# Patient Record
Sex: Female | Born: 1960 | Race: Black or African American | Hispanic: No | Marital: Married | Smoking: Never smoker
Health system: Southern US, Community
[De-identification: ages and names within clinical notes are randomized; demographics above are authoritative.]

## PROBLEM LIST (undated history)

## (undated) DIAGNOSIS — I10 Essential (primary) hypertension: Secondary | ICD-10-CM

## (undated) DIAGNOSIS — G43909 Migraine, unspecified, not intractable, without status migrainosus: Secondary | ICD-10-CM

## (undated) DIAGNOSIS — R7303 Prediabetes: Secondary | ICD-10-CM

## (undated) DIAGNOSIS — E559 Vitamin D deficiency, unspecified: Secondary | ICD-10-CM

## (undated) DIAGNOSIS — E785 Hyperlipidemia, unspecified: Secondary | ICD-10-CM

## (undated) DIAGNOSIS — E669 Obesity, unspecified: Secondary | ICD-10-CM

## (undated) DIAGNOSIS — F32A Depression, unspecified: Secondary | ICD-10-CM

## (undated) DIAGNOSIS — F329 Major depressive disorder, single episode, unspecified: Secondary | ICD-10-CM

## (undated) DIAGNOSIS — T7840XA Allergy, unspecified, initial encounter: Secondary | ICD-10-CM

## (undated) HISTORY — DX: Allergy, unspecified, initial encounter: T78.40XA

## (undated) HISTORY — DX: Prediabetes: R73.03

## (undated) HISTORY — DX: Major depressive disorder, single episode, unspecified: F32.9

## (undated) HISTORY — DX: Vitamin D deficiency, unspecified: E55.9

## (undated) HISTORY — DX: Depression, unspecified: F32.A

## (undated) HISTORY — DX: Migraine, unspecified, not intractable, without status migrainosus: G43.909

## (undated) HISTORY — DX: Obesity, unspecified: E66.9

## (undated) HISTORY — DX: Hyperlipidemia, unspecified: E78.5

## (undated) HISTORY — PX: ABDOMINAL HYSTERECTOMY: SHX81

## (undated) HISTORY — DX: Essential (primary) hypertension: I10

---

## 1998-03-19 ENCOUNTER — Other Ambulatory Visit: Admission: RE | Admit: 1998-03-19 | Discharge: 1998-03-19 | Payer: Self-pay | Admitting: Gynecology

## 1998-05-03 HISTORY — PX: STAPEDECTOMY: SHX2435

## 1998-10-27 ENCOUNTER — Other Ambulatory Visit: Admission: RE | Admit: 1998-10-27 | Discharge: 1998-10-27 | Payer: Self-pay | Admitting: Otolaryngology

## 1998-10-27 ENCOUNTER — Encounter (INDEPENDENT_AMBULATORY_CARE_PROVIDER_SITE_OTHER): Payer: Self-pay | Admitting: Specialist

## 1999-03-24 ENCOUNTER — Other Ambulatory Visit: Admission: RE | Admit: 1999-03-24 | Discharge: 1999-03-24 | Payer: Self-pay | Admitting: Gynecology

## 2000-01-18 ENCOUNTER — Other Ambulatory Visit: Admission: RE | Admit: 2000-01-18 | Discharge: 2000-01-18 | Payer: Self-pay | Admitting: *Deleted

## 2000-03-15 ENCOUNTER — Inpatient Hospital Stay (HOSPITAL_COMMUNITY): Admission: RE | Admit: 2000-03-15 | Discharge: 2000-04-19 | Payer: Self-pay | Admitting: Obstetrics & Gynecology

## 2000-03-15 ENCOUNTER — Encounter: Payer: Self-pay | Admitting: Obstetrics & Gynecology

## 2000-03-18 ENCOUNTER — Encounter: Payer: Self-pay | Admitting: Obstetrics and Gynecology

## 2000-03-21 ENCOUNTER — Encounter: Payer: Self-pay | Admitting: Obstetrics & Gynecology

## 2000-03-28 ENCOUNTER — Encounter: Payer: Self-pay | Admitting: Obstetrics & Gynecology

## 2000-04-12 ENCOUNTER — Encounter: Payer: Self-pay | Admitting: Obstetrics and Gynecology

## 2000-04-15 ENCOUNTER — Encounter: Payer: Self-pay | Admitting: Obstetrics and Gynecology

## 2000-04-20 ENCOUNTER — Encounter: Admission: RE | Admit: 2000-04-20 | Discharge: 2000-05-10 | Payer: Self-pay | Admitting: Obstetrics and Gynecology

## 2000-04-20 ENCOUNTER — Encounter: Admission: RE | Admit: 2000-04-20 | Discharge: 2000-07-19 | Payer: Self-pay | Admitting: Obstetrics and Gynecology

## 2001-04-10 ENCOUNTER — Other Ambulatory Visit: Admission: RE | Admit: 2001-04-10 | Discharge: 2001-04-10 | Payer: Self-pay | Admitting: Gynecology

## 2002-04-11 ENCOUNTER — Other Ambulatory Visit: Admission: RE | Admit: 2002-04-11 | Discharge: 2002-04-11 | Payer: Self-pay | Admitting: Gynecology

## 2003-05-20 ENCOUNTER — Other Ambulatory Visit: Admission: RE | Admit: 2003-05-20 | Discharge: 2003-05-20 | Payer: Self-pay | Admitting: Gynecology

## 2004-06-04 ENCOUNTER — Other Ambulatory Visit: Admission: RE | Admit: 2004-06-04 | Discharge: 2004-06-04 | Payer: Self-pay | Admitting: Gynecology

## 2005-07-20 ENCOUNTER — Other Ambulatory Visit: Admission: RE | Admit: 2005-07-20 | Discharge: 2005-07-20 | Payer: Self-pay | Admitting: Gynecology

## 2005-08-26 ENCOUNTER — Encounter (INDEPENDENT_AMBULATORY_CARE_PROVIDER_SITE_OTHER): Payer: Self-pay | Admitting: *Deleted

## 2005-08-26 ENCOUNTER — Inpatient Hospital Stay (HOSPITAL_COMMUNITY): Admission: RE | Admit: 2005-08-26 | Discharge: 2005-08-28 | Payer: Self-pay | Admitting: *Deleted

## 2007-02-02 ENCOUNTER — Other Ambulatory Visit: Admission: RE | Admit: 2007-02-02 | Discharge: 2007-02-02 | Payer: Self-pay | Admitting: Gynecology

## 2008-05-06 LAB — HM COLONOSCOPY

## 2009-05-06 LAB — HM DEXA SCAN: HM DEXA SCAN: NORMAL

## 2010-01-19 ENCOUNTER — Ambulatory Visit: Payer: Self-pay | Admitting: Internal Medicine

## 2010-01-19 ENCOUNTER — Ambulatory Visit: Payer: Self-pay

## 2010-01-19 ENCOUNTER — Ambulatory Visit (HOSPITAL_COMMUNITY): Admission: RE | Admit: 2010-01-19 | Discharge: 2010-01-19 | Payer: Self-pay | Admitting: Internal Medicine

## 2010-01-20 ENCOUNTER — Encounter: Payer: Self-pay | Admitting: Internal Medicine

## 2010-09-18 NOTE — Discharge Summary (Signed)
[facility]  Patient:    Laura Branch, Laura Branch                      MRN: [id number] Adm. Date:  03/15/00 Disc. Date: 04/19/00 Attending:  Janine Limbo, M.D. Dictator:   Nigel Bridgeman, C.N.M.                           Discharge Summary  ADMITTING DIAGNOSES:          1. Intrauterine pregnancy at 20 weeks.                               2. Premature labor with cervical dilatation.  DISCHARGE DIAGNOSES:          1. At 24 3/7 weeks.                               2. Preterm labor.                               3. Oligohydramnios.                               4. Breech presentation.                               5. Placental abruption.                               6. Fetal distress.                               7. Intrauterine growth retardation.                               8. Chorioamnionitis.                               9. Prolonged hospitalization.  PROCEDURE:                    1. Primary low transverse cesarean section.                               2. General anesthesia.                               3. Magnesium sulfate therapy.                               4. PICC line placement.                               5. Betamethasone therapy.  HOSPITAL COURSE:              Ms. Cancel was a 50 year old gravida 4, para  0-0-3-0 who was admitted on March 15, 2000 at 20 3/7 weeks with dilatation of the cervix to approximately 1.5 cm, 100%, bulging bag of water.  Pregnancy had been remarkable for advanced maternal age, increased risk of Trisomy 18 on AFP (declined amniocentesis), abnormal Pap with a history of cryo, greater than two TABs.  Patient was placed on complete bed rest.  On the day of admission Unasyn antibiotic was begun.  She was treated for bacterial vaginosis in the early days of her treatment.  A PICC line was placed due to anticipated long-term IV therapy needs.  Magnesium sulfate was continued. Cervix was examined digitally on  November 18 and was found to be 3 cm, 90% with bulging membranes.  Cerclage was then declined as an option.  Patient had a negative urine C&S on November 24.  Group B strep culture on November 13 was negative.  She began on December 2 to have some slight amount of bleeding. She began to have sporadic episodes of increased contractions.  Clotting studies were evaluated on December 3 with slight elevation of the fibrin split products.  Reevaluation was held on the sixth with fibrin split products present at 1.46.  Fibrinogen was within normal limits.  Several discussions were held with patient throughout her hospital stay regarding her desire for management plan.  She had elected a cesarean section should fetal distress ensue.  She did have some issues of constipation.  December 9 clotting studies were reevaluated.  There was some elevation of her PT and D-dimer levels. Diagnosis was made of a probable partial abruption.  Ultrasound was performed which showed no measurable cervix, estimated fetal weight less than the 10th percentile, oligohydramnios.  Patient was continued on magnesium sulfate.  On April 15, 2000 she began to have some changes in fetal heart rate.  She began to have heavier bleeding and the onset of abdominal pain.  The decision was made to proceed with cesarean section.  This was performed by Dr. Marline Backbone on April 15, 2000 under general anesthesia secondary to fetal distress.  A complete abruption was documented.  There was a viable female. Weight 1 pound 6 ounces.  Apgars were 1, 5, and 6.  Estimated blood loss was 700 cc.  Infant was intubated and taken to the NICU in critical condition. Patient was taken to the recovery room in good condition.  On postoperative day #1 her clotting studies were within normal limits except for the D-dimer which was 8.32.  Her physical examination was within normal limits.  She had had approximately a month and a half of bed  rest, therefore her mobility was significantly compromised.  Her hemoglobin was 8.8, white blood cell count 14.8.  Patient declined transfusion.  PT consult was held.  Patient did have a temperature to 101.4 on December 15 late in the evening and Cefotan was begun for antibiotic coverage.  By postoperative day #3 patient had been afebrile after a temperature max of 101.5.  Her incision was clean, dry, and intact. Physical therapy was consulted.  Patient was beginning to walk with wheelchair support or walker support.  She was able to bear weight and was able to get herself back and forth to the bathroom.  Dr. Stefano Gaul came in to see the patient on April 19, 2000.  Physical examination was within normal limits. Incision was clean, dry, and intact.  Patient was pumping for breast milk. She had elected Ortho-Tri-Cyclen for contraception which she will start after she stops pumping.  Decision was made that the patient had received the full benefit of her hospital stay and was discharged home.  DISCHARGE INSTRUCTIONS:       Per [facility] handout.  DISCHARGE MEDICATIONS:        1. Motrin 600 mg p.o. q.6h. p.r.n. pain.                               2. Tylox one to two p.o. q.3-4h. p.r.n. pain.                               3. Hemocyte one p.o. b.i.d.                               4. Augmentin 500 mg one p.o. t.i.d. x 8 days.  DISCHARGE FOLLOW-UP:          With physical therapy on April 20, 2000 per order of Dr. Stefano Gaul.  Discharge follow-up will occur at [facility] in six weeks.DD:  04/19/00 TD:  04/19/00 Job: [id number]

## 2010-09-18 NOTE — H&P (Signed)
[facility]  Patient:    Laura Branch, Laura Branch                    MRN: [id number] Adm. Date:  [id number] Attending:  Cleatrice Burke Dictator:   Vance Gather Duplantis, C.N.M.                         History and Physical  HISTORY OF PRESENT ILLNESS:   Laura Branch is a 50 year old married black female, gravida 4, para 0-0-3-0 at 20 weeks by LMP and 20-3/7 weeks by todays ultrasound who presents for:  1. Evaluation from ultrasound department secondary to being found to be anywhere from 1 to 3 cm dilated by ultrasound and 100% effaced.  The patient denies any uterine cramping, pressure, leaking, or bleeding.  She denies any nausea, vomiting, headache, or visual disturbances.  Her pregnancy has been followed at [facility] to date by the CNM service and has been at risk for advanced maternal age with no amniocentesis.  2. History of increased trisomy 18 risk on alpha-fetoprotein, declining amniocentesis.  3. Abnormal pap with a history of cryosurgery in 1991.  4. Greater than two abortions.  OBSTETRIC-GYNECOLOGIC HISTORY:                      She is a gravida 4, para 0-0-3-0 who had a spontaneous AB in 1995 at [redacted] weeks gestation, an elective AB in 1997 at 6 weeks, and a second elective AB in 1999 at 6 weeks.  For gynecologic history, her menarche was at age 75, her last menstrual period was October 27, 1999, giving her an [facility] of August 02, 2000.  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  GENERAL MEDICAL HISTORY:      She has no known drug allergies.  She reports having had the usual childhood diseases.  She has no other medical problems. Her only surgeries include right oophorectomy, exploratory laparoscopy, stapedectomy on her right ear.  FAMILY HISTORY:               Noncontributory though she has a father  with diabetes on oral medication and multiple relatives with different kinds of cancer, colon cancer, lung cancer, and pancreatic cancer.  GENETIC HISTORY:              Negative with the exception that she is over age 83 and declines an amniocentesis.  PRENATAL LABORATORIES:        Not currently available.  SOCIAL HISTORY:               She is married to Laura Branch who is involved and supportive.  They are of the [facility] faith.  They deny any illicit drug use, alcohol, or smoking with this pregnancy.  They are both employed full-time.  PHYSICAL EXAMINATION:  VITAL SIGNS:                  Stable.  She is afebrile.  HEENT:                        Grossly within normal limits.  HEART:                        Regular rhythm and rate.  CHEST:  Clear.  BREASTS:                      Soft and nontender.  ABDOMEN:                      Gravid with the fundal height at her umbilicus. Fetal heart rate is in the 150s.  Uterine contractions are noted approximately every 10 to 12 minutes.  PELVIC:                       Her sterile speculum exam was significant for white, mucusy discharge in the vault that is nitrazine negative but fern positive.  Cervix was about 1.5 cm dilated, 100% effaced, with bulging bag of forewaters noted but not extruding through the cervix.  EXTREMITIES:                  Within normal limits.  LABORATORY DATA:              GC, Chlamydia, group B strep, and wet prep are pending.  CBC ______ differential, urinalysis, and culture are also pending.                                Her ultrasound reveals a single intrauterine pregnancy in breech presentation at 20 weeks and three days with, again, no cervical length noted and dilated anywhere from 0.9 cm to 3 cm with fibroids also noted, 3 x 3 cm for the first one and 3 x 3 also for the second one.  Her amniotic fluid pocket is 4.3 cm.  ASSESSMENT:                   1. Intrauterine pregnancy at  approximately [redacted]                                  weeks gestation by last menstrual period and                                  confirmed by ultrasound today.                               2. Preterm labor versus incompetent cervix and                                  spontaneous rupture of membranes,                                  questionably high leak.  PLAN:                         Admit to labor and delivery, to place her in deep Trendelenburg, to give her Motrin for tocolysis, Unasyn for prophylaxis against Guillain-Barre syndrome, and other orders are on the order sheet per Dr. Elliot Gault. DD:  03/15/00 TD:  03/15/00 Job: [id number]

## 2010-09-18 NOTE — H&P (Signed)
NAME:  Laura Branch, Laura Branch NO.:  [id number]   MEDICAL RECORD NO.:  [id number]          PATIENT TYPE:  AMB   LOCATION:  SDC                           FACILITY:  [facility]   PHYSICIAN:  Almedia Balls. Fore, M.D.   DATE OF BIRTH:  10-22-1960   DATE OF ADMISSION:  08/26/2005  DATE OF DISCHARGE:                                HISTORY & PHYSICAL   CHIEF COMPLAINT:  Abnormal bleeding, pelvic pain, uterine enlargement.   HISTORY:  Patient is a 50 year old gravida 5, para 1 with one premature  birth, two miscarriages, and two spontaneous abortions, who was admitted for  hysterectomy and right salpingo-oophorectomy.  She has had progressively  severe menses and progressively increasing size of her uterus over the past  several years and has been followed by Dr. Chevis Pretty, who referred her to [place] for  definitive surgery.  He has performed Pap smears and endometrial biopsies,  which were normal, recently.  Hemoglobin in March was 12.7.  She has been  counseled as to the nature of the hysterectomy procedure and the risks  involved, including the risk of anesthesia, injury to bowel, bladder, blood  vessels, ureters, postoperative hemorrhage, infection, recuperation, and  hormone replacement following removal of the right ovary, which she desires.  She fully understands all of these considerations and wishes to proceed on  August 26, 2005.   PAST MEDICAL HISTORY:  1.  Removal of an ovarian cyst in 1987.  2.  Removal of her left ovary and tube in June, 1995.  3.  C-section in December, 2001.  4.  Eye surgery in April, 2006.   She takes hydrochlorothiazide 25 mg a day for some mild hypertension and  Zyrtec, Nasonex, and Patanol for allergies.   She is allergic to no medications.   FAMILY HISTORY:  Father and grandmother with diabetes mellitus.  Both  parents with hypertension.  Grandmother with carcinoma of the pancreas and  grandfather with carcinoma of the colon.   REVIEW OF SYSTEMS:   HEENT:  Wear glasses.  Some decrease in hearing.  CARDIORESPIRATORY:  Hypertension, mild, as noted above.  GASTROINTESTINAL:  Negative.  GENITOURINARY:  As noted above.  NEUROMUSCULAR:  Negative.   PHYSICAL EXAMINATION:  VITAL SIGNS:  Height 5 feet 3 and 3/4 inches.  Weight  219 pounds.  Blood pressure 124/70, pulse 80, respirations 18.  GENERAL:  A well-developed black female in no acute distress.  HEENT:  Within normal limits.  NECK:  Supple without masses, adenopathy, or bruits.  LUNGS:  Clear to P&A.  HEART:  Regular rate and rhythm without murmurs.  BREASTS:  Sitting and lying without mass.  Axilla negative.  ABDOMEN:  Flat and soft with vertical and transverse incisions, which are  well healed.  There is no palpable mass except for some fullness in the  central lower abdomen with some tenderness bilaterally, right greater than  left.  PELVIC:  External genitalia, Bartholin, urethra, and Skene glands within  normal limits.  Cervix is slightly inflamed.  Uterus is partially 12-[redacted]  weeks gestation size.  Irregular and tender on manipulation and palpation.  Adnexal exam is tender bilaterally with no palpable masses.  Rectovaginal  confirms.  EXTREMITIES:  Within normal limits.  CENTRAL NERVOUS SYSTEM:  Grossly intact.  SKIN:  Without suspicious lesions.   IMPRESSION:  Abnormal uterine bleeding, pelvic pain, probable fibroids.   DISPOSITION:  As noted above.           ______________________________  Almedia Balls. Randell Patient, M.D.     SRF/MEDQ  D:  08/12/2005  T:  08/12/2005  Job:  [id number]

## 2010-09-18 NOTE — Discharge Summary (Signed)
NAME:  Laura Branch, Laura Branch             ACCOUNT NO.:  [id number]   MEDICAL RECORD NO.:  [id number]          PATIENT TYPE:  INP   LOCATION:  9317                          FACILITY:  [facility]   PHYSICIAN:  Almedia Balls. Fore, M.D.   DATE OF BIRTH:  [birth year]   DATE OF ADMISSION:  08/26/2005  DATE OF DISCHARGE:  08/28/2005                                 DISCHARGE SUMMARY   HISTORY:  The patient is a 50 year old with abnormal uterine bleeding,  uterine enlargement, pelvic pain, status post left oophorectomy and cesarean  section for hysterectomy, right salpingo-oophorectomy at her request on  August 26, 2005. The remainder of her history and physical are as previously  dictated.   Laboratory data include preoperative hemoglobin of 12.7 with MCV low at  75.2, platelets high at 431,000. Potassium somewhat low at 3.2 mEq/L.  Chloride somewhat low at 95 mEq/L.   HOSPITAL COURSE:  The patient was taken to the operating room on August 26, 2005; at which time, abdominal supracervical hysterectomy, right salpingo-  oophorectomy, left salpingectomy, extensive enterolysis and revision of  keloid scar were done. The patient did well postoperatively. Diet and  ambulation were progressed several days postoperatively. She was maintained  in the hospital on August 27, 2005 because of extreme pain which she felt she  could not handle at home.   On the morning of August 28, 2005, she was improved with her pain, afebrile  and experiencing no other problems and it was felt that she could be  discharged.   FINAL DIAGNOSES:  1.  Abnormal uterine bleeding.  2.  Pelvic pain.  3.  Pelvic adhesions, status post left oophorectomy.  4.  Status post cesarean section.  5.  Keloid of abdomen.   OPERATION:  Abdominal supracervical hysterectomy, right salpingo-  oophorectomy, left salpingectomy, extensive enterolysis, revision of keloid.  Pathology report unavailable at the time of dictation.   DISPOSITION:  Discharge to  home to return to the office in two weeks for  follow-up. She was instructed to gradually progress her activities over  several weeks at home and to limit lifting and driving for two weeks. She  was  fully ambulatory, on a regular diet and in good condition at the time of  discharge. She was given a prescription for Percocet 10/325 mg #30 to be  taken 1 q.4-6h. p.r.n. pain and doxycycline 100 mg #12 to be taken 1 b.i.d.  as well as Vivelle 0.05 patches for two weeks with samples provided to be  changed twice a week.           ______________________________  Almedia Balls. Randell Patient, M.D.     SRF/MEDQ  D:  08/28/2005  T:  08/29/2005  Job:  [id number]   cc:   Leatha Gilding. Mezer, M.D.  Fax: [phone]

## 2010-09-18 NOTE — Op Note (Signed)
[facility]  Patient:    OTELIA, HETTINGER                    MRN: [id number] Proc. Date: 04/15/00 Adm. Date:  [id number] Attending:  Dierdre Forth Pearline                           Operative Report  PREOPERATIVE DIAGNOSIS:       24-1/[redacted] week gestation.  Preterm labor. Oligohydramnios.  Breech presentation.  Placental abruption.  Nonreassuring fetal heart rate tracing. Intrauterine growth retardation.  POSTOPERATIVE DIAGNOSIS:      24-1/[redacted] week gestation.  Preterm labor. Oligohydramnios.  Breech presentation.  Placental abruption.  Nonreassuring fetal heart rate tracing.  Intrauterine growth retardation.  OPERATION:                    Stat primary low transverse cesarean section.  SURGEON:                      Janine Limbo, M.D.  ASSISTANT:                    Miguel Dibble, C.N.M.  ANESTHESIA:                   General anesthesia.  ESTIMATED BLOOD LOSS:  INDICATIONS:                  Ms. Luepke is a 50 year old female, gravida 4, para 0-0-3-0, who was admitted to [facility] [place] on March 15, 2000, with preterm labor.  She has been in Trendelenburg position since that time.  She was given magnesium, ibuprofen, and Terbutaline to arrest her labor.  She has received betamethasone.  The patient has had uterine bleeding and then on this day the patient had much heavier bleeding and the onset of abdominal pain.  An ultrasound has confirmed oligohydramnios as well as intrauterine growth retardation.  We discussed the plan for delivery prior to today and we confirmed the patients wishes when the patient began having heavier bleeding.  She requested that we proceed immediately to cesarean delivery.  FINDINGS:  The weight of the infant is currently not known, although, my estimate for the weight is approximately 1-1/4 pounds.  The apgars of the infant are currently not known.  The infant was intubated immediately and taken to  the neonatal intensive care unit via the isolette.  There was a complete placental abruption present.  DESCRIPTION OF PROCEDURE:     The patient was taken to the operating room where a general anesthesia was given.  The patients abdomen was prepped with multiple layers of Betadine.  A Foley catheter had previously been placed. The patient was sterilely draped.  After the adequate induction of her general anesthesia, a low transverse incision was made in the abdomen and carried sharply through the subcutaneous tissue, the fascia, and the anterior peritoneum.  An incision was made in the lower uterine segment and extended transversely.  A complete placental abruption was encountered.  The infant was delivered without difficulty.  The cord was clamped and cut and the infant was handed to the awaiting pediatric team.  The placenta was removed.  The uterine cavity was cleaned of amniotic fluid and clotted blood.  The uterine incision was closed using a running locking suture of 2-0 Vicryl.  Hemostasis was adequate.  The pericolonic gutters were cleaned of  amniotic fluid and clotted blood.  The pelvis was irrigated.  The anterior peritoneum and the abdominal musculature were reapproximated in the midline.  The fascia was closed using a running suture of 0 Vicryl followed by three interrupted sutures of 0 Vicryl. The skin was reapproximated using skin staples.  Sponge, needle, and instrument counts were correct x 2 occasions.  The estimated blood loss was 700 cc.  The patient tolerated her procedure well.  The patient was awakened from her anesthetic and taken to the recovery room in stable condition.  The infant was taken to the intensive care nursery in guarded condition.  The patient was noted to drain clear, yellow urine at the end of our procedure. DD:  04/15/00 TD:  04/16/00 Job: [id number]

## 2010-09-18 NOTE — Op Note (Signed)
NAME:  Laura Branch, Laura Branch NO.:  [id number]   MEDICAL RECORD NO.:  [id number]          PATIENT TYPE:  INP   LOCATION:  9399                          FACILITY:  [facility]   PHYSICIAN:  Almedia Balls. Fore, M.D.   DATE OF BIRTH:  [birth year]   DATE OF PROCEDURE:  08/26/2005  DATE OF DISCHARGE:                                 OPERATIVE REPORT   PREOPERATIVE DIAGNOSIS:  Abnormal uterine bleeding, pelvic pain, slight  uterine enlargement, status post left oophorectomy and cesarean section.   POSTOPERATIVE DIAGNOSIS:  Abnormal uterine bleeding, pelvic pain, slight  uterine enlargement, status post left oophorectomy and cesarean section  pending pathology.   OPERATION:  Abdominal supracervical hysterectomy, right salpingo-  oophorectomy, left salpingectomy, enterolysis, revision of keloid.   ANESTHESIA:  General orotracheal.   SURGEON:  Almedia Balls. Randell Patient, M.D.   FIRST ASSISTANT:  Gretta Cool, M.D.   INDICATIONS FOR SURGERY:  The patient is a 50 year old with the above noted  problems who has been counseled as to the need for surgery to correct these  problems and the type surgery to be performed.  She fully understands all  these considerations which include the risks of anesthesia, injury to bowel,  bladder blood vessels, ureters, postoperative hemorrhage, infection,  recuperation, use of hormone replacement following removal of her remaining  ovary.  She does understand all these considerations and has signed informed  consent to proceed on August 26, 2005.   OPERATIVE FINDINGS:  On entry into the abdomen, there were noted to be  adhesions involving loops of bowel to the anterior peritoneal surface as  well as omental adhesions.  In the pelvis, the uterus was slightly enlarged.  The left ovary was previously surgically absent.  There were adhesions  involving loops of bowel to the lateral peritoneal surface and to the uterus  itself.  In the anterior portion of the uterus,  the peritoneum was quite  distorted overlying the bladder reflection, probably secondary to previous C-  section.  Exploration of the upper abdomen revealed the lower liver edge,  gallbladder, spleen, kidneys, periaortic areas, and appendix to be normal to  palpation and/or visualization.   PROCEDURE:  With the patient under general anesthesia, prepped and draped in  the usual sterile fashion with the Foley catheter in the bladder, a lower  abdominal vertical incision was made after excision of a previous surgical  keloid.  This incision was carried into the peritoneal cavity where  adhesions were encountered.  These were lysed using Bovie electrocautery  coagulation and sharp dissection.  It was then possible to place a self-  retaining retractor.  Further lysis of adhesions involving loops of bowel  and pelvic viscera was necessary; this was accomplished using Bovie  electrocoagulation and sharp dissection, as well.  These adhesions were  quite extensive on the left side and in the anterior lower uterine segment  areas.  It was then possible to place Kelly clamps across the tubes and  round ligaments bilaterally with a clamp across the utero-ovarian attachment  on the right.  The round ligaments were transected using Bovie  electrocoagulation with entry into the retroperitoneal space and development  of a bladder flap anteriorly and dissection of the adhesions off the  anterior surface of the uterus.  The infundibulopelvic ligament on the right  was then identified and isolated, clamped, cut, and doubly ligated with 0  Vicryl for removal of right tube and ovary, as the patient had requested.  The left tube was quite adherent to the posterolateral peritoneal surface  and the posterior surface of the uterus.  Adhesions in this area were then  lysed and the uterine vessels bilaterally were then skeletonized, clamped,  cut, and suture ligated with 0 Vicryl.  The cardinal ligaments  bilaterally  were then clamped, cut, and suture ligated with 0 Vicryl.  It was then  possible to excise the lower uterine segment and endocervix from the  cervical stump using Bovie electrocoagulation.  The remaining portion of the  endocervix was cauterized extensively with Bovie electrocoagulation.  The  cervical stump was then reapproximated and rendered hemostatic with  interrupted figure-of-eight sutures of 0 Vicryl.  The area was lavaged with  copious amounts of lactated Ringer solution and after noting hemostasis was  maintained, the area was reperitonealized using a continuous suture of 3-0  PDS.  With the correct sponge and instrument count and good hemostasis, the  peritoneum was closed with a continuous suture of 0 Vicryl.  The fascia was  closed with two sutures of 0 PDS which were brought from the upper and lower  aspects of the incision and tied in the midline.  The subcutaneous fat was  reapproximated with interrupted horizontal mattress sutures of 0 Vicryl.  The skin was closed with a subcuticular suture of 3-0 plain catgut and Steri-  Strips.  Estimated blood loss 150 mL.  The patient was taken to recovery  room in good condition.  She will be placed on 23-hour observation following  surgery.           ______________________________  Almedia Balls Randell Patient, M.D.     SRF/MEDQ  D:  08/26/2005  T:  08/26/2005  Job:  [id number]   cc:   Leatha Gilding. Mezer, M.D.  Fax: [phone]   Gretta Cool, M.D.  Fax: [phone]

## 2011-05-07 LAB — HM MAMMOGRAPHY: HM Mammogram: NEGATIVE

## 2011-05-07 LAB — HM PAP SMEAR: HM Pap smear: NEGATIVE

## 2012-09-07 ENCOUNTER — Other Ambulatory Visit: Payer: Self-pay | Admitting: Endodontics

## 2013-05-06 ENCOUNTER — Encounter: Payer: Self-pay | Admitting: Physician Assistant

## 2013-05-06 DIAGNOSIS — F329 Major depressive disorder, single episode, unspecified: Secondary | ICD-10-CM | POA: Insufficient documentation

## 2013-05-06 DIAGNOSIS — E559 Vitamin D deficiency, unspecified: Secondary | ICD-10-CM

## 2013-05-06 DIAGNOSIS — R7303 Prediabetes: Secondary | ICD-10-CM

## 2013-05-06 DIAGNOSIS — E785 Hyperlipidemia, unspecified: Secondary | ICD-10-CM

## 2013-05-06 DIAGNOSIS — F32A Depression, unspecified: Secondary | ICD-10-CM

## 2013-05-06 DIAGNOSIS — R7309 Other abnormal glucose: Secondary | ICD-10-CM | POA: Insufficient documentation

## 2013-05-06 DIAGNOSIS — G43909 Migraine, unspecified, not intractable, without status migrainosus: Secondary | ICD-10-CM

## 2013-05-06 DIAGNOSIS — I10 Essential (primary) hypertension: Secondary | ICD-10-CM

## 2013-05-09 ENCOUNTER — Ambulatory Visit (INDEPENDENT_AMBULATORY_CARE_PROVIDER_SITE_OTHER): Payer: BC Managed Care – PPO | Admitting: Physician Assistant

## 2013-05-09 ENCOUNTER — Encounter: Payer: Self-pay | Admitting: Physician Assistant

## 2013-05-09 VITALS — BP 128/80 | HR 64 | Temp 97.3°F | Resp 16 | Ht 63.0 in | Wt 242.0 lb

## 2013-05-09 DIAGNOSIS — R7303 Prediabetes: Secondary | ICD-10-CM

## 2013-05-09 DIAGNOSIS — Z79899 Other long term (current) drug therapy: Secondary | ICD-10-CM

## 2013-05-09 DIAGNOSIS — I10 Essential (primary) hypertension: Secondary | ICD-10-CM

## 2013-05-09 DIAGNOSIS — E559 Vitamin D deficiency, unspecified: Secondary | ICD-10-CM

## 2013-05-09 DIAGNOSIS — R7309 Other abnormal glucose: Secondary | ICD-10-CM

## 2013-05-09 DIAGNOSIS — E782 Mixed hyperlipidemia: Secondary | ICD-10-CM

## 2013-05-09 DIAGNOSIS — E785 Hyperlipidemia, unspecified: Secondary | ICD-10-CM

## 2013-05-09 MED ORDER — PHENTERMINE HCL 37.5 MG PO TABS: 37.5000 mg | ORAL_TABLET | Freq: Every day | ORAL | Status: DC

## 2013-05-09 MED ORDER — AZITHROMYCIN 250 MG PO TABS: ORAL_TABLET | ORAL | Status: AC

## 2013-05-09 NOTE — Patient Instructions (Addendum)
To taper the topamax please decrease by one pill once a week until you are down to one pill at night, then do it every other night for one week and stop.   The majority of colds are caused by viruses and do not require antibiotics. Please read the rest of this hand out to learn more about the common cold and what you can do to help yourself as well as help prevent the over use of antibiotics.   COMMON COLD SIGNS AND SYMPTOMS - The common cold usually causes nasal congestion, runny nose, and sneezing. A sore throat may be present on the first day but usually resolves quickly. If a cough occurs, it generally develops on about the fourth or fifth day of symptoms, typically when congestion and runny nose are resolving  COMMON COLD COMPLICATIONS - In most cases, colds do not cause serious illness or complications. Most colds last for three to seven days, although many people continue to have symptoms (coughing, sneezing, congestion) for up to two weeks.  One of the more common complications is sinusitis, which is usually caused by viruses and rarely (about 2 percent of the time) by bacteria. Having thick or yellow to green-colored nasal discharge does not mean that bacterial sinusitis has developed; discolored nasal discharge is a normal phase of the common cold.  Lower respiratory infections, such as pneumonia or bronchitis, may develop following a cold.  Infection of the middle ear, or otitis media, can accompany or follow a cold.  COMMON COLD TREATMENT - There is no specific treatment for the viruses that cause the common cold. Most treatments are aimed at relieving some of the symptoms of the cold, but do not shorten or cure the cold. Antibiotics are not useful for treating the common cold; antibiotics are only used to treat illnesses caused by bacteria, not viruses. Unnecessary use of antibiotics for the treatment of the common cold can cause allergic reactions, diarrhea, or other gastrointestinal  symptoms in some patients.  The symptoms of a cold will resolve over time, even without any treatment. People with underlying medical conditions and those who use other over-the-counter or prescription medications should speak with their healthcare provider or pharmacist to ensure that it is safe to use these treatments. The following are treatments that may reduce the symptoms caused by the common cold.  Nasal congestion - Decongestants are good for nasal congestion- if you feel very stuffy but no mucus is coming out, this is the medication that will help you the most.  Pseudoephedrine is a decongestant that can improve nasal congestion. Although a prescription is not required, drugstores in the [place] keep pseudoephedrine behind the counter, so it must be requested from a pharmacist. If you have a heart condition or high blood pressure please use Coricidin BPH instead.   Runny nose - Antihistamines such as diphenhydramine (Benadryl), certazine (Zyrtec) which are best taking at night because they can make you tired OR loratadine (Claritin),  fexafinadine (Allegra) help with a runny nose.   Nasal sprays such an oxymetazoline (Afrin and others) may also give temporary relief of nasal congestion. However, these sprays should never be used for more than two to three days; use for more than three days use can worsen congestion.  Nasocort is now over the counter and can help decrease a runny nose. Please stop the medication if you have blurry vision or nose bleeds.   Sore throat and headache - Sore throat and headache are best treated with a  mild pain reliever such as acetaminophen (Tylenol) or a non-steroidal anti-inflammatory agent such as ibuprofen or naproxen (Motrin or Aleve). These medications should be taken with food to prevent stomach problems. As well as gargling with warm water and salt.   Cough - Common cough medicine ingredients include guaifenesin and dextromethorphan; these are often  combined with other medications in over-the-counter cold formulas. Often a cough is worse at night or first in the morning due to post nasal drip from you nose. You can try to sleep at an angle to decrease a cough.   Alternative treatments - Heated, humidified air can improve symptoms of nasal congestion and runny nose, and causes few to no side effects. A number of alternative products, including vitamin C, doubling up on your vitamin D and herbal products such as echinacea, may help. Certain products, such as nasal gels that contain zinc (eg, Zicam), have been associated with a permanent loss of smell.  Antibiotics - Antibiotics should not be used to treat an uncomplicated common cold. As noted above, colds are caused by viruses. Antibiotics treat bacterial, not viral infections. Some viruses that cause the common cold can also depress the immune system or cause swelling in the lining of the nose or airways; this can, in turn, lead to a bacterial infection. Often you need to give your body 7 days to fight off a common cold while treating the symptoms with the medications listed above. If after 7 days your symptoms are not improving, you are getting worse, you have shortness of breath, chest pain, a fever of over 103 you should seek medical help immediately.   PREVENTION IS THE BEST MEDICINE - Hand washing is an essential and highly effective way to prevent the spread of infection.  Alcohol-based hand rubs are a good alternative for disinfecting hands if a sink is not available.  Hands should be washed before preparing food and eating and after coughing, blowing the nose, or sneezing. While it is not always possible to limit contact with people who may be infected with a cold, touching the eyes, nose, or mouth after direct contact should be avoided when possible. Sneezing/coughing into the sleeve of one's clothing (at the inner elbow) is another means of containing sprays of saliva and secretions and does  not contaminate the hands.    For the phenteramine- start 1/2 pill in the morning you can even go to 1/3 of a pill. If you have fast heart rate or chest pain stop it. It can make you feel anxious as well.

## 2013-05-09 NOTE — Progress Notes (Signed)
HPI Patient presents for 3 month follow up with hypertension, hyperlipidemia, prediabetes and vitamin D. Patient's blood pressure has been controlled at home, today their BP is BP: 128/80 mmHg  Patient denies chest pain, shortness of breath, dizziness.  Patient's cholesterol is diet controlled. In addition they are on zocor and denies myalgias. The cholesterol last visit was LDL 149 (116)  The patient has been working on diet and exercise for prediabetes, and denies changes in vision, polys, and paresthesias. A1C 6.3(5.9) Patient is on Vitamin D supplement.   Feel tomapax is not longer helping and would like to get off of it.  Has had sinus problems for 2 weeks. She has been using nasonex and NSAIDS. She has sinus pressure, headache, green mucus, denies teeth pain, fever, chills.   Current Medications:  Current Outpatient Prescriptions on File Prior to Visit  Medication Sig Dispense Refill  . aspirin 81 MG tablet Take 81 mg by mouth daily.      Marland Kitchen azelastine (ASTELIN) 137 MCG/SPRAY nasal spray Place 2 sprays into both nostrils 2 (two) times daily. Use in each nostril as directed      . cholecalciferol (VITAMIN D) 1000 UNITS tablet Take 5,000 Units by mouth daily.      . Ferrous Sulfate Dried (SLOW IRON PO) Take by mouth.      . hydrochlorothiazide (HYDRODIURIL) 25 MG tablet Take 25 mg by mouth daily.      . Magnesium 250 MG TABS Take by mouth.      . simvastatin (ZOCOR) 40 MG tablet Take 40 mg by mouth daily.      Marland Kitchen topiramate (TOPAMAX) 25 MG tablet Take 25 mg by mouth 2 (two) times daily. 1-3 pills at bedtime       No current facility-administered medications on file prior to visit.   Medical History:  Past Medical History  Diagnosis Date  . Hyperlipidemia   . Allergy   . Depression   . Migraines   . Vitamin D deficiency   . Prediabetes   . Hypertension     Echo 2011 normal EF, trace MR   Allergies:  Allergies  Allergen Reactions  . Singulair [Montelukast Sodium] Swelling  .  Lactose Intolerance (Gi)     ROS Constitutional: Denies fever, chills, headaches, insomnia, fatigue, night sweats Eyes: Denies redness, blurred vision, diplopia, discharge, itchy, watery eyes.  ENT: + congestion, sinus pain  Denies post nasal drip, sore throat, earache, dental pain, Tinnitus, Vertigo,  snoring.  Cardio: Denies chest pain, palpitations, irregular heartbeat, dyspnea, diaphoresis, orthopnea, PND, claudication, edema Respiratory: denies cough, shortness of breath, wheezing.  Gastrointestinal: Denies dysphagia, heartburn, AB pain/ cramps, N/V, diarrhea, constipation, hematemesis, melena, hematochezia,  hemorrhoids Genitourinary: Denies dysuria, frequency, urgency, nocturia, hesitancy, discharge, hematuria, flank pain Musculoskeletal: Denies myalgia, stiffness, pain, swelling and strain/sprain. Skin: Denies pruritis, rash, changing in skin lesion Neuro: Denies Weakness, tremor, incoordination, spasms, pain Psychiatric: Denies confusion, memory loss, sensory loss Endocrine: Denies change in weight, skin, hair change, nocturia Diabetic Polys, Denies visual blurring, hyper /hypo glycemic episodes, and paresthesia, Heme/Lymph: Denies Excessive bleeding, bruising, enlarged lymph nodes  Family history- Review and unchanged Social history- Review and unchanged Physical Exam: Filed Vitals:   05/09/13 1559  BP: 128/80  Pulse: 64  Temp: 97.3 F (36.3 C)  Resp: 16   Filed Weights   05/09/13 1559  Weight: 242 lb (109.77 kg)   General Appearance: Well nourished, in no apparent distress. Eyes: PERRLA, EOMs, conjunctiva no swelling or erythema Sinuses: + Frontal/maxillary tenderness  ENT/Mouth: Ext aud canals clear, TMs without erythema, bulging. No erythema, swelling, or exudate on post pharynx.  Tonsils not swollen or erythematous. Hearing normal.  Neck: Supple, thyroid normal.  Respiratory: Respiratory effort normal, BS equal bilaterally without rales, rhonchi, wheezing or  stridor.  Cardio: RRR with no MRGs. Brisk peripheral pulses without edema.  Abdomen: Soft, + BS.  Non tender, no guarding, rebound, hernias, masses. Lymphatics: Non tender without lymphadenopathy.  Musculoskeletal: Full ROM, 5/5 strength, normal gait.  Skin: Warm, dry without rashes, lesions, ecchymosis.  Neuro: Cranial nerves intact. Normal muscle tone, no cerebellar symptoms. Sensation intact.  Psych: Awake and oriented X 3, normal affect, Insight and Judgment appropriate.   Assessment and Plan:  Hypertension: Continue medication, monitor blood pressure at home.  Continue DASH diet. Cholesterol: Continue diet and exercise. Check cholesterol.  Pre-diabetes-Continue diet and exercise. Check A1C Vitamin D Def- check level and continue medications.  obesity phentermine 37.5- follow up on month.  Sinusitis- zpak  Continue diet and meds as discussed. Further disposition pending results of labs.  Quentin Mullingollier, Tayte Childers 4:10 PM

## 2013-05-10 ENCOUNTER — Other Ambulatory Visit: Payer: Self-pay | Admitting: Physician Assistant

## 2013-05-10 LAB — HEPATIC FUNCTION PANEL
ALBUMIN: 4.1 g/dL (ref 3.5–5.2)
ALT: 13 U/L (ref 0–35)
AST: 14 U/L (ref 0–37)
Alkaline Phosphatase: 68 U/L (ref 39–117)
Bilirubin, Direct: 0.1 mg/dL (ref 0.0–0.3)
Indirect Bilirubin: 0.3 mg/dL (ref 0.0–0.9)
TOTAL PROTEIN: 7.3 g/dL (ref 6.0–8.3)
Total Bilirubin: 0.4 mg/dL (ref 0.3–1.2)

## 2013-05-10 LAB — TSH: TSH: 0.601 u[IU]/mL (ref 0.350–4.500)

## 2013-05-10 LAB — CBC WITH DIFFERENTIAL/PLATELET
Basophils Absolute: 0 10*3/uL (ref 0.0–0.1)
Basophils Relative: 1 % (ref 0–1)
EOS ABS: 0.5 10*3/uL (ref 0.0–0.7)
EOS PCT: 9 % — AB (ref 0–5)
HCT: 39.9 % (ref 36.0–46.0)
HEMOGLOBIN: 13.2 g/dL (ref 12.0–15.0)
Lymphocytes Relative: 40 % (ref 12–46)
Lymphs Abs: 2.2 10*3/uL (ref 0.7–4.0)
MCH: 27.6 pg (ref 26.0–34.0)
MCHC: 33.1 g/dL (ref 30.0–36.0)
MCV: 83.5 fL (ref 78.0–100.0)
MONOS PCT: 6 % (ref 3–12)
Monocytes Absolute: 0.4 10*3/uL (ref 0.1–1.0)
Neutro Abs: 2.5 10*3/uL (ref 1.7–7.7)
Neutrophils Relative %: 44 % (ref 43–77)
PLATELETS: 319 10*3/uL (ref 150–400)
RBC: 4.78 MIL/uL (ref 3.87–5.11)
RDW: 14.5 % (ref 11.5–15.5)
WBC: 5.6 10*3/uL (ref 4.0–10.5)

## 2013-05-10 LAB — BASIC METABOLIC PANEL WITH GFR
BUN: 11 mg/dL (ref 6–23)
CALCIUM: 9.5 mg/dL (ref 8.4–10.5)
CO2: 28 mEq/L (ref 19–32)
CREATININE: 0.86 mg/dL (ref 0.50–1.10)
Chloride: 104 mEq/L (ref 96–112)
GFR, Est African American: >89 mL/min
GFR, Est Non African American: 78 mL/min
GLUCOSE: 88 mg/dL (ref 70–99)
Potassium: 4.3 mEq/L (ref 3.5–5.3)
SODIUM: 140 meq/L (ref 135–145)

## 2013-05-10 LAB — HEMOGLOBIN A1C
HEMOGLOBIN A1C: 6.2 % — AB (ref <5.7)
MEAN PLASMA GLUCOSE: 131 mg/dL — AB (ref <117)

## 2013-05-10 LAB — LIPID PANEL
CHOLESTEROL: 238 mg/dL — AB (ref 0–200)
HDL: 55 mg/dL (ref >39)
LDL Cholesterol: 165 mg/dL — ABNORMAL HIGH (ref 0–99)
TRIGLYCERIDES: 92 mg/dL (ref <150)
Total CHOL/HDL Ratio: 4.3 Ratio
VLDL: 18 mg/dL (ref 0–40)

## 2013-05-10 LAB — INSULIN, FASTING: INSULIN FASTING, SERUM: 36 u[IU]/mL — AB (ref 3–28)

## 2013-05-10 LAB — MAGNESIUM: Magnesium: 1.8 mg/dL (ref 1.5–2.5)

## 2013-05-10 LAB — VITAMIN D 25 HYDROXY (VIT D DEFICIENCY, FRACTURES): VIT D 25 HYDROXY: 31 ng/mL (ref 30–89)

## 2013-06-01 ENCOUNTER — Ambulatory Visit (INDEPENDENT_AMBULATORY_CARE_PROVIDER_SITE_OTHER): Payer: BC Managed Care – PPO | Admitting: Physician Assistant

## 2013-06-01 ENCOUNTER — Encounter: Payer: Self-pay | Admitting: Physician Assistant

## 2013-06-01 VITALS — BP 128/80 | HR 88 | Temp 97.5°F | Resp 16 | Ht 63.0 in | Wt 230.0 lb

## 2013-06-01 DIAGNOSIS — E785 Hyperlipidemia, unspecified: Secondary | ICD-10-CM

## 2013-06-01 DIAGNOSIS — E669 Obesity, unspecified: Secondary | ICD-10-CM

## 2013-06-01 DIAGNOSIS — R7309 Other abnormal glucose: Secondary | ICD-10-CM

## 2013-06-01 DIAGNOSIS — R7303 Prediabetes: Secondary | ICD-10-CM

## 2013-06-01 MED ORDER — PHENTERMINE HCL 37.5 MG PO TABS: 37.5000 mg | ORAL_TABLET | Freq: Every day | ORAL | Status: DC

## 2013-06-01 NOTE — Progress Notes (Signed)
HPI Patient presents for a one month follow up for obesity with co morbidities of prediabetes, hypertension and hyperlipidemia. . Her last visit she was started on Phentermine and had extensive weight loss counseling. Body mass index is 40.75 kg/(m^2).  Her last weight was 242 and she is now at 230 in the office. She states she is tolerating the medication, and only taking 1/2 a pill in the morning, she has had constipation and dry mouth which she is increasing water and taking miralax PRN. She denies hypertension, palpitations, anxiety. She has since started a dance class and walking. She states her diet has been going well. She eats yogurt in the morning or oatmeal, lunch is normal soup or salad, and dinner is broiled meat. Will snack on Banana or apple. She is no longer drinking juice or sodas. She has had some insomnia but this was previous to the medication. No trouble falling asleep but she does wake up several times in the night.   Denies CP, SOB, nausea, dizziness, palpitations.    Pertinenet labs: Lab Results  Component Value Date   CHOL 238* 05/09/2013   HDL 55 05/09/2013   LDLCALC 161* 05/09/2013   TRIG 92 05/09/2013   CHOLHDL 4.3 05/09/2013   Lab Results  Component Value Date   HGBA1C 6.2* 05/09/2013   Wt Readings from Last 3 Encounters:  06/01/13 230 lb (104.327 kg)  05/09/13 242 lb (109.77 kg)    Past Medical History  Diagnosis Date  . Hyperlipidemia   . Allergy   . Depression   . Migraines   . Vitamin D deficiency   . Prediabetes   . Hypertension     Echo 2011 normal EF, trace MR     Allergies  Allergen Reactions  . Singulair [Montelukast Sodium] Swelling  . Lactose Intolerance (Gi)       Current Outpatient Prescriptions on File Prior to Visit  Medication Sig Dispense Refill  . aspirin 81 MG tablet Take 81 mg by mouth daily.      Marland Kitchen azelastine (ASTELIN) 137 MCG/SPRAY nasal spray Place 2 sprays into both nostrils 2 (two) times daily. Use in each nostril as directed       . cholecalciferol (VITAMIN D) 1000 UNITS tablet Take 5,000 Units by mouth daily.      Marland Kitchen estradiol (MINIVELLE) 0.075 MG/24HR Place 1 patch onto the skin 2 (two) times a week.      . Ferrous Sulfate Dried (SLOW IRON PO) Take by mouth.      . hydrochlorothiazide (HYDRODIURIL) 25 MG tablet Take 25 mg by mouth daily.      . Magnesium 250 MG TABS Take by mouth.      . phentermine (ADIPEX-P) 37.5 MG tablet Take 1 tablet (37.5 mg total) by mouth daily before breakfast.  30 tablet  0  . simvastatin (ZOCOR) 40 MG tablet TAKE 1/2 TABLET BY MOUTH EVERY NIGHT AT BEDTIME  30 tablet  1  . topiramate (TOPAMAX) 25 MG tablet Take 25 mg by mouth 2 (two) times daily. 1-3 pills at bedtime       No current facility-administered medications on file prior to visit.    ROS: all negative expect above.   Physical: Filed Weights   06/01/13 0831  Weight: 230 lb (104.327 kg)   Filed Vitals:   06/01/13 0831  BP: 128/80  Pulse: 88  Temp: 97.5 F (36.4 C)  Resp: 16   General Appearance: Well nourished, in no apparent distress. Eyes: PERRLA, EOMs. Sinuses: No  Frontal/maxillary tenderness ENT/Mouth: Ext aud canals clear, normal light reflex with TMs without erythema, bulging. Post pharynx without erythema, swelling, exudate. Crowded mouth. Respiratory: CTAB Cardio: RRR, no murmurs, rubs or gallops. Peripheral pulses brisk and equal bilaterally, without edema. No aortic or femoral bruits. Abdomen: Soft, obese, with bowl sounds. Nontender, no guarding, rebound. Lymphatics: Non tender without lymphadenopathy.  Musculoskeletal: Full ROM all peripheral extremities, 5/5 strength, and normal gait. Skin: Warm, dry without rashes, lesions, ecchymosis.  Neuro: Cranial nerves intact, reflexes equal bilaterally. Normal muscle tone, no cerebellar symptoms. Sensation intact.  Pysch: Awake and oriented X 3, normal affect, Insight and Judgment appropriate.   Assessment and Plan: Obesity with comorbid conditions-  She does  not want to get on a medication for her cholesterol at this she will add benefiber.   Continue Phentermine, BP, HR are okay and no side effects  Insomnia- she continues to have trouble sleeping despite good sleep hygiene.   She will go to sleep easily but she wakes up 3-4 hours later.   Very crowded mouth, likely sleep apnea  She does not want to be treated at this time, suggest sleeping on side, zyrtec/nasal spray at night and cont weight loss.

## 2013-06-01 NOTE — Patient Instructions (Addendum)
PLEASE ADD BENEFIBER 1-2 TBSP IN THE MORNING  Sleep Apnea  Sleep apnea is a sleep disorder characterized by abnormal pauses in breathing while you sleep. When your breathing pauses, the level of oxygen in your blood decreases. This causes you to move out of deep sleep and into light sleep. As a result, your quality of sleep is poor, and the system that carries your blood throughout your body (cardiovascular system) experiences stress. If sleep apnea remains untreated, the following conditions can develop:  High blood pressure (hypertension).  Coronary artery disease.  Inability to achieve or maintain an erection (impotence).  Impairment of your thought process (cognitive dysfunction). There are three types of sleep apnea: 1. Obstructive sleep apnea Pauses in breathing during sleep because of a blocked airway. 2. Central sleep apnea Pauses in breathing during sleep because the area of the brain that controls your breathing does not send the correct signals to the muscles that control breathing. 3. Mixed sleep apnea A combination of both obstructive and central sleep apnea. RISK FACTORS The following risk factors can increase your risk of developing sleep apnea:  Being overweight.  Smoking.  Having narrow passages in your nose and throat.  Being of older age.  Being female.  Alcohol use.  Sedative and tranquilizer use.  Ethnicity. Among individuals younger than 35 years, African Americans are at increased risk of sleep apnea. SYMPTOMS   Difficulty staying asleep.  Daytime sleepiness and fatigue.  Loss of energy.  Irritability.  Loud, heavy snoring.  Morning headaches.  Trouble concentrating.  Forgetfulness.  Decreased interest in sex. DIAGNOSIS  In order to diagnose sleep apnea, your caregiver will perform a physical examination. Your caregiver may suggest that you take a home sleep test. Your caregiver may also recommend that you spend the night in a sleep lab. In  the sleep lab, several monitors record information about your heart, lungs, and brain while you sleep. Your leg and arm movements and blood oxygen level are also recorded. TREATMENT The following actions may help to resolve mild sleep apnea:  Sleeping on your side.   Using a decongestant if you have nasal congestion.   Avoiding the use of depressants, including alcohol, sedatives, and narcotics.   Losing weight and modifying your diet if you are overweight. There also are devices and treatments to help open your airway:  Oral appliances. These are custom-made mouthpieces that shift your lower jaw forward and slightly open your bite. This opens your airway.  Devices that create positive airway pressure. This positive pressure "splints" your airway open to help you breathe better during sleep. The following devices create positive airway pressure:  Continuous positive airway pressure (CPAP) device. The CPAP device creates a continuous level of air pressure with an air pump. The air is delivered to your airway through a mask while you sleep. This continuous pressure keeps your airway open.  Nasal expiratory positive airway pressure (EPAP) device. The EPAP device creates positive air pressure as you exhale. The device consists of single-use valves, which are inserted into each nostril and held in place by adhesive. The valves create very little resistance when you inhale but create much more resistance when you exhale. That increased resistance creates the positive airway pressure. This positive pressure while you exhale keeps your airway open, making it easier to breath when you inhale again.  Bilevel positive airway pressure (BPAP) device. The BPAP device is used mainly in patients with central sleep apnea. This device is similar to the CPAP device  because it also uses an air pump to deliver continuous air pressure through a mask. However, with the BPAP machine, the pressure is set at two  different levels. The pressure when you exhale is lower than the pressure when you inhale.  Surgery. Typically, surgery is only done if you cannot comply with less invasive treatments or if the less invasive treatments do not improve your condition. Surgery involves removing excess tissue in your airway to create a wider passage way. Document Released: 04/09/2002 Document Revised: 08/14/2012 Document Reviewed: 08/26/2011 [facility] Patient Information 2014 [place], [place].  Phentermine  While taking the medication we will ask that you come into the office once a month to monitor your weight, blood pressure, and heart rate. In addition we can help answer your questions about diet, exercise, and help you every step of the way with your weight loss journey. Sometime it is helpful if you bring in a food diary or use an app on your phone such as myfitnesspal to record your calorie intake, especially in the beginning.   What is this medicine? PHENTERMINE (FEN ter meen) decreases your appetite. This medicine is intended to be used in addition to a healthy reduced calorie diet and exercise. The best results are achieved this way. This medicine is only indicated for short-term use. Eventually your weight loss may level out and the medication will no longer be needed.   How should I use this medicine? Take this medicine by mouth. Follow the directions on the prescription label. The tablets should stay in the bottle until immediately before you take your dose. Take your doses at regular intervals. Do not take your medicine more often than directed.  Overdosage: If you think you have taken too much of this medicine contact a poison control center or emergency room at once. NOTE: This medicine is only for you. Do not share this medicine with others.  What if I miss a dose? If you miss a dose, take it as soon as you can. If it is almost time for your next dose, take only that dose. Do not take double or extra  doses. Do not increase or in any way change your dose without consulting your doctor.  What should I watch for while using this medicine? Notify your physician immediately if you become short of breath while doing your normal activities. Do not take this medicine within 6 hours of bedtime. It can keep you from getting to sleep. Avoid drinks that contain caffeine and try to stick to a regular bedtime every night. Do not stand or sit up quickly, especially if you are an older patient. This reduces the risk of dizzy or fainting spells. Avoid alcoholic drinks.  What side effects may I notice from receiving this medicine? Side effects that you should report to your doctor or health care professional as soon as possible: -chest pain, palpitations -depression or severe changes in mood -increased blood pressure -irritability -nervousness or restlessness -severe dizziness -shortness of breath -problems urinating -unusual swelling of the legs -vomiting  Side effects that usually do not require medical attention (report to your doctor or health care professional if they continue or are bothersome): -blurred vision or other eye problems -changes in sexual ability or desire -constipation or diarrhea -difficulty sleeping -dry mouth or unpleasant taste -headache -nausea This list may not describe all possible side effects. Call your doctor for medical advice about side effects. You may report side effects to FDA at 1-800-FDA-1088.

## 2013-07-03 ENCOUNTER — Encounter: Payer: Self-pay | Admitting: Physician Assistant

## 2013-07-03 ENCOUNTER — Ambulatory Visit: Payer: BC Managed Care – PPO | Admitting: Physician Assistant

## 2013-07-03 VITALS — BP 110/62 | HR 72 | Temp 97.7°F | Resp 16 | Ht 63.0 in | Wt 232.0 lb

## 2013-07-03 DIAGNOSIS — E669 Obesity, unspecified: Secondary | ICD-10-CM

## 2013-07-03 MED ORDER — PHENTERMINE HCL 37.5 MG PO TABS: 37.5000 mg | ORAL_TABLET | Freq: Every day | ORAL | Status: DC

## 2013-07-03 NOTE — Patient Instructions (Signed)
Phentermine  While taking the medication we will ask that you come into the office once a month to monitor your weight, blood pressure, and heart rate. In addition we can help answer your questions about diet, exercise, and help you every step of the way with your weight loss journey. Sometime it is helpful if you bring in a food diary or use an app on your phone such as myfitnesspal to record your calorie intake, especially in the beginning.   You can start out on 1/3 to 1/2 a pill in the morning and if you are tolerating it well you can increase to one pill daily.   What is this medicine? PHENTERMINE (FEN ter meen) decreases your appetite. This medicine is intended to be used in addition to a healthy reduced calorie diet and exercise. The best results are achieved this way. This medicine is only indicated for short-term use. Eventually your weight loss may level out and the medication will no longer be needed.   How should I use this medicine? Take this medicine by mouth. Follow the directions on the prescription label. The tablets should stay in the bottle until immediately before you take your dose. Take your doses at regular intervals. Do not take your medicine more often than directed.  Overdosage: If you think you have taken too much of this medicine contact a poison control center or emergency room at once. NOTE: This medicine is only for you. Do not share this medicine with others.  What if I miss a dose? If you miss a dose, take it as soon as you can. If it is almost time for your next dose, take only that dose. Do not take double or extra doses. Do not increase or in any way change your dose without consulting your doctor.  What should I watch for while using this medicine? Notify your physician immediately if you become short of breath while doing your normal activities. Do not take this medicine within 6 hours of bedtime. It can keep you from getting to sleep. Avoid drinks that contain  caffeine and try to stick to a regular bedtime every night. Do not stand or sit up quickly, especially if you are an older patient. This reduces the risk of dizzy or fainting spells. Avoid alcoholic drinks.  What side effects may I notice from receiving this medicine? Side effects that you should report to your doctor or health care professional as soon as possible: -chest pain, palpitations -depression or severe changes in mood -increased blood pressure -irritability -nervousness or restlessness -severe dizziness -shortness of breath -problems urinating -unusual swelling of the legs -vomiting  Side effects that usually do not require medical attention (report to your doctor or health care professional if they continue or are bothersome): -blurred vision or other eye problems -changes in sexual ability or desire -constipation or diarrhea -difficulty sleeping -dry mouth or unpleasant taste -headache -nausea This list may not describe all possible side effects. Call your doctor for medical advice about side effects. You may report side effects to FDA at 1-800-FDA-1088.  We want weight loss that will last so you should lose 1-2 pounds a week.  THAT IS IT! Please pick THREE things a month to change. Once it is a habit check off the item. Then pick another three items off the list to become habits.  If you are already doing a habit on the list GREAT!  Cross that item off! o Don't drink your calories. Ie, alcohol, soda, fruit   juice, and sweet tea.  o Drink more water. Drink a glass when you feel hungry or before each meal.  o Eat breakfast - Complex carb and protein (likeDannon light and fit yogurt, oatmeal, fruit, eggs, turkey bacon). o Measure your cereal.  Eat no more than one cup a day. (ie Kashi) o Eat an apple a day. o Add a vegetable a day. o Try a new vegetable a month. o Use Pam! Stop using oil or butter to cook. o Don't finish your plate or use smaller plates. o Share your  dessert. o Eat sugar free Jello for dessert or frozen grapes. o Don't eat 2-3 hours before bed. o Switch to whole wheat bread, pasta, and brown rice. o Make healthier choices when you eat out. No fries! o Pick baked chicken, NOT fried. o Don't forget to SLOW DOWN when you eat. It is not going anywhere.  o Take the stairs. o Park far away in the parking lot o Lift soup cans (or weights) for 10 minutes while watching TV. o Walk at work for 10 minutes during break. o Walk outside 1 time a week with your friend, kids, dog, or significant other. o Start a walking group at church. o Walk the mall as much as you can tolerate.  o Keep a food diary. o Weigh yourself daily. o Walk for 15 minutes 3 days per week. o Cook at home more often and eat out less.  If life happens and you go back to old habits, it is okay.  Just start over. You can do it!   If you experience chest pain, get short of breath, or tired during the exercise, please stop immediately and inform your doctor.    Bad carbs also include fruit juice, alcohol, and sweet tea. These are empty calories that do not signal to your brain that you are full.   Please remember the good carbs are still carbs which convert into sugar. So please measure them out no more than 1/2-1 cup of rice, oatmeal, pasta, and beans.  Veggies are however free foods! Pile them on.   I like lean protein at every meal such as chicken, turkey, pork chops, cottage cheese, etc. Just do not fry these meats and please center your meal around vegetable, the meats should be a side dish.   No all fruit is created equal. Please see the list below, the fruit at the bottom is higher in sugars than the fruit at the top    

## 2013-07-03 NOTE — Progress Notes (Signed)
53 y.o.female presents for a follow up after being on phentermine for weight loss for 2 months. Patient states they have fewer sweetened foods & beverages however her father passed after falling in assisted living and breaking his hip 1 month ago. She admits that her diet has been worse and she has been stress eating and eating food that people bring over.  While on the phentermine they have lost 10 lbs since last visit. They deny palpitations, anxiety,elevated BP.  She states her sleep has gotten better, she has changed pillows, she is no longer watching TV, sleeping in cooler room, she takes benadryl as needed. Sleeping about 7 hours a day.   Typical breakfast: hard boiled egg, coffee, toast occ, yogurt Typical lunch: grilled chicken and salad, [place] sandwich, Progresso soup Typical dinner: Meat and veggie, occ potatoes  Medications: Current Outpatient Prescriptions on File Prior to Visit  Medication Sig Dispense Refill  . aspirin 81 MG tablet Take 81 mg by mouth daily.      Marland Kitchen azelastine (ASTELIN) 137 MCG/SPRAY nasal spray Place 2 sprays into both nostrils 2 (two) times daily. Use in each nostril as directed      . cholecalciferol (VITAMIN D) 1000 UNITS tablet Take 5,000 Units by mouth daily.      Marland Kitchen estradiol (MINIVELLE) 0.075 MG/24HR Place 1 patch onto the skin 2 (two) times a week.      . Ferrous Sulfate Dried (SLOW IRON PO) Take by mouth.      . hydrochlorothiazide (HYDRODIURIL) 25 MG tablet Take 25 mg by mouth daily.      . Magnesium 250 MG TABS Take by mouth.      . phentermine (ADIPEX-P) 37.5 MG tablet Take 1 tablet (37.5 mg total) by mouth daily before breakfast.  30 tablet  0  . simvastatin (ZOCOR) 40 MG tablet Take one tablet daily      . topiramate (TOPAMAX) 25 MG tablet Take 25 mg by mouth 2 (two) times daily. 1-3 pills at bedtime       No current facility-administered medications on file prior to visit.    ROS: All negative except for above  Physical exam: Wt Readings from  Last 3 Encounters:  07/03/13 232 lb (105.235 kg)  06/01/13 230 lb (104.327 kg)  05/09/13 242 lb (109.77 kg)   Filed Vitals:   07/03/13 0843  BP: 110/62  Pulse: 72  Temp: 97.7 F (36.5 C)  Resp: 16    BP 110/62  Pulse 72  Temp(Src) 97.7 F (36.5 C)  Resp 16  Ht 5\' 3"  (1.6 m)  Wt 232 lb (105.235 kg)  BMI 41.11 kg/m2 General appearance: alert, cooperative and moderately obese Head: Normocephalic, without obvious abnormality, atraumatic Throat: lips, mucosa, and tongue normal; teeth and gums normal Neck: no adenopathy, no carotid bruit, no JVD, supple, symmetrical, trachea midline and thyroid not enlarged, symmetric, no tenderness/mass/nodules Lungs: clear to auscultation bilaterally Heart: regular rate and rhythm, S1, S2 normal, no murmur, click, rub or gallop Abdomen: soft, non-tender; bowel sounds normal; no masses,  no organomegaly  Assessment: Obesity with co morbid conditions.   Plan: General weight loss/lifestyle modification strategies discussed (elicit support from others; identify saboteurs; non-food rewards, etc). Behavioral treatment: stress management. Diet interventions: diet diary for the following month and patient advised not to try to lose weight at this time because HTN, DM, Cholesterol, likely sleep apnea. Informal exercise measures discussed, e.g. taking stairs instead of elevator. Regular aerobic exercise program discussed. Medication: phentermine. Follow up in: 1  month and as needed.

## 2013-08-10 ENCOUNTER — Ambulatory Visit: Payer: Self-pay | Admitting: Physician Assistant

## 2013-08-14 ENCOUNTER — Other Ambulatory Visit: Payer: Self-pay | Admitting: Physician Assistant

## 2013-08-17 ENCOUNTER — Encounter: Payer: Self-pay | Admitting: Physician Assistant

## 2013-08-17 ENCOUNTER — Ambulatory Visit (INDEPENDENT_AMBULATORY_CARE_PROVIDER_SITE_OTHER): Payer: BC Managed Care – PPO | Admitting: Physician Assistant

## 2013-08-17 VITALS — BP 124/96 | HR 84 | Temp 97.7°F | Resp 16 | Wt 227.0 lb

## 2013-08-17 DIAGNOSIS — J029 Acute pharyngitis, unspecified: Secondary | ICD-10-CM

## 2013-08-17 MED ORDER — NEOMYCIN-POLYMYXIN-DEXAMETH 0.1 % OP SUSP: 1.0000 [drp] | Freq: Four times a day (QID) | OPHTHALMIC | Status: DC

## 2013-08-17 MED ORDER — PHENTERMINE HCL 37.5 MG PO TABS: 37.5000 mg | ORAL_TABLET | Freq: Every day | ORAL | Status: DC

## 2013-08-17 MED ORDER — AZITHROMYCIN 250 MG PO TABS: 250.0000 mg | ORAL_TABLET | Freq: Every day | ORAL | Status: DC

## 2013-08-17 MED ORDER — PROMETHAZINE-CODEINE 6.25-10 MG/5ML PO SYRP: 5.0000 mL | ORAL_SOLUTION | Freq: Four times a day (QID) | ORAL | Status: DC | PRN

## 2013-08-17 MED ORDER — PREDNISONE 20 MG PO TABS: ORAL_TABLET | ORAL | Status: DC

## 2013-08-17 NOTE — Progress Notes (Signed)
   Subjective:    Patient ID: Laura Branch, female    DOB: 06/24/1960, 53 y.o.   MRN: [id number]  Sore Throat  This is a new problem. Episode onset: 1 week. The problem has been unchanged. There has been no fever. The pain is moderate. Associated symptoms include congestion, coughing, a hoarse voice, swollen glands and trouble swallowing. Pertinent negatives include no abdominal pain, diarrhea, drooling, ear discharge, ear pain, headaches, plugged ear sensation, neck pain, shortness of breath, stridor or vomiting. She has tried cool liquids, gargles and acetaminophen for the symptoms. The treatment provided mild relief.   Mother passed March 12, she did not take the phentermine for 2 weeks and has only been on it for 2 weeks.    Review of Systems  Constitutional: Positive for chills and fatigue. Negative for fever and diaphoresis.  HENT: Positive for congestion, hoarse voice, postnasal drip, rhinorrhea, sinus pressure and trouble swallowing. Negative for drooling, ear discharge and ear pain.   Eyes: Positive for pain and redness. Negative for photophobia, discharge, itching and visual disturbance.  Respiratory: Positive for cough. Negative for chest tightness, shortness of breath and stridor.   Cardiovascular: Negative.   Gastrointestinal: Negative.  Negative for vomiting, abdominal pain and diarrhea.  Genitourinary: Negative.   Musculoskeletal: Negative.  Negative for neck pain.  Neurological: Negative.  Negative for headaches.       Objective:   Physical Exam  Constitutional: She is oriented to person, place, and time. She appears well-developed and well-nourished.  HENT:  Head: Normocephalic and atraumatic.  Right Ear: External ear normal.  Left Ear: External ear normal.  Nose: Right sinus exhibits maxillary sinus tenderness. Left sinus exhibits maxillary sinus tenderness.  Mouth/Throat: Oropharynx is clear and moist.  Eyes: EOM are normal. Pupils are equal, round, and reactive  to light. Lids are everted and swept, no foreign bodies found. Right eye exhibits discharge. Right eye exhibits no hordeolum. No foreign body present in the right eye. Left eye exhibits no discharge and no hordeolum. No foreign body present in the left eye. Right conjunctiva is injected. Left conjunctiva is injected.  Neck: Normal range of motion. Neck supple. No thyromegaly present.  Cardiovascular: Normal rate, regular rhythm and normal heart sounds.  Exam reveals no gallop and no friction rub.   No murmur heard. Pulmonary/Chest: Effort normal and breath sounds normal. No respiratory distress. She has no wheezes.  Abdominal: Soft. Bowel sounds are normal. She exhibits no distension and no mass. There is no tenderness. There is no rebound and no guarding.  Musculoskeletal: Normal range of motion.  Lymphadenopathy:    She has cervical adenopathy.  Neurological: She is alert and oriented to person, place, and time. She displays normal reflexes. No cranial nerve deficit. Coordination normal.  Skin: Skin is warm and dry.  Psychiatric: She has a normal mood and affect.       Assessment & Plan:  Acute pharyngitis - Plan: azithromycin (ZITHROMAX) 250 MG tablet, predniSONE (DELTASONE) 20 MG tablet, neomycin-polymyxin-dexamethasone (MAXITROL) 0.1 % ophthalmic suspension, promethazine-codeine (PHENERGAN WITH CODEINE) 6.25-10 MG/5ML syrup   Obesity- cont phentermine 37.5- follow up in 4-6 weeks.

## 2013-08-17 NOTE — Patient Instructions (Addendum)

## 2013-08-20 ENCOUNTER — Ambulatory Visit: Payer: Self-pay | Admitting: Physician Assistant

## 2013-08-21 ENCOUNTER — Ambulatory Visit: Payer: Self-pay | Admitting: Physician Assistant

## 2013-09-19 ENCOUNTER — Ambulatory Visit: Payer: Self-pay | Admitting: Physician Assistant

## 2013-10-08 ENCOUNTER — Ambulatory Visit (INDEPENDENT_AMBULATORY_CARE_PROVIDER_SITE_OTHER): Payer: BC Managed Care – PPO | Admitting: Physician Assistant

## 2013-10-08 ENCOUNTER — Encounter: Payer: Self-pay | Admitting: Physician Assistant

## 2013-10-08 VITALS — BP 128/78 | HR 68 | Temp 97.9°F | Resp 16 | Ht 63.0 in | Wt 237.0 lb

## 2013-10-08 DIAGNOSIS — E559 Vitamin D deficiency, unspecified: Secondary | ICD-10-CM

## 2013-10-08 DIAGNOSIS — Z79899 Other long term (current) drug therapy: Secondary | ICD-10-CM

## 2013-10-08 DIAGNOSIS — F329 Major depressive disorder, single episode, unspecified: Secondary | ICD-10-CM

## 2013-10-08 DIAGNOSIS — E669 Obesity, unspecified: Secondary | ICD-10-CM

## 2013-10-08 DIAGNOSIS — F32A Depression, unspecified: Secondary | ICD-10-CM

## 2013-10-08 DIAGNOSIS — E785 Hyperlipidemia, unspecified: Secondary | ICD-10-CM

## 2013-10-08 DIAGNOSIS — R7303 Prediabetes: Secondary | ICD-10-CM

## 2013-10-08 DIAGNOSIS — Z0000 Encounter for general adult medical examination without abnormal findings: Secondary | ICD-10-CM

## 2013-10-08 DIAGNOSIS — I10 Essential (primary) hypertension: Secondary | ICD-10-CM

## 2013-10-08 LAB — CBC WITH DIFFERENTIAL/PLATELET
BASOS ABS: 0 10*3/uL (ref 0.0–0.1)
Basophils Relative: 0 % (ref 0–1)
Eosinophils Absolute: 0.3 10*3/uL (ref 0.0–0.7)
Eosinophils Relative: 5 % (ref 0–5)
HCT: 38.5 % (ref 36.0–46.0)
Hemoglobin: 13.4 g/dL (ref 12.0–15.0)
LYMPHS ABS: 2.8 10*3/uL (ref 0.7–4.0)
Lymphocytes Relative: 48 % — ABNORMAL HIGH (ref 12–46)
MCH: 27.7 pg (ref 26.0–34.0)
MCHC: 34.8 g/dL (ref 30.0–36.0)
MCV: 79.7 fL (ref 78.0–100.0)
Monocytes Absolute: 0.3 10*3/uL (ref 0.1–1.0)
Monocytes Relative: 5 % (ref 3–12)
NEUTROS ABS: 2.4 10*3/uL (ref 1.7–7.7)
NEUTROS PCT: 42 % — AB (ref 43–77)
PLATELETS: 346 10*3/uL (ref 150–400)
RBC: 4.83 MIL/uL (ref 3.87–5.11)
RDW: 15.2 % (ref 11.5–15.5)
WBC: 5.8 10*3/uL (ref 4.0–10.5)

## 2013-10-08 LAB — HEMOGLOBIN A1C
HEMOGLOBIN A1C: 6.2 % — AB (ref <5.7)
Mean Plasma Glucose: 131 mg/dL — ABNORMAL HIGH (ref <117)

## 2013-10-08 MED ORDER — HYDROCHLOROTHIAZIDE 25 MG PO TABS: ORAL_TABLET | ORAL | Status: DC

## 2013-10-08 MED ORDER — SIMVASTATIN 40 MG PO TABS: ORAL_TABLET | ORAL | Status: DC

## 2013-10-08 NOTE — Progress Notes (Signed)
Complete Physical  Assessment and Plan: Hyperlipidemia--continue medications, check lipids, decrease fatty foods, increase activity.   Allergic rhinitis- Allegra OTC, increase H20, allergy hygiene explained.  Depression-discussed, will consider seeing counseling and will increase exercise  Migraines-controlled  Vitamin D deficiency-cont meds  Prediabetes-Discussed general issues about diabetes pathophysiology and management., Educational material distributed., Suggested low cholesterol diet., Encouraged aerobic exercise., Discussed foot care., Reminded to get yearly retinal exam.  Hypertension-- continue medications, DASH diet, exercise and monitor at home. Call if greater than 130/80.   Obesity-- long discussion about weight loss, diet, and exercise  Puritis- switch back to ALL sensitive detergent, zyrtec QHS, check labs, if not better follow up  Discussed med's effects and SE's. Screening labs and tests as requested with regular follow-up as recommended.  HPI 53 y.o. female  presents for a complete physical. Her blood pressure has been controlled at home, today their BP is BP: 128/78 mmHg She does not workout. She denies chest pain, shortness of breath, dizziness.  She is on cholesterol medication and denies myalgias. Her cholesterol is at goal. The cholesterol last visit was:   Lab Results  Component Value Date   CHOL 238* 05/09/2013   HDL 55 05/09/2013   LDLCALC 161* 05/09/2013   TRIG 92 05/09/2013   CHOLHDL 4.3 05/09/2013   She has been working on diet and exercise for prediabetes, and denies paresthesia of the feet, polydipsia and polyuria. Last A1C in the office was:  Lab Results  Component Value Date   HGBA1C 6.2* 05/09/2013   Patient is on Vitamin D supplement.  She has very bad allergies but takes medications that help.  She has had itching and a rash on her back but believes this is due to switch in detergent.  Obesity- she had lost weight but states with depression from losing  both parents she has been binge eating. She has continued on the phentermine but without weight loss I will take her off. We discussed Vyvanse for binge eating but she prefers to wait on more meds.   Current Medications:  Current Outpatient Prescriptions on File Prior to Visit  Medication Sig Dispense Refill  . aspirin 81 MG tablet Take 81 mg by mouth daily.      Marland Kitchen azelastine (ASTELIN) 137 MCG/SPRAY nasal spray Place 2 sprays into both nostrils 2 (two) times daily. Use in each nostril as directed      . azithromycin (ZITHROMAX) 250 MG tablet Take 1 tablet (250 mg total) by mouth daily.  6 each  1  . cetirizine (ZYRTEC) 10 MG tablet Take 10 mg by mouth daily.      . cholecalciferol (VITAMIN D) 1000 UNITS tablet Take 5,000 Units by mouth daily.      Marland Kitchen estradiol (MINIVELLE) 0.075 MG/24HR Place 1 patch onto the skin 2 (two) times a week.      . Ferrous Sulfate Dried (SLOW IRON PO) Take by mouth.      . hydrochlorothiazide (HYDRODIURIL) 25 MG tablet TAKE 1 TABLET BY MOUTH EVERY DAY  90 tablet  0  . Magnesium 250 MG TABS Take by mouth.      . neomycin-polymyxin-dexamethasone (MAXITROL) 0.1 % ophthalmic suspension Place 1 drop into the right eye 4 (four) times daily.  5 mL  0  . phentermine (ADIPEX-P) 37.5 MG tablet Take 1 tablet (37.5 mg total) by mouth daily before breakfast.  30 tablet  0  . predniSONE (DELTASONE) 20 MG tablet Take one pill two times daily for 3 days, take one  pill daily for 4 days.  10 tablet  0  . promethazine-codeine (PHENERGAN WITH CODEINE) 6.25-10 MG/5ML syrup Take 5 mLs by mouth every 6 (six) hours as needed for cough.  240 mL  0  . simvastatin (ZOCOR) 40 MG tablet Take one tablet daily       No current facility-administered medications on file prior to visit.   Health Maintenance:   Immunization History  Administered Date(s) Administered  . Td 05/07/2007   Tetanus: 2009 Pneumovax: N/A Flu vaccine: will get next year Zostavax: N/A Pap: 2014 neg LMP: 2007 MGM:  03/2013 nl DEXA: 2014 normal Colonoscopy: 2010 due 2020 EGD: 01/2012- gastritis Echo: 2011 trace MR  Patient Care Team: Lucky Cowboy, MD as PCP - General (Internal Medicine) Griffith Citron, MD as Consulting Physician (Gastroenterology) Janifer Adie, MD as Consulting Physician (Gynecology)  Allergies:  Allergies  Allergen Reactions  . Singulair [Montelukast Sodium] Swelling  . Lactose Intolerance (Gi)    Medical History:  Past Medical History  Diagnosis Date  . Hyperlipidemia   . Allergy   . Depression   . Migraines   . Vitamin D deficiency   . Prediabetes   . Hypertension     Echo 2011 normal EF, trace MR  . Obesity    Surgical History:  Past Surgical History  Procedure Laterality Date  . Abdominal hysterectomy    . Stapedectomy Right 2000   Family History:  Family History  Problem Relation Age of Onset  . Hypertension Mother   . Asthma Mother   . Arthritis Mother   . Diabetes Father   . Hyperlipidemia Father   . Hypertension Father   . Hypertension Sister   . Diabetes Daughter   . Cancer Maternal Grandmother 72    colon  . Cancer Maternal Grandfather 75    colon   Social History:  History  Substance Use Topics  . Smoking status: Never Smoker   . Smokeless tobacco: Never Used  . Alcohol Use: No    Review of Systems: [X]  = complains of  [ ]  = denies  General: Fatigue [ ]  Fever [ ]  Chills [ ]  Weakness [ ]   Insomnia [ ] Weight change [ ]  Night sweats [ ]   Change in appetite [ ]  Eyes: Redness [ ]  Blurred vision [ ]  Diplopia [ ]  Discharge [ ]   ENT: Congestion [ ]  Sinus Pain [ ]  Post Nasal Drip [ ]  Sore Throat [ ]  Earache [ ]  hearing loss [ ]  Tinnitus [ ]  Snoring [ ]   Cardiac: Chest pain/pressure [ ]  SOB [ ]  Orthopnea [ ]   Palpitations [ ]   Paroxysmal nocturnal dyspnea[ ]  Claudication [ ]  Edema [ ]   Pulmonary: Cough [ ]  Wheezing[ ]   SOB [ ]   Pleurisy [ ]   GI: Nausea [ ]  Vomiting[ ]  Dysphagia[ ]  Heartburn[ ]  Abdominal pain [ ]  Constipation [ ] ;  Diarrhea [ ]  BRBPR [ ]  Melena[ ]  Bloating [ ]  Hemorrhoids [ ]   GU: Hematuria[ ]  Dysuria [ ]  Nocturia[ ]  Urgency [ ]   Hesitancy [ ]  Discharge [ ]  Frequency [ ]   Breast:  Breast lumps [ ]   nipple discharge [ ]    Neuro: Headaches[ ]  Vertigo[ ]  Paresthesias[ ]  Spasm [ ]  Speech changes [ ]  Incoordination [ ]   Ortho: Arthritis [ ]  Joint pain [ ]  Muscle pain [ ]  Joint swelling [ ]  Back Pain [ ]  Skin:  Rash [ ]   Pruritis Arly.Keller ] Change in skin lesion [ ]   Psych: Depression[ ]   Anxiety[ ]  Confusion [ ]  Memory loss [ ]   Heme/Lypmh: Bleeding [ ]  Bruising [ ]  Enlarged lymph nodes [ ]   Endocrine: Visual blurring [ ]  Paresthesia [ ]  Polyuria [ ]  Polydypsea [ ]    Heat/cold intolerance [ ]  Hypoglycemia [ ]   Physical Exam: Estimated body mass index is 41.99 kg/(m^2) as calculated from the following:   Height as of this encounter: 5\' 3"  (1.6 m).   Weight as of this encounter: 237 lb (107.502 kg). BP 128/78  Pulse 68  Temp(Src) 97.9 F (36.6 C)  Resp 16  Ht 5\' 3"  (1.6 m)  Wt 237 lb (107.502 kg)  BMI 41.99 kg/m2 Wt Readings from Last 3 Encounters:  10/08/13 237 lb (107.502 kg)  08/17/13 227 lb (102.967 kg)  07/03/13 232 lb (105.235 kg)   General Appearance: Well nourished, in no apparent distress. Eyes: PERRLA, EOMs, conjunctiva no swelling or erythema, normal fundi and vessels. Sinuses: No Frontal/maxillary tenderness ENT/Mouth: Ext aud canals clear, normal light reflex with TMs without erythema, bulging.  Good dentition. No erythema, swelling, or exudate on post pharynx. Tonsils not swollen or erythematous. Hearing normal.  Neck: Supple, thyroid normal. No bruits Respiratory: Respiratory effort normal, BS equal bilaterally without rales, rhonchi, wheezing or stridor. Cardio: RRR without murmurs, rubs or gallops. Brisk peripheral pulses without edema.  Chest: symmetric, with normal excursions and percussion. Breasts: defer Abdomen: Soft, +BS. Non tender, no guarding, rebound, hernias, masses, or  organomegaly. .  Lymphatics: Non tender without lymphadenopathy.  Genitourinary: defer Musculoskeletal: Full ROM all peripheral extremities,5/5 strength, and normal gait. Skin: Warm, dry without rashes, lesions, ecchymosis.  Neuro: Cranial nerves intact, reflexes equal bilaterally. Normal muscle tone, no cerebellar symptoms. Sensation intact.  Psych: Awake and oriented X 3, normal affect, Insight and Judgment appropriate.   EKG: WNL no changes. AORTA SCAN: WNL    Quentin MullingAmanda Brylen Wagar 10:22 AM

## 2013-10-08 NOTE — Addendum Note (Signed)
Addended by: Quentin Mulling R on: 10/08/2013 12:16 PM   Modules accepted: Orders

## 2013-10-09 LAB — TSH: TSH: 0.49 u[IU]/mL (ref 0.350–4.500)

## 2013-10-09 LAB — BASIC METABOLIC PANEL WITH GFR
BUN: 11 mg/dL (ref 6–23)
CHLORIDE: 104 meq/L (ref 96–112)
CO2: 26 meq/L (ref 19–32)
Calcium: 9.5 mg/dL (ref 8.4–10.5)
Creat: 0.87 mg/dL (ref 0.50–1.10)
GFR, EST NON AFRICAN AMERICAN: 77 mL/min
GFR, Est African American: 89 mL/min
Glucose, Bld: 83 mg/dL (ref 70–99)
Potassium: 4.1 mEq/L (ref 3.5–5.3)
SODIUM: 139 meq/L (ref 135–145)

## 2013-10-09 LAB — LIPID PANEL
CHOL/HDL RATIO: 3.8 ratio
CHOLESTEROL: 222 mg/dL — AB (ref 0–200)
HDL: 58 mg/dL (ref >39)
LDL Cholesterol: 147 mg/dL — ABNORMAL HIGH (ref 0–99)
Triglycerides: 86 mg/dL (ref <150)
VLDL: 17 mg/dL (ref 0–40)

## 2013-10-09 LAB — URINALYSIS, ROUTINE W REFLEX MICROSCOPIC
BILIRUBIN URINE: NEGATIVE
Glucose, UA: NEGATIVE mg/dL
Hgb urine dipstick: NEGATIVE
Ketones, ur: NEGATIVE mg/dL
Leukocytes, UA: NEGATIVE
Nitrite: NEGATIVE
Protein, ur: NEGATIVE mg/dL
SPECIFIC GRAVITY, URINE: 1.017 (ref 1.005–1.030)
Urobilinogen, UA: 0.2 mg/dL (ref 0.0–1.0)
pH: 6 (ref 5.0–8.0)

## 2013-10-09 LAB — HEPATIC FUNCTION PANEL
ALBUMIN: 4.2 g/dL (ref 3.5–5.2)
ALK PHOS: 66 U/L (ref 39–117)
ALT: 15 U/L (ref 0–35)
AST: 15 U/L (ref 0–37)
BILIRUBIN DIRECT: 0.1 mg/dL (ref 0.0–0.3)
Indirect Bilirubin: 0.4 mg/dL (ref 0.2–1.2)
Total Bilirubin: 0.5 mg/dL (ref 0.2–1.2)
Total Protein: 7.1 g/dL (ref 6.0–8.3)

## 2013-10-09 LAB — VITAMIN B12: Vitamin B-12: 947 pg/mL — ABNORMAL HIGH (ref 211–911)

## 2013-10-09 LAB — IRON AND TIBC
%SAT: 22 % (ref 20–55)
Iron: 69 ug/dL (ref 42–145)
TIBC: 312 ug/dL (ref 250–470)
UIBC: 243 ug/dL (ref 125–400)

## 2013-10-09 LAB — MAGNESIUM: MAGNESIUM: 1.7 mg/dL (ref 1.5–2.5)

## 2013-10-09 LAB — MICROALBUMIN / CREATININE URINE RATIO
CREATININE, URINE: 137 mg/dL
MICROALB UR: 0.5 mg/dL (ref 0.00–1.89)
Microalb Creat Ratio: 3.6 mg/g (ref 0.0–30.0)

## 2013-10-09 LAB — VITAMIN D 25 HYDROXY (VIT D DEFICIENCY, FRACTURES): VIT D 25 HYDROXY: 45 ng/mL (ref 30–89)

## 2013-10-09 LAB — INSULIN, FASTING: Insulin fasting, serum: 23 u[IU]/mL (ref 3–28)

## 2014-01-08 ENCOUNTER — Ambulatory Visit: Payer: Self-pay | Admitting: Physician Assistant

## 2014-01-22 ENCOUNTER — Ambulatory Visit (INDEPENDENT_AMBULATORY_CARE_PROVIDER_SITE_OTHER): Payer: BC Managed Care – PPO | Admitting: Physician Assistant

## 2014-01-22 ENCOUNTER — Encounter: Payer: Self-pay | Admitting: Physician Assistant

## 2014-01-22 VITALS — BP 110/68 | HR 72 | Temp 97.7°F | Resp 16 | Ht 63.0 in | Wt 244.0 lb

## 2014-01-22 DIAGNOSIS — E785 Hyperlipidemia, unspecified: Secondary | ICD-10-CM

## 2014-01-22 DIAGNOSIS — E559 Vitamin D deficiency, unspecified: Secondary | ICD-10-CM

## 2014-01-22 DIAGNOSIS — R7303 Prediabetes: Secondary | ICD-10-CM

## 2014-01-22 DIAGNOSIS — I10 Essential (primary) hypertension: Secondary | ICD-10-CM

## 2014-01-22 DIAGNOSIS — F329 Major depressive disorder, single episode, unspecified: Secondary | ICD-10-CM

## 2014-01-22 DIAGNOSIS — F3289 Other specified depressive episodes: Secondary | ICD-10-CM

## 2014-01-22 DIAGNOSIS — Z79899 Other long term (current) drug therapy: Secondary | ICD-10-CM

## 2014-01-22 DIAGNOSIS — R7309 Other abnormal glucose: Secondary | ICD-10-CM

## 2014-01-22 DIAGNOSIS — E669 Obesity, unspecified: Secondary | ICD-10-CM

## 2014-01-22 DIAGNOSIS — F32A Depression, unspecified: Secondary | ICD-10-CM

## 2014-01-22 MED ORDER — ERYTHROMYCIN 2 % EX GEL: Freq: Two times a day (BID) | CUTANEOUS | Status: DC

## 2014-01-22 MED ORDER — PHENTERMINE HCL 37.5 MG PO TABS: 37.5000 mg | ORAL_TABLET | Freq: Every day | ORAL | Status: DC

## 2014-01-22 NOTE — Patient Instructions (Signed)
Phentermine  While taking the medication we will ask that you come into the office once a month to monitor your weight, blood pressure, and heart rate. In addition we can help answer your questions about diet, exercise, and help you every step of the way with your weight loss journey. Sometime it is helpful if you bring in a food diary or use an app on your phone such as myfitnesspal to record your calorie intake, especially in the beginning.   You can start out on 1/3 to 1/2 a pill in the morning and if you are tolerating it well you can increase to one pill daily.   What is this medicine? PHENTERMINE (FEN ter meen) decreases your appetite. This medicine is intended to be used in addition to a healthy reduced calorie diet and exercise. The best results are achieved this way. This medicine is only indicated for short-term use. Eventually your weight loss may level out and the medication will no longer be needed.   How should I use this medicine? Take this medicine by mouth. Follow the directions on the prescription label. The tablets should stay in the bottle until immediately before you take your dose. Take your doses at regular intervals. Do not take your medicine more often than directed.  Overdosage: If you think you have taken too much of this medicine contact a poison control center or emergency room at once. NOTE: This medicine is only for you. Do not share this medicine with others.  What if I miss a dose? If you miss a dose, take it as soon as you can. If it is almost time for your next dose, take only that dose. Do not take double or extra doses. Do not increase or in any way change your dose without consulting your doctor.  What should I watch for while using this medicine? Notify your physician immediately if you become short of breath while doing your normal activities. Do not take this medicine within 6 hours of bedtime. It can keep you from getting to sleep. Avoid drinks that contain  caffeine and try to stick to a regular bedtime every night. Do not stand or sit up quickly, especially if you are an older patient. This reduces the risk of dizzy or fainting spells. Avoid alcoholic drinks.  What side effects may I notice from receiving this medicine? Side effects that you should report to your doctor or health care professional as soon as possible: -chest pain, palpitations -depression or severe changes in mood -increased blood pressure -irritability -nervousness or restlessness -severe dizziness -shortness of breath -problems urinating -unusual swelling of the legs -vomiting  Side effects that usually do not require medical attention (report to your doctor or health care professional if they continue or are bothersome): -blurred vision or other eye problems -changes in sexual ability or desire -constipation or diarrhea -difficulty sleeping -dry mouth or unpleasant taste -headache -nausea This list may not describe all possible side effects. Call your doctor for medical advice about side effects. You may report side effects to FDA at 1-800-FDA-1088.    Bad carbs also include fruit juice, alcohol, and sweet tea. These are empty calories that do not signal to your brain that you are full.   Please remember the good carbs are still carbs which convert into sugar. So please measure them out no more than 1/2-1 cup of rice, oatmeal, pasta, and beans.  Veggies are however free foods! Pile them on.   I like lean protein at   every meal such as chicken, turkey, pork chops, cottage cheese, etc. Just do not fry these meats and please center your meal around vegetable, the meats should be a side dish.   No all fruit is created equal. Please see the list below, the fruit at the bottom is higher in sugars than the fruit at the top     

## 2014-01-22 NOTE — Progress Notes (Signed)
Assessment and Plan:  Hypertension: Continue medication, monitor blood pressure at home. Continue DASH diet. Cholesterol: Continue diet and exercise. Check cholesterol.  Pre-diabetes-Continue diet and exercise. Check A1C Vitamin D Def- check level and continue medications.  Rash? follicultis- erythromycin gel Obesity with co morbidities- long discussion about weight loss, diet, and exercise  Will start the patient on phentermine- hand out given   Continue diet and meds as discussed. Further disposition pending results of labs.  HPI 53 y.o. female  presents for 3 month follow up with hypertension, hyperlipidemia, prediabetes and vitamin D. Her blood pressure has been controlled at home, today their BP is BP: 110/68 mmHg She does workout, she walks at work. She denies chest pain, shortness of breath, dizziness but she has been having some left calf cramping with walking and she cut her HCTZ in half which has helped.  She is on cholesterol medication and denies myalgias. Her cholesterol is not at goal. The cholesterol last visit was:   Lab Results  Component Value Date   CHOL 222* 10/08/2013   HDL 58 10/08/2013   LDLCALC 696* 10/08/2013   TRIG 86 10/08/2013   CHOLHDL 3.8 10/08/2013   She has been working on diet and exercise for prediabetes, and denies paresthesia of the feet, polydipsia and polyuria. Last A1C in the office was:  Lab Results  Component Value Date   HGBA1C 6.2* 10/08/2013   Patient is on Vitamin D supplement.   Lab Results  Component Value Date   VD25OH 45 10/08/2013     Got stung by yellow jacket on her right shoulder 3-4 weeks ago, got better but it has left a rash.  She is seeing Alycia Rossetti at Palliative care and Tommi Emery at psych in regards to her patients.  BMI is Body mass index is 43.23 kg/(m^2)., she is struggling with weight loss due to stress. She has stopped sodas and says she eats a good lunch but does not eat a good dinner and snacks at night a lot.  Wt Readings from  Last 3 Encounters:  01/22/14 244 lb (110.678 kg)  10/08/13 237 lb (107.502 kg)  08/17/13 227 lb (102.967 kg)    She has a lot of stress and her weight has been increasing.   Current Medications:  Current Outpatient Prescriptions on File Prior to Visit  Medication Sig Dispense Refill  . aspirin 81 MG tablet Take 81 mg by mouth daily.      Marland Kitchen azelastine (ASTELIN) 137 MCG/SPRAY nasal spray Place 2 sprays into both nostrils 2 (two) times daily. Use in each nostril as directed      . cetirizine (ZYRTEC) 10 MG tablet Take 10 mg by mouth daily.      . cholecalciferol (VITAMIN D) 1000 UNITS tablet Take 5,000 Units by mouth daily.      Marland Kitchen estradiol (MINIVELLE) 0.075 MG/24HR Place 1 patch onto the skin 2 (two) times a week.      . Ferrous Sulfate Dried (SLOW IRON PO) Take by mouth.      . hydrochlorothiazide (HYDRODIURIL) 25 MG tablet TAKE 1 TABLET BY MOUTH EVERY DAY  90 tablet  1  . Magnesium 250 MG TABS Take by mouth.      . simvastatin (ZOCOR) 40 MG tablet Take one tablet daily  90 tablet  1   No current facility-administered medications on file prior to visit.   Medical History:  Past Medical History  Diagnosis Date  . Hyperlipidemia   . Allergy   . Depression   .  Migraines   . Vitamin D deficiency   . Prediabetes   . Hypertension     Echo 2011 normal EF, trace MR  . Obesity    Allergies:  Allergies  Allergen Reactions  . Singulair [Montelukast Sodium] Swelling  . Lactose Intolerance (Gi)      Review of Systems:  = complains of   = denies  General: Fatigue  Fever  Chills  Weakness   Insomnia  Eyes: Redness  Blurred vision  Diplopia   ENT: Congestion  Sinus Pain  Post Nasal Drip  Sore Throat  Earache   Cardiac: Chest pain/pressure  SOB  Orthopnea   Palpitations   Paroxysmal nocturnal dyspnea[ ]  Claudication  Edema   Pulmonary: Cough  Wheezing[ ]   SOB   Snoring   GI: Nausea  Vomiting[ ]  Dysphagia[ ]   Heartburn[ ]  Abdominal pain  Constipation ; Diarrhea ; BRBPR  Melena[ ]  GU: Hematuria[ ]  Dysuria  Nocturia[ ]  Urgency   Hesitancy  Discharge  Neuro: Headaches[ ]  Vertigo[ ]  Paresthesias[ ]  Spasm  Speech changes  Incoordination   Ortho: Arthritis  Joint pain  Muscle pain  Joint swelling  Back Pain  Skin:  Rash   Pruritis  Change in skin lesion   Psych: Depression[ ]  Anxiety[ ]  Confusion  Memory loss   Heme/Lypmh: Bleeding  Bruising  Enlarged lymph nodes   Endocrine: Visual blurring  Paresthesia  Polyuria  Polydypsea    Heat/cold intolerance  Hypoglycemia   Family history- Review and unchanged Social history- Review and unchanged Physical Exam: BP 110/68  Pulse 72  Temp(Src) 97.7 F (36.5 C)  Resp 16  Ht  (1.6 m)  Wt 244 lb (110.678 kg)  BMI 43.23 kg/m2 Wt Readings from Last 3 Encounters:  01/22/14 244 lb (110.678 kg)  10/08/13 237 lb (107.502 kg)  08/17/13 227 lb (102.967 kg)   General Appearance: Well nourished, in no apparent distress. Eyes: PERRLA, EOMs, conjunctiva no swelling or erythema Sinuses: No Frontal/maxillary tenderness ENT/Mouth: Ext aud canals clear, TMs without erythema, bulging. No erythema, swelling, or exudate on post pharynx.  Tonsils not swollen or erythematous. Hearing normal.  Neck: Supple, thyroid normal.  Respiratory: Respiratory effort normal, BS equal bilaterally without rales, rhonchi, wheezing or stridor.  Cardio: RRR with no MRGs. Brisk peripheral pulses without edema.  Abdomen: Soft, + BS.  Non tender, no guarding, rebound, hernias, masses. Lymphatics: Non tender without lymphadenopathy.  Musculoskeletal: Full ROM, 5/5 strength, normal gait.  Skin: Warm, dry without  lesions, ecchymosis. Bumps on right shoulder along hair follicle, no erythema/pustules/swelling.  Neuro: Cranial nerves intact. Normal muscle tone, no cerebellar symptoms. Sensation intact.   Psych: Awake and oriented X 3, normal affect, Insight and Judgment appropriate.    Quentin Mulling 4:24 PM

## 2014-01-23 LAB — LIPID PANEL
CHOL/HDL RATIO: 3.3 ratio
Cholesterol: 189 mg/dL (ref 0–200)
HDL: 58 mg/dL (ref >39)
LDL CALC: 111 mg/dL — AB (ref 0–99)
TRIGLYCERIDES: 102 mg/dL (ref <150)
VLDL: 20 mg/dL (ref 0–40)

## 2014-01-23 LAB — BASIC METABOLIC PANEL WITH GFR
BUN: 13 mg/dL (ref 6–23)
CO2: 30 mEq/L (ref 19–32)
Calcium: 10.2 mg/dL (ref 8.4–10.5)
Chloride: 97 mEq/L (ref 96–112)
Creat: 1.22 mg/dL — ABNORMAL HIGH (ref 0.50–1.10)
GFR, EST AFRICAN AMERICAN: 59 mL/min — AB
GFR, EST NON AFRICAN AMERICAN: 51 mL/min — AB
Glucose, Bld: 90 mg/dL (ref 70–99)
Potassium: 3.7 mEq/L (ref 3.5–5.3)
SODIUM: 137 meq/L (ref 135–145)

## 2014-01-23 LAB — CBC WITH DIFFERENTIAL/PLATELET
BASOS PCT: 0 % (ref 0–1)
Basophils Absolute: 0 10*3/uL (ref 0.0–0.1)
Eosinophils Absolute: 0.3 10*3/uL (ref 0.0–0.7)
Eosinophils Relative: 4 % (ref 0–5)
HEMATOCRIT: 40 % (ref 36.0–46.0)
HEMOGLOBIN: 14.1 g/dL (ref 12.0–15.0)
LYMPHS ABS: 2.9 10*3/uL (ref 0.7–4.0)
Lymphocytes Relative: 43 % (ref 12–46)
MCH: 27.4 pg (ref 26.0–34.0)
MCHC: 35.3 g/dL (ref 30.0–36.0)
MCV: 77.8 fL — ABNORMAL LOW (ref 78.0–100.0)
MONOS PCT: 6 % (ref 3–12)
Monocytes Absolute: 0.4 10*3/uL (ref 0.1–1.0)
NEUTROS ABS: 3.1 10*3/uL (ref 1.7–7.7)
Neutrophils Relative %: 47 % (ref 43–77)
Platelets: 359 10*3/uL (ref 150–400)
RBC: 5.14 MIL/uL — AB (ref 3.87–5.11)
RDW: 14.4 % (ref 11.5–15.5)
WBC: 6.7 10*3/uL (ref 4.0–10.5)

## 2014-01-23 LAB — HEPATIC FUNCTION PANEL
ALBUMIN: 4.3 g/dL (ref 3.5–5.2)
ALT: 14 U/L (ref 0–35)
AST: 16 U/L (ref 0–37)
Alkaline Phosphatase: 70 U/L (ref 39–117)
BILIRUBIN INDIRECT: 0.3 mg/dL (ref 0.2–1.2)
Bilirubin, Direct: 0.1 mg/dL (ref 0.0–0.3)
TOTAL PROTEIN: 7.9 g/dL (ref 6.0–8.3)
Total Bilirubin: 0.4 mg/dL (ref 0.2–1.2)

## 2014-01-23 LAB — HEMOGLOBIN A1C
Hgb A1c MFr Bld: 6.5 % — ABNORMAL HIGH (ref <5.7)
Mean Plasma Glucose: 140 mg/dL — ABNORMAL HIGH (ref <117)

## 2014-01-23 LAB — TSH: TSH: 0.493 u[IU]/mL (ref 0.350–4.500)

## 2014-01-23 LAB — MAGNESIUM: Magnesium: 1.6 mg/dL (ref 1.5–2.5)

## 2014-01-23 LAB — VITAMIN D 25 HYDROXY (VIT D DEFICIENCY, FRACTURES): Vit D, 25-Hydroxy: 49 ng/mL (ref 30–89)

## 2014-01-23 LAB — INSULIN, FASTING: INSULIN FASTING, SERUM: 33.3 u[IU]/mL — AB (ref 2.0–19.6)

## 2014-02-26 ENCOUNTER — Other Ambulatory Visit: Payer: Self-pay

## 2014-02-26 MED ORDER — PHENTERMINE HCL 37.5 MG PO TABS: 37.5000 mg | ORAL_TABLET | Freq: Every day | ORAL | Status: DC

## 2014-02-26 NOTE — Telephone Encounter (Signed)
Patient called and asked that I call in her 9/22 RX for Phentermine [facility]  [address] [phone], recalled RX today , [facility] a [facility]

## 2014-05-28 ENCOUNTER — Ambulatory Visit (INDEPENDENT_AMBULATORY_CARE_PROVIDER_SITE_OTHER): Payer: BLUE CROSS/BLUE SHIELD | Admitting: Physician Assistant

## 2014-05-28 ENCOUNTER — Encounter: Payer: Self-pay | Admitting: Physician Assistant

## 2014-05-28 VITALS — BP 132/72 | HR 76 | Temp 98.1°F | Resp 16 | Ht 63.0 in | Wt 239.0 lb

## 2014-05-28 DIAGNOSIS — I10 Essential (primary) hypertension: Secondary | ICD-10-CM

## 2014-05-28 DIAGNOSIS — F32A Depression, unspecified: Secondary | ICD-10-CM

## 2014-05-28 DIAGNOSIS — E785 Hyperlipidemia, unspecified: Secondary | ICD-10-CM

## 2014-05-28 DIAGNOSIS — E669 Obesity, unspecified: Secondary | ICD-10-CM

## 2014-05-28 DIAGNOSIS — R7303 Prediabetes: Secondary | ICD-10-CM

## 2014-05-28 DIAGNOSIS — R7309 Other abnormal glucose: Secondary | ICD-10-CM

## 2014-05-28 DIAGNOSIS — F329 Major depressive disorder, single episode, unspecified: Secondary | ICD-10-CM

## 2014-05-28 DIAGNOSIS — E559 Vitamin D deficiency, unspecified: Secondary | ICD-10-CM

## 2014-05-28 DIAGNOSIS — Z79899 Other long term (current) drug therapy: Secondary | ICD-10-CM

## 2014-05-28 LAB — CBC WITH DIFFERENTIAL/PLATELET
Basophils Absolute: 0 10*3/uL (ref 0.0–0.1)
Basophils Relative: 0 % (ref 0–1)
EOS PCT: 4 % (ref 0–5)
Eosinophils Absolute: 0.3 10*3/uL (ref 0.0–0.7)
HCT: 37.6 % (ref 36.0–46.0)
HEMOGLOBIN: 12.9 g/dL (ref 12.0–15.0)
Lymphocytes Relative: 38 % (ref 12–46)
Lymphs Abs: 2.5 10*3/uL (ref 0.7–4.0)
MCH: 27.6 pg (ref 26.0–34.0)
MCHC: 34.3 g/dL (ref 30.0–36.0)
MCV: 80.5 fL (ref 78.0–100.0)
MPV: 9.7 fL (ref 8.6–12.4)
Monocytes Absolute: 0.3 10*3/uL (ref 0.1–1.0)
Monocytes Relative: 5 % (ref 3–12)
NEUTROS ABS: 3.6 10*3/uL (ref 1.7–7.7)
Neutrophils Relative %: 53 % (ref 43–77)
Platelets: 303 10*3/uL (ref 150–400)
RBC: 4.67 MIL/uL (ref 3.87–5.11)
RDW: 14.6 % (ref 11.5–15.5)
WBC: 6.7 10*3/uL (ref 4.0–10.5)

## 2014-05-28 MED ORDER — PHENTERMINE HCL 37.5 MG PO TABS: 37.5000 mg | ORAL_TABLET | Freq: Every day | ORAL | Status: DC

## 2014-05-28 NOTE — Patient Instructions (Signed)
Please start 100 of Invokana for 1 week then go up to 300mg  of Invokana. This can decreases your blood pressure so please contact [facility] if you have any dizziness, sometime we need to decrease or stop fluid pills or decrease BP meds. You are peeing out 300-400 calories a day of sugar which can lead to yeast infections, you can take the diflucan as needed but if the infections continue we will stop the medications. We will have you follow up in 1 month to check your kidney function and weight. Call if you need anything.   Diabetes is a very complicated disease...lets simplify it.  An easy way to look at it to understand the complications is if you think of the extra sugar floating in your blood stream as glass shards floating through your blood stream.    Diabetes affects your small vessels first: 1) The glass shards (sugar) scraps down the tiny blood vessels in your eyes and lead to diabetic retinopathy, the leading cause of blindness in the [place]. Diabetes is the leading cause of newly diagnosed adult (31 to 53 years of age) blindness in the [place].  2) The glass shards scratches down the tiny vessels of your legs leading to nerve damage called neuropathy and can lead to amputations of your feet. More than 60% of all non-traumatic amputations of lower limbs occur in people with diabetes.  3) Over time the small vessels in your brain are shredded and closed off, individually this does not cause any problems but over a long period of time many of the small vessels being blocked can lead to Vascular Dementia.   4) Your kidney's are a filter system and have a "net" that keeps certain things in the body and lets bad things out. Sugar shreds this net and leads to kidney damage and eventually failure. Decreasing the sugar that is destroying the net and certain blood pressure medications can help stop or decrease progression of kidney disease. Diabetes was the primary cause of kidney failure in 44 percent of  all new cases in 2011.  5) Diabetes also destroys the small vessels in your penis that lead to erectile dysfunction. Eventually the vessels are so damaged that you may not be responsive to cialis or viagra.   Diabetes and your large vessels: Your larger vessels consist of your coronary arteries in your heart and the carotid vessels to your brain. Diabetes or even increased sugars put you at 300% increased risk of heart attack and stroke and this is why.. The sugar scrapes down your large blood vessels and your body sees this as an internal injury and tries to repair itself. Just like you get a scab on your skin, your platelets will stick to the blood vessel wall trying to heal it. This is why we have diabetics on low dose aspirin daily, this prevents the platelets from sticking and can prevent plaque formation. In addition, your body takes cholesterol and tries to shove it into the open wound. This is why we want your LDL, or bad cholesterol, below 70.   The combination of platelets and cholesterol over 5-10 years forms plaque that can break off and cause a heart attack or stroke.   PLEASE REMEMBER:  Diabetes is preventable! Up to 85 percent of complications and morbidities among individuals with type 2 diabetes can be prevented, delayed, or effectively treated and minimized with regular visits to a health professional, appropriate monitoring and medication, and a healthy diet and lifestyle.  Before  you even begin to attack a weight-loss plan, it pays to remember this: You are not fat. You have fat. Losing weight isn't about blame or shame; it's simply another achievement to accomplish. Dieting is like any other skill-you have to buckle down and work at it. As long as you act in a smart, reasonable way, you'll ultimately get where you want to be. Here are some weight loss pearls for you.  1. It's Not a Diet. It's a Lifestyle Thinking of a diet as something you're on and suffering through only for the  short term doesn't work. To shed weight and keep it off, you need to make permanent changes to the way you eat. It's OK to indulge occasionally, of course, but if you cut calories temporarily and then revert to your old way of eating, you'll gain back the weight quicker than you can say yo-yo. Use it to lose it. Research shows that one of the best predictors of long-term weight loss is how many pounds you drop in the first month. For that reason, nutritionists often suggest being stricter for the first two weeks of your new eating strategy to build momentum. Cut out added sugar and alcohol and avoid unrefined carbs. After that, figure out how you can reincorporate them in a way that's healthy and maintainable.  2. There's a Right Way to Exercise Working out burns calories and fat and boosts your metabolism by building muscle. But those trying to lose weight are notorious for overestimating the number of calories they burn and underestimating the amount they take in. Unfortunately, your system is biologically programmed to hold on to extra pounds and that means when you start exercising, your body senses the deficit and ramps up its hunger signals. If you're not diligent, you'll eat everything you burn and then some. Use it to lose it. Cardio gets all the exercise glory, but strength and interval training are the real heroes. They help you build lean muscle, which in turn increases your metabolism and calorie-burning ability 3. Don't Overreact to Mild Hunger Some people have a hard time losing weight because of hunger anxiety. To them, being hungry is bad-something to be avoided at all costs-so they carry snacks with them and eat when they don't need to. Others eat because they're stressed out or bored. While you never want to get to the point of being ravenous (that's when bingeing is likely to happen), a hunger pang, a craving, or the fact that it's 3:00 p.m. should not send you racing for the vending machine  or obsessing about the energy bar in your purse. Ideally, you should put off eating until your stomach is growling and it's difficult to concentrate.  Use it to lose it. When you feel the urge to eat, use the HALT method. Ask yourself, Am I really hungry? Or am I angry or anxious, lonely or bored, or tired? If you're still not certain, try the apple test. If you're truly hungry, an apple should seem delicious; if it doesn't, something else is going on. Or you can try drinking water and making yourself busy, if you are still hungry try a healthy snack.  4. Not All Calories Are Created Equal The mechanics of weight loss are pretty simple: Take in fewer calories than you use for energy. But the kind of food you eat makes all the difference. Processed food that's high in saturated fat and refined starch or sugar can cause inflammation that disrupts the hormone signals that tell  your brain you're full. The result: You eat a lot more.  Use it to lose it. Clean up your diet. Swap in whole, unprocessed foods, including vegetables, lean protein, and healthy fats that will fill you up and give you the biggest nutritional bang for your calorie buck. In a few weeks, as your brain starts receiving regular hunger and fullness signals once again, you'll notice that you feel less hungry overall and naturally start cutting back on the amount you eat.  5. Protein, Produce, and Plant-Based Fats Are Your Weight-Loss Trinity Here's why eating the three Ps regularly will help you drop pounds. Protein fills you up. You need it to build lean muscle, which keeps your metabolism humming so that you can torch more fat. People in a weight-loss program who ate double the recommended daily allowance for protein (about 110 grams for a 150-pound woman) lost 70 percent of their weight from fat, while people who ate the RDA lost only about 40 percent, one study found. Produce is packed with filling fiber. "It's very difficult to consume too  many calories if you're eating a lot of vegetables. Example: Three cups of broccoli is a lot of food, yet only 93 calories. (Fruit is another story. It can be easy to overeat and can contain a lot of calories from sugar, so be sure to monitor your intake.) Plant-based fats like olive oil and those in avocados and nuts are healthy and extra satiating.  Use it to lose it. Aim to incorporate each of the three Ps into every meal and snack. People who eat protein throughout the day are able to keep weight off, according to a study in the American Journal of Clinical Nutrition. In addition to meat, poultry and seafood, good sources are beans, lentils, eggs, tofu, and yogurt. As for fat, keep portion sizes in check by measuring out salad dressing, oil, and nut butters (shoot for one to two tablespoons). Finally, eat veggies or a little fruit at every meal. People who did that consumed 308 fewer calories but didn't feel any hungrier than when they didn't eat more produce.  7. How You Eat Is As Important As What You Eat In order for your brain to register that you're full, you need to focus on what you're eating. Sit down whenever you eat, preferably at a table. Turn off the TV or computer, put down your phone, and look at your food. Smell it. Chew slowly, and don't put another bite on your fork until you swallow. When women ate lunch this attentively, they consumed 30 percent less when snacking later than those who listened to an audiobook at lunchtime, according to a study in the [place] Journal of Nutrition. 8. Weighing Yourself Really Works The scale provides the best evidence about whether your efforts are paying off. Seeing the numbers tick up or down or stagnate is motivation to keep going-or to rethink your approach. [facility] found that daily weigh-ins helped people lose more weight, keep it off, and maintain that loss, even after two years. Use it to lose it. Step on the scale at  the same time every day for the best results. If your weight shoots up several pounds from one weigh-in to the next, don't freak out. Eating a lot of salt the night before or having your period is the likely culprit. The number should return to normal in a day or two. It's a steady climb that you need to do something about.  9. Too Much Stress and Too Little Sleep Are Your Enemies When you're tired and frazzled, your body cranks up the production of cortisol, the stress hormone that can cause carb cravings. Not getting enough sleep also boosts your levels of ghrelin, a hormone associated with hunger, while suppressing leptin, a hormone that signals fullness and satiety. People on a diet who slept only five and a half hours a night for two weeks lost 55 percent less fat and were hungrier than those who slept eight and a half hours, according to a study in the [place] [employer] Journal. Use it to lose it. Prioritize sleep, aiming for seven hours or more a night, which research shows helps lower stress. And make sure you're getting quality zzz's. If a snoring spouse or a fidgety cat wakes you up frequently throughout the night, you may end up getting the equivalent of just four hours of sleep, according to a study from [facility]. Keep pets out of the bedroom, and use a white-noise app to drown out snoring. 10. You Will Hit a plateau-And You Can Bust Through It As you slim down, your body releases much less leptin, the fullness hormone.  If you're not strength training, start right now. Building muscle can raise your metabolism to help you overcome a plateau. To keep your body challenged and burning calories, incorporate new moves and more intense intervals into your workouts or add another sweat session to your weekly routine. Alternatively, cut an extra 100 calories or so a day from your diet. Now that you've lost weight, your body simply doesn't need as much fuel.   Ways to cut 100 calories   1. Eat your eggs with hot sauce OR salsa instead of cheese.  Eggs are great for breakfast, but many people consider eggs and cheese to be BFFs. Instead of cheese-1 oz. of cheddar has 114 calories-top your eggs with hot sauce, which contains no calories and helps with satiety and metabolism. Salsa is also a great option!!  2. Top your toast, waffles or pancakes with mashed berries instead of jelly or syrup. Half a cup of berries-fresh, frozen or thawed-has about 40 calories, compared with 2 tbsp. of maple syrup or jelly, which both have about 100 calories. The berries will also give you a good punch of fiber, which helps keep you full and satisfied and won't spike blood sugar quickly like the jelly or syrup. 3. Swap the non-fat latte for black coffee with a splash of half-and-half. Contrary to its name, that non-fat latte has 130 calories and a startling 19g of carbohydrates per 16 oz. serving. Replacing that 'light' drinkable dessert with a black coffee with a splash of half-and-half saves you more than 100 calories per 16 oz. serving. 4. Sprinkle salads with freeze-dried raspberries instead of dried cranberries. If you want a sweet addition to your nutritious salad, stay away from dried cranberries. They have a whopping 130 calories per  cup and 30g carbohydrates. Instead, sprinkle freeze-dried raspberries guilt-free and save more than 100 calories per  cup serving, adding 3g of belly-filling fiber. 5. Go for mustard in place of mayo on your sandwich. Mustard can add really nice flavor to any sandwich, and there are tons of varieties, from spicy to honey. A serving of mayo is 95 calories, versus 10 calories in a serving of mustard. 6. Choose a DIY salad dressing instead of the store-bought kind. Mix Dijon or whole grain mustard with low-fat Kefir or red wine vinegar and  garlic. 7. Use hummus as a spread instead of a dip. Use hummus as a spread on a high-fiber cracker or tortilla with a sandwich and  save on calories without sacrificing taste. 8. Pick just one salad "accessory." Salad isn't automatically a calorie winner. It's easy to over-accessorize with toppings. Instead of topping your salad with nuts, avocado and cranberries (all three will clock in at 313 calories), just pick one. The next day, choose a different accessory, which will also keep your salad interesting. You don't wear all your jewelry every day, right? 9. Ditch the white pasta in favor of spaghetti squash. One cup of cooked spaghetti squash has about 40 calories, compared with traditional spaghetti, which comes with more than 200. Spaghetti squash is also nutrient-dense. It's a good source of fiber and Vitamins A and C, and it can be eaten just like you would eat pasta-with a great tomato sauce and [place] meatballs or with pesto, tofu and spinach, for example. 10. Dress up your chili, soups and stews with non-fat [place] yogurt instead of sour cream. Just a 'dollop' of sour cream can set you back 115 calories and a whopping 12g of fat-seven of which are of the artery-clogging variety. Added bonus: [place] yogurt is packed with muscle-building protein, calcium and B Vitamins. 11. Mash cauliflower instead of mashed potatoes. One cup of traditional mashed potatoes-in all their creamy goodness-has more than 200 calories, compared to mashed cauliflower, which you can typically eat for less than 100 calories per 1 cup serving. Cauliflower is a great source of the antioxidant indole-3-carbinol (I3C), which may help reduce the risk of some cancers, like breast cancer. 12. Ditch the ice cream sundae in favor of a [place] yogurt parfait. Instead of a cup of ice cream or fro-yo for dessert, try 1 cup of nonfat Greek yogurt topped with fresh berries and a sprinkle of cacao nibs. Both toppings are packed with antioxidants, which can help reduce cellular inflammation and oxidative damage. And the comparison is a no-brainer: One cup of ice cream has  about 275 calories; one cup of frozen yogurt has about 230; and a cup of Greek yogurt has just 130, plus twice the protein, so you're less likely to return to the freezer for a second helping. 13. Put olive oil in a spray container instead of using it directly from the bottle. Each tablespoon of olive oil is 120 calories and 15g of fat. Use a mister instead of pouring it straight into the pan or onto a salad. This allows for portion control and will save you more than 100 calories. 14. When baking, substitute canned pumpkin for butter or oil. Canned pumpkin-not pumpkin pie mix-is loaded with Vitamin A, which is important for skin and eye health, as well as immunity. And the comparisons are pretty crazy:  cup of canned pumpkin has about 40 calories, compared to butter or oil, which has more than 800 calories. Yes, 800 calories. Applesauce and mashed banana can also serve as good substitutions for butter or oil, usually in a 1:1 ratio. 15. Top casseroles with high-fiber cereal instead of breadcrumbs. Breadcrumbs are typically made with white bread, while breakfast cereals contain 5-9g of fiber per serving. Not only will you save more than 150 calories per  cup serving, the swap will also keep you more full and you'll get a metabolism boost from the added fiber. 16. Snack on pistachios instead of macadamia nuts. Believe it or not, you get the same amount of calories from 35  pistachios (100 calories) as you would from only five macadamia nuts. 17. Chow down on kale chips rather than potato chips. This is my favorite 'don't knock it 'till you try it' swap. Kale chips are so easy to make at home, and you can spice them up with a little grated parmesan or chili powder. Plus, they're a mere fraction of the calories of potato chips, but with the same crunch factor we crave so often. 18. Add seltzer and some fruit slices to your cocktail instead of soda or fruit juice. One cup of soda or fruit juice can pack on  as much as 140 calories. Instead, use seltzer and fruit slices. The fruit provides valuable phytochemicals, such as flavonoids and anthocyanins, which help to combat cancer and stave off the aging process.  Phentermine  While taking the medication we may ask that you come into the office once a month or once every 2-3 months to monitor your weight, blood pressure, and heart rate. In addition we can help answer your questions about diet, exercise, and help you every step of the way with your weight loss journey. Sometime it is helpful if you bring in a food diary or use an app on your phone such as myfitnesspal to record your calorie intake, especially in the beginning.   You can start out on 1/3 to 1/2 a pill in the morning and if you are tolerating it well you can increase to one pill daily. I also have some patients that take 1/3 or 1/2 at lunch to help prevent night time eating.  This medication is cheapest CASH pay at [facility] and you do NOT need a membership to get meds from there. 16-17 dollars cash pay.

## 2014-05-28 NOTE — Progress Notes (Signed)
Assessment and Plan:  Hypertension: Continue medication, monitor blood pressure at home. Continue DASH diet.  Reminder to go to the ER if any CP, SOB, nausea, dizziness, severe HA, changes vision/speech, left arm numbness and tingling, and jaw pain. Cholesterol: Continue diet and exercise. Check cholesterol.  Pre-diabetes/DM range-Continue diet and exercise. Check A1C if in DM range still with change DX from pre DM to DM and add invokana.  Vitamin D Def- check level and continue medications.  Obesity with co morbidities- long discussion about weight loss, diet, and exercise, continue phentermine Rash- healing, if gets worse call the office.   Continue diet and meds as discussed. Further disposition pending results of labs.  HPI 54 y.o. female  presents for 3 month follow up with hypertension, hyperlipidemia, prediabetes and vitamin D.  Her blood pressure has been controlled at home, today their BP is BP: 132/72 mmHg  She does not workout regularly due to the weather, trying to get 1/2-1 mile occ.  She denies chest pain, shortness of breath, dizziness.   She is on cholesterol medication, simvastatin 40 and denies myalgias. Her cholesterol is at goal. The cholesterol last visit was:   Lab Results  Component Value Date   CHOL 189 01/22/2014   HDL 58 01/22/2014   LDLCALC 111* 01/22/2014   TRIG 102 01/22/2014   CHOLHDL 3.3 01/22/2014  She has been working on diet and exercise for prediabetes, last visit her A1C went from preDM (6.2) to DM range, she is on bASA and denies paresthesia of the feet, polydipsia, polyuria and visual disturbances. Last A1C in the office was:  Lab Results  Component Value Date   HGBA1C 6.5* 01/22/2014  Patient is on Vitamin D supplement.   Lab Results  Component Value Date   VD25OH 3549 01/22/2014  She also complains of rash x 2 weeks, diffuse on arms, AB, some on legs, it is improving, did switch from gain detergent to pods and just switched to sensitive detergent  Thursday which may be making a difference, less itchy at night. She was on benadryl and used a cream that was prescribed a while back.  BMI is Body mass index is 42.35 kg/(m^2)., she is working on diet and exercise, she was started on phentermine and had some nausea with it but has tolerated it other wise.  Wt Readings from Last 3 Encounters:  05/28/14 239 lb (108.41 kg)  01/22/14 244 lb (110.678 kg)  10/08/13 237 lb (107.502 kg)    Current Medications:  Current Outpatient Prescriptions on File Prior to Visit  Medication Sig Dispense Refill  . aspirin 81 MG tablet Take 81 mg by mouth daily.    Marland Kitchen. azelastine (ASTELIN) 137 MCG/SPRAY nasal spray Place 2 sprays into both nostrils 2 (two) times daily. Use in each nostril as directed    . cetirizine (ZYRTEC) 10 MG tablet Take 10 mg by mouth daily.    . cholecalciferol (VITAMIN D) 1000 UNITS tablet Take 5,000 Units by mouth daily.    Marland Kitchen. erythromycin with ethanol (EMGEL) 2 % gel Apply topically 2 (two) times daily. 30 g 2  . estradiol (MINIVELLE) 0.075 MG/24HR Place 1 patch onto the skin 2 (two) times a week.    . Ferrous Sulfate Dried (SLOW IRON PO) Take by mouth.    . hydrochlorothiazide (HYDRODIURIL) 25 MG tablet TAKE 1 TABLET BY MOUTH EVERY DAY 90 tablet 1  . Magnesium 250 MG TABS Take by mouth.    . phentermine (ADIPEX-P) 37.5 MG tablet Take 1  tablet (37.5 mg total) by mouth daily before breakfast. 30 tablet 2  . simvastatin (ZOCOR) 40 MG tablet Take one tablet daily 90 tablet 1   No current facility-administered medications on file prior to visit.   Medical History:  Past Medical History  Diagnosis Date  . Hyperlipidemia   . Allergy   . Depression   . Migraines   . Vitamin D deficiency   . Prediabetes   . Hypertension     Echo 2011 normal EF, trace MR  . Obesity    Allergies:  Allergies  Allergen Reactions  . Singulair [Montelukast Sodium] Swelling  . Lactose Intolerance (Gi)     Review of Systems:  Review of Systems   Constitutional: Negative.   HENT: Positive for congestion. Negative for ear discharge, ear pain, nosebleeds, sore throat and tinnitus.   Eyes: Negative.   Respiratory: Negative.  Negative for stridor.   Cardiovascular: Negative.   Gastrointestinal: Positive for constipation. Negative for heartburn, nausea, vomiting, abdominal pain, diarrhea, blood in stool and melena.  Genitourinary: Negative.   Musculoskeletal: Negative.   Skin: Positive for itching and rash.  Neurological: Negative.  Negative for headaches.  Endo/Heme/Allergies: Negative.   Psychiatric/Behavioral: Negative.     Family history- Review and unchanged Social history- Review and unchanged Physical Exam: BP 132/72 mmHg  Pulse 76  Temp(Src) 98.1 F (36.7 C)  Resp 16  Ht  (1.6 m)  Wt 239 lb (108.41 kg)  BMI 42.35 kg/m2 Wt Readings from Last 3 Encounters:  05/28/14 239 lb (108.41 kg)  01/22/14 244 lb (110.678 kg)  10/08/13 237 lb (107.502 kg)   General Appearance: Well nourished, in no apparent distress. Eyes: PERRLA, EOMs, conjunctiva no swelling or erythema Sinuses: No Frontal/maxillary tenderness ENT/Mouth: Ext aud canals clear, TMs without erythema, bulging. No erythema, swelling, or exudate on post pharynx.  Tonsils not swollen or erythematous. Hearing normal.  Neck: Supple, thyroid normal.  Respiratory: Respiratory effort normal, BS equal bilaterally without rales, rhonchi, wheezing or stridor.  Cardio: RRR with no MRGs. Brisk peripheral pulses without edema.  Abdomen: Soft, + BS.  Non tender, no guarding, rebound, hernias, masses. Lymphatics: Non tender without lymphadenopathy.  Musculoskeletal: Full ROM, 5/5 strength, normal gait.  Skin: Warm, dry without lesions, ecchymosis. Healing palpable, papules on AB and arms.  Neuro: Cranial nerves intact. Normal muscle tone, no cerebellar symptoms. Sensation intact.  Psych: Awake and oriented X 3, normal affect, Insight and Judgment appropriate.     Quentin Mulling, PA-C 4:23 PM [facility]

## 2014-05-29 LAB — BASIC METABOLIC PANEL WITH GFR
BUN: 12 mg/dL (ref 6–23)
CO2: 27 mEq/L (ref 19–32)
CREATININE: 0.81 mg/dL (ref 0.50–1.10)
Calcium: 9.1 mg/dL (ref 8.4–10.5)
Chloride: 108 mEq/L (ref 96–112)
GFR, Est Non African American: 84 mL/min
Glucose, Bld: 106 mg/dL — ABNORMAL HIGH (ref 70–99)
Potassium: 3.7 mEq/L (ref 3.5–5.3)
Sodium: 142 mEq/L (ref 135–145)

## 2014-05-29 LAB — TSH: TSH: 0.203 u[IU]/mL — ABNORMAL LOW (ref 0.350–4.500)

## 2014-05-29 LAB — LIPID PANEL
Cholesterol: 202 mg/dL — ABNORMAL HIGH (ref 0–200)
HDL: 50 mg/dL (ref >39)
LDL Cholesterol: 131 mg/dL — ABNORMAL HIGH (ref 0–99)
Total CHOL/HDL Ratio: 4 Ratio
Triglycerides: 105 mg/dL (ref <150)
VLDL: 21 mg/dL (ref 0–40)

## 2014-05-29 LAB — MAGNESIUM: Magnesium: 2 mg/dL (ref 1.5–2.5)

## 2014-05-29 LAB — HEPATIC FUNCTION PANEL
ALT: 11 U/L (ref 0–35)
AST: 10 U/L (ref 0–37)
Albumin: 3.9 g/dL (ref 3.5–5.2)
Alkaline Phosphatase: 67 U/L (ref 39–117)
Total Bilirubin: 0.3 mg/dL (ref 0.2–1.2)
Total Protein: 6.6 g/dL (ref 6.0–8.3)

## 2014-05-29 LAB — HEMOGLOBIN A1C
Hgb A1c MFr Bld: 6 % — ABNORMAL HIGH (ref <5.7)
Mean Plasma Glucose: 126 mg/dL — ABNORMAL HIGH (ref <117)

## 2014-05-29 LAB — VITAMIN D 25 HYDROXY (VIT D DEFICIENCY, FRACTURES): VIT D 25 HYDROXY: 32 ng/mL (ref 30–100)

## 2014-05-30 ENCOUNTER — Encounter: Payer: Self-pay | Admitting: Physician Assistant

## 2014-06-10 ENCOUNTER — Other Ambulatory Visit: Payer: Self-pay | Admitting: Gynecology

## 2014-06-11 LAB — CYTOLOGY - PAP

## 2014-06-13 ENCOUNTER — Encounter: Payer: Self-pay | Admitting: Physician Assistant

## 2014-06-13 MED ORDER — TRIAMCINOLONE ACETONIDE 0.1 % EX CREA: 1.0000 "application " | TOPICAL_CREAM | Freq: Two times a day (BID) | CUTANEOUS | Status: DC

## 2014-06-15 ENCOUNTER — Emergency Department (HOSPITAL_COMMUNITY)
Admission: EM | Admit: 2014-06-15 | Discharge: 2014-06-15 | Disposition: A | Discharge status: Home / Self Care | Payer: BC Managed Care – PPO | Attending: Emergency Medicine | Admitting: Emergency Medicine

## 2014-06-15 ENCOUNTER — Encounter (HOSPITAL_COMMUNITY): Payer: Self-pay | Admitting: Emergency Medicine

## 2014-06-15 ENCOUNTER — Emergency Department (HOSPITAL_COMMUNITY): Payer: BC Managed Care – PPO

## 2014-06-15 DIAGNOSIS — R109 Unspecified abdominal pain: Secondary | ICD-10-CM

## 2014-06-15 DIAGNOSIS — Z8659 Personal history of other mental and behavioral disorders: Secondary | ICD-10-CM | POA: Insufficient documentation

## 2014-06-15 DIAGNOSIS — E559 Vitamin D deficiency, unspecified: Secondary | ICD-10-CM | POA: Diagnosis not present

## 2014-06-15 DIAGNOSIS — I10 Essential (primary) hypertension: Secondary | ICD-10-CM | POA: Insufficient documentation

## 2014-06-15 DIAGNOSIS — Z7982 Long term (current) use of aspirin: Secondary | ICD-10-CM | POA: Diagnosis not present

## 2014-06-15 DIAGNOSIS — R1032 Left lower quadrant pain: Secondary | ICD-10-CM | POA: Diagnosis not present

## 2014-06-15 DIAGNOSIS — Z7952 Long term (current) use of systemic steroids: Secondary | ICD-10-CM | POA: Insufficient documentation

## 2014-06-15 DIAGNOSIS — Z90710 Acquired absence of both cervix and uterus: Secondary | ICD-10-CM | POA: Diagnosis not present

## 2014-06-15 DIAGNOSIS — Z792 Long term (current) use of antibiotics: Secondary | ICD-10-CM | POA: Diagnosis not present

## 2014-06-15 DIAGNOSIS — K5900 Constipation, unspecified: Secondary | ICD-10-CM | POA: Insufficient documentation

## 2014-06-15 DIAGNOSIS — Z79899 Other long term (current) drug therapy: Secondary | ICD-10-CM | POA: Insufficient documentation

## 2014-06-15 DIAGNOSIS — R1012 Left upper quadrant pain: Secondary | ICD-10-CM | POA: Diagnosis not present

## 2014-06-15 DIAGNOSIS — E785 Hyperlipidemia, unspecified: Secondary | ICD-10-CM | POA: Diagnosis not present

## 2014-06-15 DIAGNOSIS — E669 Obesity, unspecified: Secondary | ICD-10-CM | POA: Diagnosis not present

## 2014-06-15 LAB — CBC WITH DIFFERENTIAL/PLATELET
BASOS ABS: 0 10*3/uL (ref 0.0–0.1)
Basophils Relative: 0 % (ref 0–1)
EOS ABS: 0.4 10*3/uL (ref 0.0–0.7)
EOS PCT: 6 % — AB (ref 0–5)
HCT: 41.3 % (ref 36.0–46.0)
HEMOGLOBIN: 14 g/dL (ref 12.0–15.0)
LYMPHS ABS: 2.8 10*3/uL (ref 0.7–4.0)
Lymphocytes Relative: 43 % (ref 12–46)
MCH: 27.9 pg (ref 26.0–34.0)
MCHC: 33.9 g/dL (ref 30.0–36.0)
MCV: 82.4 fL (ref 78.0–100.0)
MONOS PCT: 6 % (ref 3–12)
Monocytes Absolute: 0.4 10*3/uL (ref 0.1–1.0)
NEUTROS PCT: 45 % (ref 43–77)
Neutro Abs: 3 10*3/uL (ref 1.7–7.7)
PLATELETS: 354 10*3/uL (ref 150–400)
RBC: 5.01 MIL/uL (ref 3.87–5.11)
RDW: 13.6 % (ref 11.5–15.5)
WBC: 6.5 10*3/uL (ref 4.0–10.5)

## 2014-06-15 LAB — URINALYSIS, ROUTINE W REFLEX MICROSCOPIC
Bilirubin Urine: NEGATIVE
Glucose, UA: NEGATIVE mg/dL
KETONES UR: NEGATIVE mg/dL
LEUKOCYTES UA: NEGATIVE
Nitrite: NEGATIVE
Protein, ur: NEGATIVE mg/dL
SPECIFIC GRAVITY, URINE: 1.017 (ref 1.005–1.030)
Urobilinogen, UA: 0.2 mg/dL (ref 0.0–1.0)
pH: 5.5 (ref 5.0–8.0)

## 2014-06-15 LAB — LIPASE, BLOOD: Lipase: 23 U/L (ref 11–59)

## 2014-06-15 LAB — COMPREHENSIVE METABOLIC PANEL
ALT: 16 U/L (ref 0–35)
AST: 19 U/L (ref 0–37)
Albumin: 4.3 g/dL (ref 3.5–5.2)
Alkaline Phosphatase: 79 U/L (ref 39–117)
Anion gap: 8 (ref 5–15)
BUN: 15 mg/dL (ref 6–23)
CO2: 28 mmol/L (ref 19–32)
CREATININE: 1 mg/dL (ref 0.50–1.10)
Calcium: 9.9 mg/dL (ref 8.4–10.5)
Chloride: 101 mmol/L (ref 96–112)
GFR calc Af Amer: 74 mL/min — ABNORMAL LOW (ref >90)
GFR, EST NON AFRICAN AMERICAN: 64 mL/min — AB (ref >90)
Glucose, Bld: 101 mg/dL — ABNORMAL HIGH (ref 70–99)
Potassium: 3.7 mmol/L (ref 3.5–5.1)
Sodium: 137 mmol/L (ref 135–145)
Total Bilirubin: 0.5 mg/dL (ref 0.3–1.2)
Total Protein: 8.5 g/dL — ABNORMAL HIGH (ref 6.0–8.3)

## 2014-06-15 LAB — URINE MICROSCOPIC-ADD ON

## 2014-06-15 MED ORDER — IOHEXOL 300 MG/ML  SOLN
100.0000 mL | Freq: Once | INTRAMUSCULAR | Status: AC | PRN
  Administered 2014-06-15: 100 mL via INTRAVENOUS

## 2014-06-15 MED ORDER — TRAMADOL HCL 50 MG PO TABS: 50.0000 mg | ORAL_TABLET | Freq: Four times a day (QID) | ORAL | Status: DC | PRN

## 2014-06-15 MED ORDER — ONDANSETRON HCL 4 MG PO TABS: 4.0000 mg | ORAL_TABLET | Freq: Four times a day (QID) | ORAL | Status: DC

## 2014-06-15 MED ORDER — IOHEXOL 300 MG/ML  SOLN
50.0000 mL | Freq: Once | INTRAMUSCULAR | Status: AC | PRN
  Administered 2014-06-15: 50 mL via ORAL

## 2014-06-15 MED ORDER — ACETAMINOPHEN 500 MG PO TABS
1000.0000 mg | ORAL_TABLET | Freq: Once | ORAL | Status: AC
  Administered 2014-06-15: 1000 mg via ORAL
  Filled 2014-06-15: qty 2

## 2014-06-15 NOTE — Discharge Instructions (Signed)
Abdominal Pain, Women °Abdominal (stomach, pelvic, or belly) pain can be caused by many things. It is important to tell your doctor: °· The location of the pain. °· Does it come and go or is it present all the time? °· Are there things that start the pain (eating certain foods, exercise)? °· Are there other symptoms associated with the pain (fever, nausea, vomiting, diarrhea)? °All of this is helpful to know when trying to find the cause of the pain. °CAUSES  °· Stomach: virus or bacteria infection, or ulcer. °· Intestine: appendicitis (inflamed appendix), regional ileitis (Crohn's disease), ulcerative colitis (inflamed colon), irritable bowel syndrome, diverticulitis (inflamed diverticulum of the colon), or cancer of the stomach or intestine. °· Gallbladder disease or stones in the gallbladder. °· Kidney disease, kidney stones, or infection. °· Pancreas infection or cancer. °· Fibromyalgia (pain disorder). °· Diseases of the female organs: °¨ Uterus: fibroid (non-cancerous) tumors or infection. °¨ Fallopian tubes: infection or tubal pregnancy. °¨ Ovary: cysts or tumors. °¨ Pelvic adhesions (scar tissue). °¨ Endometriosis (uterus lining tissue growing in the pelvis and on the pelvic organs). °¨ Pelvic congestion syndrome (female organs filling up with blood just before the menstrual period). °¨ Pain with the menstrual period. °¨ Pain with ovulation (producing an egg). °¨ Pain with an IUD (intrauterine device, birth control) in the uterus. °¨ Cancer of the female organs. °· Functional pain (pain not caused by a disease, may improve without treatment). °· Psychological pain. °· Depression. °DIAGNOSIS  °Your doctor will decide the seriousness of your pain by doing an examination. °· Blood tests. °· X-rays. °· Ultrasound. °· CT scan (computed tomography, special type of X-ray). °· MRI (magnetic resonance imaging). °· Cultures, for infection. °· Barium enema (dye inserted in the large intestine, to better view it with  X-rays). °· Colonoscopy (looking in intestine with a lighted tube). °· Laparoscopy (minor surgery, looking in abdomen with a lighted tube). °· Major abdominal exploratory surgery (looking in abdomen with a large incision). °TREATMENT  °The treatment will depend on the cause of the pain.  °· Many cases can be observed and treated at home. °· Over-the-counter medicines recommended by your caregiver. °· Prescription medicine. °· Antibiotics, for infection. °· Birth control pills, for painful periods or for ovulation pain. °· Hormone treatment, for endometriosis. °· Nerve blocking injections. °· Physical therapy. °· Antidepressants. °· Counseling with a psychologist or psychiatrist. °· Minor or major surgery. °HOME CARE INSTRUCTIONS  °· Do not take laxatives, unless directed by your caregiver. °· Take over-the-counter pain medicine only if ordered by your caregiver. Do not take aspirin because it can cause an upset stomach or bleeding. °· Try a clear liquid diet (broth or water) as ordered by your caregiver. Slowly move to a bland diet, as tolerated, if the pain is related to the stomach or intestine. °· Have a thermometer and take your temperature several times a day, and record it. °· Bed rest and sleep, if it helps the pain. °· Avoid sexual intercourse, if it causes pain. °· Avoid stressful situations. °· Keep your follow-up appointments and tests, as your caregiver orders. °· If the pain does not go away with medicine or surgery, you may try: °¨ Acupuncture. °¨ Relaxation exercises (yoga, meditation). °¨ Group therapy. °¨ Counseling. °SEEK MEDICAL CARE IF:  °· You notice certain foods cause stomach pain. °· Your home care treatment is not helping your pain. °· You need stronger pain medicine. °· You want your IUD removed. °· You feel faint or   lightheaded. °· You develop nausea and vomiting. °· You develop a rash. °· You are having side effects or an allergy to your medicine. °SEEK IMMEDIATE MEDICAL CARE IF:  °· Your  pain does not go away or gets worse. °· You have a fever. °· Your pain is felt only in portions of the abdomen. The right side could possibly be appendicitis. The left lower portion of the abdomen could be colitis or diverticulitis. °· You are passing blood in your stools (bright red or black tarry stools, with or without vomiting). °· You have blood in your urine. °· You develop chills, with or without a fever. °· You pass out. °MAKE SURE YOU:  °· Understand these instructions. °· Will watch your condition. °· Will get help right away if you are not doing well or get worse. °Document Released: 02/14/2007 Document Revised: 09/03/2013 Document Reviewed: 03/06/2009 °[employer]® Patient Information ©2015 [employer]. This information is not intended to replace advice given to you by your health care provider. Make sure you discuss any questions you have with your health care provider. ° °

## 2014-06-15 NOTE — ED Notes (Signed)
Pt from home c/o left flank and abdominal pain since yesterday. Denies urinary symptoms. She reports thinking it was constipation and has been taking stool softeners. Last BM yesterday.

## 2014-06-15 NOTE — ED Provider Notes (Signed)
CSN: [id number]     Arrival date & time 06/15/14  1042 History   First MD Initiated Contact with Patient 06/15/14 1053     Chief Complaint  Patient presents with  . Flank Pain  . Abdominal Pain     (Consider location/radiation/quality/duration/timing/severity/associated sxs/prior Treatment) Patient is a 54 y.o. female presenting with abdominal pain.  Abdominal Pain Pain location:  L flank Pain quality: cramping   Pain radiates to:  Does not radiate Pain severity:  Severe Onset quality:  Gradual Duration:  12 hours Timing:  Constant Progression:  Improving Chronicity:  New Context comment:  Pt thought she was constipated, but had a BM yesterday without resolution in symptoms Relieved by: Rest, lying down. Exacerbated by: Sitting up. Associated symptoms: constipation   Associated symptoms: no diarrhea, no dysuria, no fever, no hematuria, no nausea, no shortness of breath, no vaginal bleeding, no vaginal discharge and no vomiting     Past Medical History  Diagnosis Date  . Hyperlipidemia   . Allergy   . Depression   . Migraines   . Vitamin D deficiency   . Prediabetes   . Hypertension     Echo 2011 normal EF, trace MR  . Obesity    Past Surgical History  Procedure Laterality Date  . Abdominal hysterectomy    . Stapedectomy Right 2000   Family History  Problem Relation Age of Onset  . Hypertension Mother   . Asthma Mother   . Arthritis Mother   . Diabetes Father   . Hyperlipidemia Father   . Hypertension Father   . Hypertension Sister   . Diabetes Daughter   . Cancer Maternal Grandmother 72    colon  . Cancer Maternal Grandfather 22    colon   History  Substance Use Topics  . Smoking status: Never Smoker   . Smokeless tobacco: Never Used  . Alcohol Use: No   OB History    No data available     Review of Systems  Constitutional: Negative for fever.  Respiratory: Negative for shortness of breath.   Gastrointestinal: Positive for abdominal pain and  constipation. Negative for nausea, vomiting and diarrhea.  Genitourinary: Positive for flank pain. Negative for dysuria, hematuria, vaginal bleeding and vaginal discharge.  All other systems reviewed and are negative.     Allergies  Singulair and Lactose intolerance (gi)  Home Medications   Prior to Admission medications   Medication Sig Start Date End Date Taking? Authorizing Provider  aspirin 81 MG tablet Take 81 mg by mouth daily.    Historical Provider, MD  azelastine (ASTELIN) 137 MCG/SPRAY nasal spray Place 2 sprays into both nostrils 2 (two) times daily. Use in each nostril as directed    Historical Provider, MD  cetirizine (ZYRTEC) 10 MG tablet Take 10 mg by mouth daily.    Historical Provider, MD  cholecalciferol (VITAMIN D) 1000 UNITS tablet Take 5,000 Units by mouth daily.    Historical Provider, MD  erythromycin with ethanol (EMGEL) 2 % gel Apply topically 2 (two) times daily. 01/22/14   Quentin Mulling, PA-C  estradiol (MINIVELLE) 0.075 MG/24HR Place 1 patch onto the skin 2 (two) times a week.    Historical Provider, MD  Ferrous Sulfate Dried (SLOW IRON PO) Take by mouth.    Historical Provider, MD  hydrochlorothiazide (HYDRODIURIL) 25 MG tablet TAKE 1 TABLET BY MOUTH EVERY DAY 10/08/13   Quentin Mulling, PA-C  Magnesium 250 MG TABS Take by mouth.    Historical Provider, MD  phentermine (  ADIPEX-P) 37.5 MG tablet Take 1 tablet (37.5 mg total) by mouth daily before breakfast. 05/28/14   Quentin Mulling, PA-C  simvastatin (ZOCOR) 40 MG tablet Take one tablet daily 10/08/13   Quentin Mulling, PA-C  triamcinolone cream (KENALOG) 0.1 % Apply 1 application topically 2 (two) times daily. 06/13/14   Quentin Mulling, PA-C   BP 165/99 mmHg  Pulse 106  Temp(Src) 97.6 F (36.4 C) (Oral)  Resp 18  SpO2 100% Physical Exam  Constitutional: She is oriented to person, place, and time. She appears well-developed and well-nourished. No distress.  HENT:  Head: Normocephalic and atraumatic.   Mouth/Throat: Oropharynx is clear and moist.  Eyes: Conjunctivae are normal. Pupils are equal, round, and reactive to light. No scleral icterus.  Neck: Neck supple.  Cardiovascular: Normal rate, regular rhythm, normal heart sounds and intact distal pulses.   No murmur heard. Pulmonary/Chest: Effort normal and breath sounds normal. No stridor. No respiratory distress. She has no rales.  Abdominal: Soft. Bowel sounds are normal. She exhibits no distension. There is tenderness in the left upper quadrant and left lower quadrant. There is no rigidity, no rebound, no guarding and no CVA tenderness.  Musculoskeletal: Normal range of motion.  Neurological: She is alert and oriented to person, place, and time.  Skin: Skin is warm and dry. No rash noted.  Psychiatric: She has a normal mood and affect. Her behavior is normal.  Nursing note and vitals reviewed.   ED Course  Procedures (including critical care time) Labs Review Labs Reviewed  CBC WITH DIFFERENTIAL/PLATELET - Abnormal; Notable for the following:    Eosinophils Relative 6 (*)    All other components within normal limits  COMPREHENSIVE METABOLIC PANEL - Abnormal; Notable for the following:    Glucose, Bld 101 (*)    Total Protein 8.5 (*)    GFR calc non Af Amer 64 (*)    GFR calc Af Amer 74 (*)    All other components within normal limits  URINALYSIS, ROUTINE W REFLEX MICROSCOPIC - Abnormal; Notable for the following:    Hgb urine dipstick TRACE (*)    All other components within normal limits  URINE MICROSCOPIC-ADD ON - Abnormal; Notable for the following:    Squamous Epithelial / LPF FEW (*)    All other components within normal limits  LIPASE, BLOOD    Imaging Review Ct Abdomen Pelvis W Contrast  06/15/2014   CLINICAL DATA:  Left abdominal and flank pain for 1 day  EXAM: CT ABDOMEN AND PELVIS WITH CONTRAST  TECHNIQUE: Multidetector CT imaging of the abdomen and pelvis was performed using the standard protocol following  bolus administration of intravenous contrast. Oral contrast was also administered.  CONTRAST:  OMNIPAQUE IOHEXOL 300 MG/ML  SOLN  COMPARISON:  None.  FINDINGS: Lung bases are clear.  Liver is prominent, measuring 18.3 cm in length. No focal liver lesions are identified. Gallbladder wall is not appreciably thickened. There is no biliary duct dilatation.  Spleen, pancreas, and adrenals appear normal. Kidneys bilaterally show no mass or hydronephrosis on either side. There is no renal or ureteral calculus on either side.  In the pelvis, the urinary bladder is midline with normal wall thickness. There is no pelvic mass or pelvic fluid collection. The rectum is mildly distended with air and stool. Appendix appears normal.  There is some localized mesenteric thickening in the left lower quadrant region adjacent to the proximal sigmoid colon. Note that there is no appreciable diverticular disease seen in this area.  This finding is best appreciated on axial slices 55 through 60, series 2.  There is no bowel obstruction. No free air or portal venous air. There is moderate diffuse stool throughout the colon.  There is no ascites, adenopathy, or abscess in the abdomen or pelvis. There is no abdominal aortic aneurysm. There are no blastic or lytic bone lesions. There is broad-based disc protrusion at L4-5 which, associated with facet hypertrophy bilaterally, is causing mild generalized spinal stenosis.  IMPRESSION: There is some localized mesenteric inflammation in the left lower quadrant anteriorly. There is no associated diverticular disease. Question localized mesenteric panniculitis. This finding may also be indicative of epiploic appendagitis.  No bowel obstruction.  No abscess.  No free air.  Appendix appears normal.  No renal or ureteral calculus.  No hydronephrosis.  Liver prominent without focal lesion.  Spinal stenosis L4-5, multifactorial.   Electronically Signed   By: Bretta BangWilliam  Woodruff III M.D.   On:  06/15/2014 13:58   All radiology studies independently viewed by me.     EKG Interpretation None      MDM   Final diagnoses:  Left sided abdominal pain    54 yo female with left sided abdominal pain starting yesterday.  Symptoms were bad through the night, but have been better today.  CT shows evidence of inflammation in left abdomen, possibly mesenteric panniculitis or epiploic appendagitis.  Pt has remained well appearing, requested tylenol for pain, and has tolerated PO.  She appears stable for outpatient treatment.  I have given her return precautions.      Candyce ChurnJohn David Ashleigh Arya III, MD 06/15/14 [phone]

## 2014-06-20 ENCOUNTER — Ambulatory Visit (INDEPENDENT_AMBULATORY_CARE_PROVIDER_SITE_OTHER): Payer: BLUE CROSS/BLUE SHIELD | Admitting: Physician Assistant

## 2014-06-20 ENCOUNTER — Encounter: Payer: Self-pay | Admitting: Physician Assistant

## 2014-06-20 VITALS — BP 160/94 | HR 98 | Temp 98.0°F | Resp 18 | Ht 63.0 in | Wt 235.0 lb

## 2014-06-20 DIAGNOSIS — R1084 Generalized abdominal pain: Secondary | ICD-10-CM

## 2014-06-20 DIAGNOSIS — K6389 Other specified diseases of intestine: Secondary | ICD-10-CM

## 2014-06-20 DIAGNOSIS — K529 Noninfective gastroenteritis and colitis, unspecified: Secondary | ICD-10-CM

## 2014-06-20 MED ORDER — MELOXICAM 15 MG PO TABS
ORAL_TABLET | ORAL | Status: DC
Start: 2014-06-20 — End: 2014-08-09

## 2014-06-20 NOTE — Progress Notes (Signed)
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   HPI 54 y.o.female with history of HTN, chol, depression, migraines, HTN, obesity presents for follow up from the ER. Patient went to the ER on 06/15/14, for: lower Ab pain. On Ct scan she would found to have mesenteric panniculitis and epiploic appendagitis. She states she is feeling better, less soreness today.  She has been taking tramadol for pain, took aleve on Tuesday, pain is getting better, has had some constipation. Denies nausea, vomiting, diarrhea, fever, chills, dark black stool, blood in stool.   Past Medical History  Diagnosis Date  . Hyperlipidemia   . Allergy   . Depression   . Migraines   . Vitamin D deficiency   . Prediabetes   . Hypertension     Echo 2011 normal EF, trace MR  . Obesity      Allergies  Allergen Reactions  . Singulair [Montelukast Sodium] Swelling  . Lactose Intolerance (Gi)       Current Outpatient Prescriptions on File Prior to Visit  Medication Sig Dispense Refill  . aspirin 81 MG tablet Take 81 mg by mouth daily.    Marland Kitchen azelastine (ASTELIN) 137 MCG/SPRAY nasal spray Place 2 sprays into both nostrils 2 (two) times daily. Use in each nostril as directed    . Calcium-Magnesium-Zinc 167-83-8 MG TABS Take 1 tablet by mouth daily.    . cetirizine (ZYRTEC) 10 MG tablet Take 10 mg by mouth daily.    . cholecalciferol (VITAMIN D) 1000 UNITS tablet Take 5,000 Units by mouth daily.    . hydrochlorothiazide (HYDRODIURIL) 25 MG tablet TAKE 1 TABLET BY MOUTH EVERY DAY 90 tablet 1  . MINIVELLE 0.05 MG/24HR patch Place 1 patch onto the skin 2 (two) times a week. Monday and Fridays    . phentermine (ADIPEX-P) 37.5 MG tablet Take 1 tablet (37.5 mg total) by mouth daily before breakfast. 30 tablet 2  . simvastatin (ZOCOR) 40 MG  tablet Take one tablet daily 90 tablet 1  . traMADol (ULTRAM) 50 MG tablet Take 1 tablet (50 mg total) by mouth every 6 (six) hours as needed. 15 tablet 0  . triamcinolone cream (KENALOG) 0.1 % Apply 1 application topically 2 (two) times daily. 80 g 1   No current facility-administered medications on file prior to visit.    ROS: all negative except above.   Physical Exam: Filed Weights   06/20/14 1432  Weight: 235 lb (106.595 kg)   BP 160/94 mmHg  Pulse 98  Temp(Src) 98 F (36.7 C) (Temporal)  Resp 18  Ht  (1.6 m)  Wt 235 lb (106.595 kg)  BMI 41.64 kg/m2 General Appearance: Well nourished, in no apparent distress. Eyes: PERRLA, EOMs, conjunctiva no swelling or erythema Sinuses: No Frontal/maxillary tenderness ENT/Mouth: Ext aud canals clear, TMs without erythema, bulging. No erythema, swelling, or exudate on post pharynx.  Tonsils not swollen or erythematous. Hearing normal.  Neck: Supple, thyroid normal.  Respiratory: Respiratory effort normal, BS equal bilaterally without rales, rhonchi, wheezing or stridor.  Cardio: RRR with no MRGs. Brisk peripheral pulses without edema.  Abdomen: Soft, + BS, obese,  + diffuse tenderness, worse LLQ, no guarding, rebound, hernias, masses. Lymphatics: Non tender without lymphadenopathy.  Musculoskeletal: Full ROM, 5/5 strength, normal gait.  Skin: Warm, dry without rashes, lesions, ecchymosis.  Neuro: Cranial nerves  intact. Normal muscle tone, no cerebellar symptoms. Sensation intact.  Psych: Awake and oriented X 3, normal affect, Insight and Judgment appropriate.     Quentin Mullingollier, Manya Balash, PA-C 2:56 PM [facility]

## 2014-06-20 NOTE — Patient Instructions (Signed)

## 2014-08-09 ENCOUNTER — Ambulatory Visit (INDEPENDENT_AMBULATORY_CARE_PROVIDER_SITE_OTHER): Payer: BC Managed Care – PPO | Admitting: Internal Medicine

## 2014-08-09 ENCOUNTER — Encounter: Payer: Self-pay | Admitting: Internal Medicine

## 2014-08-09 VITALS — BP 164/92 | HR 100 | Temp 98.4°F | Resp 18 | Ht 63.0 in | Wt 234.0 lb

## 2014-08-09 DIAGNOSIS — J069 Acute upper respiratory infection, unspecified: Secondary | ICD-10-CM

## 2014-08-09 MED ORDER — BENZONATATE 100 MG PO CAPS: 100.0000 mg | ORAL_CAPSULE | Freq: Four times a day (QID) | ORAL | Status: DC | PRN

## 2014-08-09 MED ORDER — PHENYLEPH-PROMETHAZINE-COD 5-6.25-10 MG/5ML PO SYRP: 5.0000 mL | ORAL_SOLUTION | Freq: Every evening | ORAL | Status: DC | PRN

## 2014-08-09 MED ORDER — AZITHROMYCIN 250 MG PO TABS: ORAL_TABLET | ORAL | Status: DC

## 2014-08-09 MED ORDER — PREDNISONE 20 MG PO TABS: ORAL_TABLET | ORAL | Status: DC

## 2014-08-09 NOTE — Progress Notes (Signed)
Patient ID: Laura Branch, female   DOB: 12/10/1960, 54 y.o.   MRN: [id number]  HPI  Patient presents to the office for evaluation of cough.  It has been going on for 4 days.  Patient reports cough that is worse night > day, wet, dry, worse with lying down.  They also endorse change in voice, postnasal drip, sputum production and sore throat and nasal congestion.  They have tried antitussives, antihistamines or astelin spray.  They report that nothing has worked.  They denies other sick contacts.  Review of Systems  Constitutional: Negative for fever, chills and malaise/fatigue.  HENT: Positive for congestion and sore throat. Negative for ear discharge, ear pain, nosebleeds and tinnitus.   Eyes: Positive for discharge (watering).  Respiratory: Negative for cough, sputum production, shortness of breath and wheezing.   Cardiovascular: Negative for chest pain and leg swelling.  Skin: Negative.   Neurological: Negative for headaches.    PE:  General:  Alert and non-toxic, WDWN, NAD HEENT: NCAT, PERLA, EOM normal, no occular discharge or erythema.  Nasal mucosal edema with sinus tenderness to palpation.  Oropharynx clear with minimal oropharyngeal edema and erythema.  Mucous membranes moist and pink. Neck:  Cervical adenopathy Chest:  RRR no MRGs.  Lungs clear to auscultation A&P with no wheezes rhonchi or rales.   Abdomen: +BS x 4 quadrants, soft, non-tender, no guarding, rigidity, or rebound. Skin: warm and dry no rash Neuro: A&Ox4, CN II-XII grossly intact  Assessment and Plan:   1. Acute URI Likely allergic rhinitis vs. Viral infection.  Treat symptomatically and if no improvement then we will try to do zpak.  If worsening SOB wheezing or CP go to ER this weekend.    - predniSONE (DELTASONE) 20 MG tablet; 3 tabs po day one, then 2 tabs daily x 4 days  Dispense: 11 tablet; Refill: 0 - benzonatate (TESSALON PERLES) 100 MG capsule; Take 1 capsule (100 mg total) by mouth every 6 (six)  hours as needed for cough.  Dispense: 30 capsule; Refill: 1 - Phenyleph-Promethazine-Cod 5-6.25-10 MG/5ML SYRP; Take 5 mLs by mouth at bedtime as needed (severe cough).  Dispense: 180 mL; Refill: 0 - azithromycin (ZITHROMAX Z-PAK) 250 MG tablet; 2 po day one, then 1 daily x 4 days  Dispense: 5 tablet; Refill: 0

## 2014-08-09 NOTE — Patient Instructions (Signed)
Upper Respiratory Infection, Adult An upper respiratory infection (URI) is also sometimes known as the common cold. The upper respiratory tract includes the nose, sinuses, throat, trachea, and bronchi. Bronchi are the airways leading to the lungs. Most people improve within 1 week, but symptoms can last up to 2 weeks. A residual cough may last even longer.  CAUSES Many different viruses can infect the tissues lining the upper respiratory tract. The tissues become irritated and inflamed and often become very moist. Mucus production is also common. A cold is contagious. You can easily spread the virus to others by oral contact. This includes kissing, sharing a glass, coughing, or sneezing. Touching your mouth or nose and then touching a surface, which is then touched by another person, can also spread the virus. SYMPTOMS  Symptoms typically develop 1 to 3 days after you come in contact with a cold virus. Symptoms vary from person to person. They may include:  Runny nose.  Sneezing.  Nasal congestion.  Sinus irritation.  Sore throat.  Loss of voice (laryngitis).  Cough.  Fatigue.  Muscle aches.  Loss of appetite.  Headache.  Low-grade fever. DIAGNOSIS  You might diagnose your own cold based on familiar symptoms, since most people get a cold 2 to 3 times a year. Your caregiver can confirm this based on your exam. Most importantly, your caregiver can check that your symptoms are not due to another disease such as strep throat, sinusitis, pneumonia, asthma, or epiglottitis. Blood tests, throat tests, and X-rays are not necessary to diagnose a common cold, but they may sometimes be helpful in excluding other more serious diseases. Your caregiver will decide if any further tests are required. RISKS AND COMPLICATIONS  You may be at risk for a more severe case of the common cold if you smoke cigarettes, have chronic heart disease (such as heart failure) or lung disease (such as asthma), or if  you have a weakened immune system. The very young and very old are also at risk for more serious infections. Bacterial sinusitis, middle ear infections, and bacterial pneumonia can complicate the common cold. The common cold can worsen asthma and chronic obstructive pulmonary disease (COPD). Sometimes, these complications can require emergency medical care and may be life-threatening. PREVENTION  The best way to protect against getting a cold is to practice good hygiene. Avoid oral or hand contact with people with cold symptoms. Wash your hands often if contact occurs. There is no clear evidence that vitamin C, vitamin E, echinacea, or exercise reduces the chance of developing a cold. However, it is always recommended to get plenty of rest and practice good nutrition. TREATMENT  Treatment is directed at relieving symptoms. There is no cure. Antibiotics are not effective, because the infection is caused by a virus, not by bacteria. Treatment may include:  Increased fluid intake. Sports drinks offer valuable electrolytes, sugars, and fluids.  Breathing heated mist or steam (vaporizer or shower).  Eating chicken soup or other clear broths, and maintaining good nutrition.  Getting plenty of rest.  Using gargles or lozenges for comfort.  Controlling fevers with ibuprofen or acetaminophen as directed by your caregiver.  Increasing usage of your inhaler if you have asthma. Zinc gel and zinc lozenges, taken in the first 24 hours of the common cold, can shorten the duration and lessen the severity of symptoms. Pain medicines may help with fever, muscle aches, and throat pain. A variety of non-prescription medicines are available to treat congestion and runny nose. Your caregiver   can make recommendations and may suggest nasal or lung inhalers for other symptoms.  HOME CARE INSTRUCTIONS   Only take over-the-counter or prescription medicines for pain, discomfort, or fever as directed by your  caregiver.  Use a warm mist humidifier or inhale steam from a shower to increase air moisture. This may keep secretions moist and make it easier to breathe.  Drink enough water and fluids to keep your urine clear or pale yellow.  Rest as needed.  Return to work when your temperature has returned to normal or as your caregiver advises. You may need to stay home longer to avoid infecting others. You can also use a face mask and careful hand washing to prevent spread of the virus. SEEK MEDICAL CARE IF:   After the first few days, you feel you are getting worse rather than better.  You need your caregiver's advice about medicines to control symptoms.  You develop chills, worsening shortness of breath, or brown or red sputum. These may be signs of pneumonia.  You develop yellow or brown nasal discharge or pain in the face, especially when you bend forward. These may be signs of sinusitis.  You develop a fever, swollen neck glands, pain with swallowing, or white areas in the back of your throat. These may be signs of strep throat. SEEK IMMEDIATE MEDICAL CARE IF:   You have a fever.  You develop severe or persistent headache, ear pain, sinus pain, or chest pain.  You develop wheezing, a prolonged cough, cough up blood, or have a change in your usual mucus (if you have chronic lung disease).  You develop sore muscles or a stiff neck. Document Released: 10/13/2000 Document Revised: 07/12/2011 Document Reviewed: 07/25/2013 [employer] Patient Information 2015 [employer]. This information is not intended to replace advice given to you by your health care provider. Make sure you discuss any questions you have with your health care provider.  

## 2014-10-09 ENCOUNTER — Encounter: Payer: Self-pay | Admitting: Physician Assistant

## 2014-10-09 ENCOUNTER — Ambulatory Visit (INDEPENDENT_AMBULATORY_CARE_PROVIDER_SITE_OTHER): Payer: BC Managed Care – PPO | Admitting: Physician Assistant

## 2014-10-09 VITALS — BP 132/82 | HR 80 | Temp 97.7°F | Resp 16 | Ht 63.0 in | Wt 237.0 lb

## 2014-10-09 DIAGNOSIS — I8393 Asymptomatic varicose veins of bilateral lower extremities: Secondary | ICD-10-CM | POA: Insufficient documentation

## 2014-10-09 DIAGNOSIS — D649 Anemia, unspecified: Secondary | ICD-10-CM

## 2014-10-09 DIAGNOSIS — Z1331 Encounter for screening for depression: Secondary | ICD-10-CM

## 2014-10-09 DIAGNOSIS — I10 Essential (primary) hypertension: Secondary | ICD-10-CM

## 2014-10-09 DIAGNOSIS — Z79899 Other long term (current) drug therapy: Secondary | ICD-10-CM

## 2014-10-09 DIAGNOSIS — E785 Hyperlipidemia, unspecified: Secondary | ICD-10-CM

## 2014-10-09 DIAGNOSIS — G43809 Other migraine, not intractable, without status migrainosus: Secondary | ICD-10-CM

## 2014-10-09 DIAGNOSIS — Z0000 Encounter for general adult medical examination without abnormal findings: Secondary | ICD-10-CM

## 2014-10-09 DIAGNOSIS — F329 Major depressive disorder, single episode, unspecified: Secondary | ICD-10-CM

## 2014-10-09 DIAGNOSIS — E559 Vitamin D deficiency, unspecified: Secondary | ICD-10-CM

## 2014-10-09 DIAGNOSIS — R7303 Prediabetes: Secondary | ICD-10-CM

## 2014-10-09 DIAGNOSIS — I8390 Asymptomatic varicose veins of unspecified lower extremity: Secondary | ICD-10-CM

## 2014-10-09 DIAGNOSIS — F32A Depression, unspecified: Secondary | ICD-10-CM

## 2014-10-09 DIAGNOSIS — E669 Obesity, unspecified: Secondary | ICD-10-CM

## 2014-10-09 LAB — CBC WITH DIFFERENTIAL/PLATELET
BASOS ABS: 0 10*3/uL (ref 0.0–0.1)
Basophils Relative: 0 % (ref 0–1)
Eosinophils Absolute: 0.4 10*3/uL (ref 0.0–0.7)
Eosinophils Relative: 7 % — ABNORMAL HIGH (ref 0–5)
HCT: 41.5 % (ref 36.0–46.0)
HEMOGLOBIN: 14.1 g/dL (ref 12.0–15.0)
Lymphocytes Relative: 47 % — ABNORMAL HIGH (ref 12–46)
Lymphs Abs: 2.7 10*3/uL (ref 0.7–4.0)
MCH: 28.3 pg (ref 26.0–34.0)
MCHC: 34 g/dL (ref 30.0–36.0)
MCV: 83.3 fL (ref 78.0–100.0)
MPV: 9.6 fL (ref 8.6–12.4)
Monocytes Absolute: 0.3 10*3/uL (ref 0.1–1.0)
Monocytes Relative: 5 % (ref 3–12)
Neutro Abs: 2.4 10*3/uL (ref 1.7–7.7)
Neutrophils Relative %: 41 % — ABNORMAL LOW (ref 43–77)
PLATELETS: 313 10*3/uL (ref 150–400)
RBC: 4.98 MIL/uL (ref 3.87–5.11)
RDW: 14.5 % (ref 11.5–15.5)
WBC: 5.8 10*3/uL (ref 4.0–10.5)

## 2014-10-09 LAB — HEMOGLOBIN A1C
Hgb A1c MFr Bld: 6.2 % — ABNORMAL HIGH (ref <5.7)
Mean Plasma Glucose: 131 mg/dL — ABNORMAL HIGH (ref <117)

## 2014-10-09 NOTE — Patient Instructions (Addendum)
Look up [place] Diabetes is a very complicated disease...lets simplify it.  An easy way to look at it to understand the complications is if you think of the extra sugar floating in your blood stream as glass shards floating through your blood stream.    Diabetes affects your small vessels first: 1) The glass shards (sugar) scraps down the tiny blood vessels in your eyes and lead to diabetic retinopathy, the leading cause of blindness in the [place]. Diabetes is the leading cause of newly diagnosed adult (43 to 53 years of age) blindness in the [place].  2) The glass shards scratches down the tiny vessels of your legs leading to nerve damage called neuropathy and can lead to amputations of your feet. More than 60% of all non-traumatic amputations of lower limbs occur in people with diabetes.  3) Over time the small vessels in your brain are shredded and closed off, individually this does not cause any problems but over a long period of time many of the small vessels being blocked can lead to Vascular Dementia.   4) Your kidney's are a filter system and have a "net" that keeps certain things in the body and lets bad things out. Sugar shreds this net and leads to kidney damage and eventually failure. Decreasing the sugar that is destroying the net and certain blood pressure medications can help stop or decrease progression of kidney disease. Diabetes was the primary cause of kidney failure in 44 percent of all new cases in 2011.  5) Diabetes also destroys the small vessels in your penis that lead to erectile dysfunction. Eventually the vessels are so damaged that you may not be responsive to cialis or viagra.   Diabetes and your large vessels: Your larger vessels consist of your coronary arteries in your heart and the carotid vessels to your brain. Diabetes or even increased sugars put you at 300% increased risk of heart attack and stroke and this is why.. The sugar scrapes down your large blood  vessels and your body sees this as an internal injury and tries to repair itself. Just like you get a scab on your skin, your platelets will stick to the blood vessel wall trying to heal it. This is why we have diabetics on low dose aspirin daily, this prevents the platelets from sticking and can prevent plaque formation. In addition, your body takes cholesterol and tries to shove it into the open wound. This is why we want your LDL, or bad cholesterol, below 70.   The combination of platelets and cholesterol over 5-10 years forms plaque that can break off and cause a heart attack or stroke.   PLEASE REMEMBER:  Diabetes is preventable! Up to 85 percent of complications and morbidities among individuals with type 2 diabetes can be prevented, delayed, or effectively treated and minimized with regular visits to a health professional, appropriate monitoring and medication, and a healthy diet and lifestyle.  Before you even begin to attack a weight-loss plan, it pays to remember this: You are not fat. You have fat. Losing weight isn't about blame or shame; it's simply another achievement to accomplish. Dieting is like any other skill-you have to buckle down and work at it. As long as you act in a smart, reasonable way, you'll ultimately get where you want to be. Here are some weight loss pearls for you.  1. It's Not a Diet. It's a Lifestyle Thinking of a diet as something you're on and suffering through only for the  short term doesn't work. To shed weight and keep it off, you need to make permanent changes to the way you eat. It's OK to indulge occasionally, of course, but if you cut calories temporarily and then revert to your old way of eating, you'll gain back the weight quicker than you can say yo-yo. Use it to lose it. Research shows that one of the best predictors of long-term weight loss is how many pounds you drop in the first month. For that reason, nutritionists often suggest being stricter for the  first two weeks of your new eating strategy to build momentum. Cut out added sugar and alcohol and avoid unrefined carbs. After that, figure out how you can reincorporate them in a way that's healthy and maintainable.  2. There's a Right Way to Exercise Working out burns calories and fat and boosts your metabolism by building muscle. But those trying to lose weight are notorious for overestimating the number of calories they burn and underestimating the amount they take in. Unfortunately, your system is biologically programmed to hold on to extra pounds and that means when you start exercising, your body senses the deficit and ramps up its hunger signals. If you're not diligent, you'll eat everything you burn and then some. Use it to lose it. Cardio gets all the exercise glory, but strength and interval training are the real heroes. They help you build lean muscle, which in turn increases your metabolism and calorie-burning ability 3. Don't Overreact to Mild Hunger Some people have a hard time losing weight because of hunger anxiety. To them, being hungry is bad-something to be avoided at all costs-so they carry snacks with them and eat when they don't need to. Others eat because they're stressed out or bored. While you never want to get to the point of being ravenous (that's when bingeing is likely to happen), a hunger pang, a craving, or the fact that it's 3:00 p.m. should not send you racing for the vending machine or obsessing about the energy bar in your purse. Ideally, you should put off eating until your stomach is growling and it's difficult to concentrate.  Use it to lose it. When you feel the urge to eat, use the HALT method. Ask yourself, Am I really hungry? Or am I angry or anxious, lonely or bored, or tired? If you're still not certain, try the apple test. If you're truly hungry, an apple should seem delicious; if it doesn't, something else is going on. Or you can try drinking water and making  yourself busy, if you are still hungry try a healthy snack.  4. Not All Calories Are Created Equal The mechanics of weight loss are pretty simple: Take in fewer calories than you use for energy. But the kind of food you eat makes all the difference. Processed food that's high in saturated fat and refined starch or sugar can cause inflammation that disrupts the hormone signals that tell your brain you're full. The result: You eat a lot more.  Use it to lose it. Clean up your diet. Swap in whole, unprocessed foods, including vegetables, lean protein, and healthy fats that will fill you up and give you the biggest nutritional bang for your calorie buck. In a few weeks, as your brain starts receiving regular hunger and fullness signals once again, you'll notice that you feel less hungry overall and naturally start cutting back on the amount you eat.  5. Protein, Produce, and Plant-Based Fats Are Your Weight-Loss Trinity Here's why  eating the three Ps regularly will help you drop pounds. Protein fills you up. You need it to build lean muscle, which keeps your metabolism humming so that you can torch more fat. People in a weight-loss program who ate double the recommended daily allowance for protein (about 110 grams for a 150-pound woman) lost 70 percent of their weight from fat, while people who ate the RDA lost only about 40 percent, one study found. Produce is packed with filling fiber. "It's very difficult to consume too many calories if you're eating a lot of vegetables. Example: Three cups of broccoli is a lot of food, yet only 93 calories. (Fruit is another story. It can be easy to overeat and can contain a lot of calories from sugar, so be sure to monitor your intake.) Plant-based fats like olive oil and those in avocados and nuts are healthy and extra satiating.  Use it to lose it. Aim to incorporate each of the three Ps into every meal and snack. People who eat protein throughout the day are able to  keep weight off, according to a study in the American Journal of Clinical Nutrition. In addition to meat, poultry and seafood, good sources are beans, lentils, eggs, tofu, and yogurt. As for fat, keep portion sizes in check by measuring out salad dressing, oil, and nut butters (shoot for one to two tablespoons). Finally, eat veggies or a little fruit at every meal. People who did that consumed 308 fewer calories but didn't feel any hungrier than when they didn't eat more produce.  7. How You Eat Is As Important As What You Eat In order for your brain to register that you're full, you need to focus on what you're eating. Sit down whenever you eat, preferably at a table. Turn off the TV or computer, put down your phone, and look at your food. Smell it. Chew slowly, and don't put another bite on your fork until you swallow. When women ate lunch this attentively, they consumed 30 percent less when snacking later than those who listened to an audiobook at lunchtime, according to a study in the [place] Journal of Nutrition. 8. Weighing Yourself Really Works The scale provides the best evidence about whether your efforts are paying off. Seeing the numbers tick up or down or stagnate is motivation to keep going-or to rethink your approach. A 2015 study [facility] found that daily weigh-ins helped people lose more weight, keep it off, and maintain that loss, even after two years. Use it to lose it. Step on the scale at the same time every day for the best results. If your weight shoots up several pounds from one weigh-in to the next, don't freak out. Eating a lot of salt the night before or having your period is the likely culprit. The number should return to normal in a day or two. It's a steady climb that you need to do something about. 9. Too Much Stress and Too Little Sleep Are Your Enemies When you're tired and frazzled, your body cranks up the production of cortisol, the stress hormone that can cause  carb cravings. Not getting enough sleep also boosts your levels of ghrelin, a hormone associated with hunger, while suppressing leptin, a hormone that signals fullness and satiety. People on a diet who slept only five and a half hours a night for two weeks lost 55 percent less fat and were hungrier than those who slept eight and a half hours, according to a study in  the [place] [employer] Journal. Use it to lose it. Prioritize sleep, aiming for seven hours or more a night, which research shows helps lower stress. And make sure you're getting quality zzz's. If a snoring spouse or a fidgety cat wakes you up frequently throughout the night, you may end up getting the equivalent of just four hours of sleep, according to a study from [facility]. Keep pets out of the bedroom, and use a white-noise app to drown out snoring. 10. You Will Hit a plateau-And You Can Bust Through It As you slim down, your body releases much less leptin, the fullness hormone.  If you're not strength training, start right now. Building muscle can raise your metabolism to help you overcome a plateau. To keep your body challenged and burning calories, incorporate new moves and more intense intervals into your workouts or add another sweat session to your weekly routine. Alternatively, cut an extra 100 calories or so a day from your diet. Now that you've lost weight, your body simply doesn't need as much fuel.   Ways to cut 100 calories  1. Eat your eggs with hot sauce OR salsa instead of cheese.  Eggs are great for breakfast, but many people consider eggs and cheese to be BFFs. Instead of cheese-1 oz. of cheddar has 114 calories-top your eggs with hot sauce, which contains no calories and helps with satiety and metabolism. Salsa is also a great option!!  2. Top your toast, waffles or pancakes with mashed berries instead of jelly or syrup. Half a cup of berries-fresh, frozen or thawed-has about 40 calories, compared with  2 tbsp. of maple syrup or jelly, which both have about 100 calories. The berries will also give you a good punch of fiber, which helps keep you full and satisfied and won't spike blood sugar quickly like the jelly or syrup. 3. Swap the non-fat latte for black coffee with a splash of half-and-half. Contrary to its name, that non-fat latte has 130 calories and a startling 19g of carbohydrates per 16 oz. serving. Replacing that 'light' drinkable dessert with a black coffee with a splash of half-and-half saves you more than 100 calories per 16 oz. serving. 4. Sprinkle salads with freeze-dried raspberries instead of dried cranberries. If you want a sweet addition to your nutritious salad, stay away from dried cranberries. They have a whopping 130 calories per  cup and 30g carbohydrates. Instead, sprinkle freeze-dried raspberries guilt-free and save more than 100 calories per  cup serving, adding 3g of belly-filling fiber. 5. Go for mustard in place of mayo on your sandwich. Mustard can add really nice flavor to any sandwich, and there are tons of varieties, from spicy to honey. A serving of mayo is 95 calories, versus 10 calories in a serving of mustard. 6. Choose a DIY salad dressing instead of the store-bought kind. Mix Dijon or whole grain mustard with low-fat Kefir or red wine vinegar and garlic. 7. Use hummus as a spread instead of a dip. Use hummus as a spread on a high-fiber cracker or tortilla with a sandwich and save on calories without sacrificing taste. 8. Pick just one salad "accessory." Salad isn't automatically a calorie winner. It's easy to over-accessorize with toppings. Instead of topping your salad with nuts, avocado and cranberries (all three will clock in at 313 calories), just pick one. The next day, choose a different accessory, which will also keep your salad interesting. You don't wear all your jewelry every day, right? 9. Ditch  the white pasta in favor of spaghetti squash. One cup  of cooked spaghetti squash has about 40 calories, compared with traditional spaghetti, which comes with more than 200. Spaghetti squash is also nutrient-dense. It's a good source of fiber and Vitamins A and C, and it can be eaten just like you would eat pasta-with a great tomato sauce and [place] meatballs or with pesto, tofu and spinach, for example. 10. Dress up your chili, soups and stews with non-fat [place] yogurt instead of sour cream. Just a 'dollop' of sour cream can set you back 115 calories and a whopping 12g of fat-seven of which are of the artery-clogging variety. Added bonus: [place] yogurt is packed with muscle-building protein, calcium and B Vitamins. 11. Mash cauliflower instead of mashed potatoes. One cup of traditional mashed potatoes-in all their creamy goodness-has more than 200 calories, compared to mashed cauliflower, which you can typically eat for less than 100 calories per 1 cup serving. Cauliflower is a great source of the antioxidant indole-3-carbinol (I3C), which may help reduce the risk of some cancers, like breast cancer. 12. Ditch the ice cream sundae in favor of a [place] yogurt parfait. Instead of a cup of ice cream or fro-yo for dessert, try 1 cup of nonfat Greek yogurt topped with fresh berries and a sprinkle of cacao nibs. Both toppings are packed with antioxidants, which can help reduce cellular inflammation and oxidative damage. And the comparison is a no-brainer: One cup of ice cream has about 275 calories; one cup of frozen yogurt has about 230; and a cup of Greek yogurt has just 130, plus twice the protein, so you're less likely to return to the freezer for a second helping. 13. Put olive oil in a spray container instead of using it directly from the bottle. Each tablespoon of olive oil is 120 calories and 15g of fat. Use a mister instead of pouring it straight into the pan or onto a salad. This allows for portion control and will save you more than 100 calories. 14. When  baking, substitute canned pumpkin for butter or oil. Canned pumpkin-not pumpkin pie mix-is loaded with Vitamin A, which is important for skin and eye health, as well as immunity. And the comparisons are pretty crazy:  cup of canned pumpkin has about 40 calories, compared to butter or oil, which has more than 800 calories. Yes, 800 calories. Applesauce and mashed banana can also serve as good substitutions for butter or oil, usually in a 1:1 ratio. 15. Top casseroles with high-fiber cereal instead of breadcrumbs. Breadcrumbs are typically made with white bread, while breakfast cereals contain 5-9g of fiber per serving. Not only will you save more than 150 calories per  cup serving, the swap will also keep you more full and you'll get a metabolism boost from the added fiber. 16. Snack on pistachios instead of macadamia nuts. Believe it or not, you get the same amount of calories from 35 pistachios (100 calories) as you would from only five macadamia nuts. 17. Chow down on kale chips rather than potato chips. This is my favorite 'don't knock it 'till you try it' swap. Kale chips are so easy to make at home, and you can spice them up with a little grated parmesan or chili powder. Plus, they're a mere fraction of the calories of potato chips, but with the same crunch factor we crave so often. 18. Add seltzer and some fruit slices to your cocktail instead of soda or fruit juice. One cup of  soda or fruit juice can pack on as much as 140 calories. Instead, use seltzer and fruit slices. The fruit provides valuable phytochemicals, such as flavonoids and anthocyanins, which help to combat cancer and stave off the aging process.   Varicose Veins Varicose veins are veins that have become enlarged and twisted. CAUSES This condition is the result of valves in the veins not working properly. Valves in the veins help return blood from the leg to the heart. When your calf muscles squeeze, the blood moves up your leg  then the valves close and this continues until the blood gets back to your heart.  If these valves are damaged, blood flows backwards and backs up into the veins in the leg near the skin OR if your are sitting/standing for a long time without using your calf muscles the blood will back up into the veins in your legs. This causes the veins to become larger. People who are on their feet a lot, sit a lot without walking (like on a plane, at a desk, or in a car), who are pregnant, or who are overweight are more likely to develop varicose veins. SYMPTOMS   Bulging, twisted-appearing, bluish veins, most commonly found on the legs.  Leg pain or a feeling of heaviness. These symptoms may be worse at the end of the day.  Leg swelling.  Skin color changes. DIAGNOSIS  Varicose veins can usually be diagnosed with an exam of your legs by your caregiver. He or she may recommend an ultrasound of your leg veins. TREATMENT  Most varicose veins can be treated at home.However, other treatments are available for people who have persistent symptoms or who want to treat the cosmetic appearance of the varicose veins. But this is only cosmetic and they will return if not properly treated. These include:  Laser treatment of very small varicose veins.  Medicine that is shot (injected) into the vein. This medicine hardens the walls of the vein and closes off the vein. This treatment is called sclerotherapy. Afterwards, you may need to wear clothing or bandages that apply pressure.  Surgery. HOME CARE INSTRUCTIONS   Do not stand or sit in one position for long periods of time. Do not sit with your legs crossed. Rest with your legs raised during the day.  Your legs have to be higher than your heart so that gravity will force the valves to open, so please really elevate your legs.   Wear elastic stockings or support hose. Do not wear other tight, encircling garments around the legs, pelvis, or waist.  ELAST[employer]ERAP[employer]   has a wide variety of well priced compression stockings. [address] [place], [place] [address] [phone]  OR THERE ARE COPPER INFUSED COMPRESSION SOCKS AT [facility] OR [facility]  Walk as much as possible to increase blood flow.  Raise the foot of your bed at night with 2-inch blocks.  If you get a cut in the skin over the vein and the vein bleeds, lie down with your leg raised and press on it with a clean cloth until the bleeding stops. Then place a bandage (dressing) on the cut. See your caregiver if it continues to bleed or needs stitches. SEEK MEDICAL CARE IF:   The skin around your ankle starts to break down.  You have pain, redness, tenderness, or hard swelling developing in your leg over a vein.  You are uncomfortable due to leg pain. Document Released: 01/27/2005 Document Revised: 07/12/2011 Document Reviewed: 06/15/2010 ExitCare  Patient Information 2014 [place].

## 2014-10-09 NOTE — Progress Notes (Signed)
Complete Physical  Assessment and Plan: 1. Essential hypertension - continue medications, DASH diet, exercise and monitor at home. Call if greater than 130/80.  - CBC with Differential/Platelet - BASIC METABOLIC PANEL WITH GFR - Hepatic function panel - TSH - Urinalysis, Routine w reflex microscopic ([facility]) - Microalbumin / creatinine urine ratio - EKG 12-Lead  2. Prediabetes Discussed general issues about diabetes pathophysiology and management., Educational material distributed., Suggested low cholesterol diet., Encouraged aerobic exercise., Discussed foot care., Reminded to get yearly retinal exam. - Hemoglobin A1c - Insulin, fasting  3. Hyperlipidemia -continue medications, check lipids, decrease fatty foods, increase activity.  - Lipid panel  4. Obesity Obesity with co morbidities- long discussion about weight loss, diet, and exercise Stop phentermine for now, discussed vyvanse for binge eating.   5. Vitamin D deficiency - Vit D  25 hydroxy (rtn osteoporosis monitoring)  6. Medication management - Magnesium  7. Depression Negative/remission  8. Other migraine without status migrainosus, not intractable Avoid triggers  9. Routine general medical examination at a health care facility  10. Depression screen negative  11. Varicose veins Varicose veins- weight loss discussed, continue compression stockings and elevation  12. Anemia, unspecified anemia type - Ferritin - Iron and TIBC - Vitamin B12  Discussed med's effects and SE's. Screening labs and tests as requested with regular follow-up as recommended.  HPI 54 y.o. female  presents for a complete physical. Her blood pressure has been controlled at home, today their BP is BP: 132/82 mmHg She does not workout. She denies chest pain, shortness of breath, dizziness.  She is on cholesterol medication, simvastatin 40mg  and denies myalgias. Her cholesterol is at goal. The cholesterol last visit was:   Lab  Results  Component Value Date   CHOL 202* 05/28/2014   HDL 50 05/28/2014   LDLCALC 131* 05/28/2014   TRIG 105 05/28/2014   CHOLHDL 4.0 05/28/2014   She has been working on diet and exercise for prediabetes, and denies paresthesia of the feet, polydipsia and polyuria. She is on bASA. Last A1C in the office was:  Lab Results  Component Value Date   HGBA1C 6.0* 05/28/2014   Patient is on Vitamin D supplement, 2500.  Lab Results  Component Value Date   VD25OH 32 05/28/2014   She has very bad allergies but takes medications that help.  She is on minivelle patch for hot flashes/estrogen def.  BMI is Body mass index is 41.99 kg/(m^2)., she is working on diet and exercise. She has had increased stress with her family and her sister was just sent to [place] today. She has been phentermine but it is not working anymore. Wt Readings from Last 3 Encounters:  10/09/14 237 lb (107.502 kg)  08/09/14 234 lb (106.142 kg)  06/20/14 235 lb (106.595 kg)    Current Medications:  Current Outpatient Prescriptions on File Prior to Visit  Medication Sig Dispense Refill  . aspirin 81 MG tablet Take 81 mg by mouth daily.    Marland Kitchen. azelastine (ASTELIN) 137 MCG/SPRAY nasal spray Place 2 sprays into both nostrils 2 (two) times daily. Use in each nostril as directed    . Calcium-Magnesium-Zinc 167-83-8 MG TABS Take 1 tablet by mouth daily.    . cetirizine (ZYRTEC) 10 MG tablet Take 10 mg by mouth daily.    . cholecalciferol (VITAMIN D) 1000 UNITS tablet Take 5,000 Units by mouth daily.    . hydrochlorothiazide (HYDRODIURIL) 25 MG tablet TAKE 1 TABLET BY MOUTH EVERY DAY 90 tablet  1  . MINIVELLE 0.05 MG/24HR patch Place 1 patch onto the skin 2 (two) times a week. Monday and Fridays    . phentermine (ADIPEX-P) 37.5 MG tablet Take 1 tablet (37.5 mg total) by mouth daily before breakfast. 30 tablet 2  . simvastatin (ZOCOR) 40 MG tablet Take one tablet daily 90 tablet 1  . traMADol (ULTRAM) 50 MG tablet Take 1  tablet (50 mg total) by mouth every 6 (six) hours as needed. 15 tablet 0  . triamcinolone cream (KENALOG) 0.1 % Apply 1 application topically 2 (two) times daily. 80 g 1   No current facility-administered medications on file prior to visit.   Health Maintenance:   Immunization History  Administered Date(s) Administered  . Td 05/07/2007   Tetanus: 2009 Pneumovax: N/A Flu vaccine: N/A Zostavax: N/A Pap: 06/2014 LMP: 2007 MGM: 06/2014 nl DEXA: 2014 normal Colonoscopy: 2010 due 2020 EGD: 01/2012- gastritis Echo: 2011 trace MR CT AB/Pelvis 06/2014  Patient Care Team: Lucky Cowboy, MD as PCP - General (Internal Medicine) Sharrell Ku, MD as Consulting Physician (Gastroenterology) Teodora Medici, MD as Consulting Physician (Gynecology)  Allergies:  Allergies  Allergen Reactions  . Singulair [Montelukast Sodium] Swelling  . Lactose Intolerance (Gi)    Medical History:  Past Medical History  Diagnosis Date  . Hyperlipidemia   . Allergy   . Depression   . Migraines   . Vitamin D deficiency   . Prediabetes   . Hypertension     Echo 2011 normal EF, trace MR  . Obesity    Surgical History:  Past Surgical History  Procedure Laterality Date  . Abdominal hysterectomy    . Stapedectomy Right 2000   Family History:  Family History  Problem Relation Age of Onset  . Hypertension Mother   . Asthma Mother   . Arthritis Mother   . Diabetes Father   . Hyperlipidemia Father   . Hypertension Father   . Hypertension Sister   . Diabetes Daughter   . Cancer Maternal Grandmother 72    colon  . Cancer Maternal Grandfather 73    colon   Social History:  History  Substance Use Topics  . Smoking status: Never Smoker   . Smokeless tobacco: Never Used  . Alcohol Use: No   Review of Systems  Constitutional: Negative.   HENT: Negative.   Eyes: Negative.   Respiratory: Negative.   Cardiovascular: Negative.   Gastrointestinal: Negative.   Genitourinary: Negative.    Musculoskeletal: Negative.   Skin: Negative.   Neurological: Negative.   Endo/Heme/Allergies: Negative.   Psychiatric/Behavioral: Negative.     Physical Exam: Estimated body mass index is 41.99 kg/(m^2) as calculated from the following:   Height as of this encounter:  (1.6 m).   Weight as of this encounter: 237 lb (107.502 kg). BP 132/82 mmHg  Pulse 80  Temp(Src) 97.7 F (36.5 C)  Resp 16  Ht  (1.6 m)  Wt 237 lb (107.502 kg)  BMI 41.99 kg/m2 Wt Readings from Last 3 Encounters:  10/09/14 237 lb (107.502 kg)  08/09/14 234 lb (106.142 kg)  06/20/14 235 lb (106.595 kg)   General Appearance: Well nourished, in no apparent distress. Eyes: PERRLA, EOMs, conjunctiva no swelling or erythema, normal fundi and vessels. Sinuses: No Frontal/maxillary tenderness ENT/Mouth: Ext aud canals clear, normal light reflex with TMs without erythema, bulging.  Good dentition. No erythema, swelling, or exudate on post pharynx. Tonsils not swollen or erythematous. Hearing normal.  Neck: Supple, thyroid normal. No bruits Respiratory:  Respiratory effort normal, BS equal bilaterally without rales, rhonchi, wheezing or stridor. Cardio: RRR without murmurs, rubs or gallops. Brisk peripheral pulses without edema.  Chest: symmetric, with normal excursions and percussion. Breasts: defer Abdomen: Soft, +BS. Non tender, no guarding, rebound, hernias, masses, or organomegaly. .  Lymphatics: Non tender without lymphadenopathy.  Genitourinary: defer Musculoskeletal: Full ROM all peripheral extremities,5/5 strength, and normal gait. Skin: Warm, dry without rashes, lesions, ecchymosis.  Neuro: Cranial nerves intact, reflexes equal bilaterally. Normal muscle tone, no cerebellar symptoms. Sensation intact.  Psych: Awake and oriented X 3, normal affect, Insight and Judgment appropriate.   EKG: WNL no changes. AORTA SCAN: defer   Quentin Mulling 10:20 AM

## 2014-10-10 LAB — URINALYSIS, ROUTINE W REFLEX MICROSCOPIC
BILIRUBIN URINE: NEGATIVE
GLUCOSE, UA: NEGATIVE mg/dL
HGB URINE DIPSTICK: NEGATIVE
Ketones, ur: NEGATIVE mg/dL
Leukocytes, UA: NEGATIVE
Nitrite: NEGATIVE
PH: 5 (ref 5.0–8.0)
Protein, ur: NEGATIVE mg/dL
Specific Gravity, Urine: <1.005 — ABNORMAL LOW (ref 1.005–1.030)
UROBILINOGEN UA: 0.2 mg/dL (ref 0.0–1.0)

## 2014-10-10 LAB — INSULIN, FASTING: INSULIN FASTING, SERUM: 30.9 u[IU]/mL — AB (ref 2.0–19.6)

## 2014-10-10 LAB — MAGNESIUM: Magnesium: 1.9 mg/dL (ref 1.5–2.5)

## 2014-10-10 LAB — LIPID PANEL
Cholesterol: 208 mg/dL — ABNORMAL HIGH (ref 0–200)
HDL: 57 mg/dL (ref >=46)
LDL Cholesterol: 139 mg/dL — ABNORMAL HIGH (ref 0–99)
Total CHOL/HDL Ratio: 3.6 Ratio
Triglycerides: 60 mg/dL (ref <150)
VLDL: 12 mg/dL (ref 0–40)

## 2014-10-10 LAB — FERRITIN: Ferritin: 90 ng/mL (ref 10–291)

## 2014-10-10 LAB — BASIC METABOLIC PANEL WITH GFR
BUN: 11 mg/dL (ref 6–23)
CO2: 26 meq/L (ref 19–32)
Calcium: 9.3 mg/dL (ref 8.4–10.5)
Chloride: 100 mEq/L (ref 96–112)
Creat: 0.94 mg/dL (ref 0.50–1.10)
GFR, Est African American: 81 mL/min
GFR, Est Non African American: 70 mL/min
Glucose, Bld: 103 mg/dL — ABNORMAL HIGH (ref 70–99)
Potassium: 4.1 mEq/L (ref 3.5–5.3)
Sodium: 135 mEq/L (ref 135–145)

## 2014-10-10 LAB — HEPATIC FUNCTION PANEL
ALK PHOS: 73 U/L (ref 39–117)
ALT: 11 U/L (ref 0–35)
AST: 13 U/L (ref 0–37)
Albumin: 4.2 g/dL (ref 3.5–5.2)
Bilirubin, Direct: <0.1 mg/dL (ref 0.0–0.3)
TOTAL PROTEIN: 7.6 g/dL (ref 6.0–8.3)
Total Bilirubin: 0.2 mg/dL (ref 0.2–1.2)

## 2014-10-10 LAB — IRON AND TIBC
%SAT: 12 % — ABNORMAL LOW (ref 20–55)
Iron: 42 ug/dL (ref 42–145)
TIBC: 338 ug/dL (ref 250–470)
UIBC: 296 ug/dL (ref 125–400)

## 2014-10-10 LAB — MICROALBUMIN / CREATININE URINE RATIO
Creatinine, Urine: 116.1 mg/dL
MICROALB/CREAT RATIO: 2.6 mg/g (ref 0.0–30.0)
Microalb, Ur: 0.3 mg/dL (ref <2.0)

## 2014-10-10 LAB — VITAMIN B12: Vitamin B-12: 606 pg/mL (ref 211–911)

## 2014-10-10 LAB — TSH: TSH: 0.336 u[IU]/mL — ABNORMAL LOW (ref 0.350–4.500)

## 2014-10-11 LAB — VITAMIN D 25 HYDROXY (VIT D DEFICIENCY, FRACTURES): Vit D, 25-Hydroxy: 29 ng/mL — ABNORMAL LOW (ref 30–100)

## 2015-01-09 ENCOUNTER — Ambulatory Visit: Payer: Self-pay | Admitting: Physician Assistant

## 2015-01-23 ENCOUNTER — Ambulatory Visit (INDEPENDENT_AMBULATORY_CARE_PROVIDER_SITE_OTHER): Payer: BC Managed Care – PPO | Admitting: Physician Assistant

## 2015-01-23 ENCOUNTER — Encounter: Payer: Self-pay | Admitting: Physician Assistant

## 2015-01-23 VITALS — BP 132/90 | HR 88 | Temp 98.2°F | Resp 16 | Ht 63.0 in | Wt 239.0 lb

## 2015-01-23 DIAGNOSIS — R7309 Other abnormal glucose: Secondary | ICD-10-CM

## 2015-01-23 DIAGNOSIS — I10 Essential (primary) hypertension: Secondary | ICD-10-CM

## 2015-01-23 DIAGNOSIS — E669 Obesity, unspecified: Secondary | ICD-10-CM

## 2015-01-23 DIAGNOSIS — E785 Hyperlipidemia, unspecified: Secondary | ICD-10-CM

## 2015-01-23 DIAGNOSIS — R7303 Prediabetes: Secondary | ICD-10-CM

## 2015-01-23 DIAGNOSIS — E559 Vitamin D deficiency, unspecified: Secondary | ICD-10-CM

## 2015-01-23 DIAGNOSIS — Z79899 Other long term (current) drug therapy: Secondary | ICD-10-CM | POA: Diagnosis not present

## 2015-01-23 LAB — CBC WITH DIFFERENTIAL/PLATELET
Basophils Absolute: 0 10*3/uL (ref 0.0–0.1)
Basophils Relative: 0 % (ref 0–1)
EOS ABS: 0.3 10*3/uL (ref 0.0–0.7)
Eosinophils Relative: 5 % (ref 0–5)
HCT: 39.5 % (ref 36.0–46.0)
Hemoglobin: 13.7 g/dL (ref 12.0–15.0)
Lymphocytes Relative: 45 % (ref 12–46)
Lymphs Abs: 2.5 10*3/uL (ref 0.7–4.0)
MCH: 28.1 pg (ref 26.0–34.0)
MCHC: 34.7 g/dL (ref 30.0–36.0)
MCV: 80.9 fL (ref 78.0–100.0)
MONO ABS: 0.4 10*3/uL (ref 0.1–1.0)
MONOS PCT: 7 % (ref 3–12)
MPV: 9.5 fL (ref 8.6–12.4)
Neutro Abs: 2.4 10*3/uL (ref 1.7–7.7)
Neutrophils Relative %: 43 % (ref 43–77)
PLATELETS: 314 10*3/uL (ref 150–400)
RBC: 4.88 MIL/uL (ref 3.87–5.11)
RDW: 14.3 % (ref 11.5–15.5)
WBC: 5.5 10*3/uL (ref 4.0–10.5)

## 2015-01-23 MED ORDER — SIMVASTATIN 40 MG PO TABS: ORAL_TABLET | ORAL | Status: DC

## 2015-01-23 MED ORDER — HYDROCHLOROTHIAZIDE 25 MG PO TABS: ORAL_TABLET | ORAL | Status: DC

## 2015-01-23 NOTE — Patient Instructions (Addendum)
Add vitamin C to iron and take every other day.   We want weight loss that will last so you should lose 1-2 pounds a week.  THAT IS IT! Please pick THREE things a month to change. Once it is a habit check off the item. Then pick another three items off the list to become habits.  If you are already doing a habit on the list GREAT!  Cross that item off! o Don't drink your calories. Ie, alcohol, soda, fruit juice, and sweet tea.  o Drink more water. Drink a glass when you feel hungry or before each meal.  o Eat breakfast - Complex carb and protein (likeDannon light and fit yogurt, oatmeal, fruit, eggs, [place] bacon). o Measure your cereal.  Eat no more than one cup a day. (ie [place]) o Eat an apple a day. o Add a vegetable a day. o Try a new vegetable a month. o Use Pam! Stop using oil or butter to cook. o Don't finish your plate or use smaller plates. o Share your dessert. o Eat sugar free Jello for dessert or frozen grapes. o Don't eat 2-3 hours before bed. o Switch to whole wheat bread, pasta, and brown rice. o Make healthier choices when you eat out. No fries! o Pick baked chicken, NOT fried. o Don't forget to SLOW DOWN when you eat. It is not going anywhere.  o Take the stairs. o Park far away in the parking lot o [employer] (or weights) for 10 minutes while watching TV. o Walk at work for 10 minutes during break. o Walk outside 1 time a week with your friend, kids, dog, or significant other. o Start a walking group at church. o Walk the mall as much as you can tolerate.  o Keep a food diary. o Weigh yourself daily. o Walk for 15 minutes 3 days per week. o Cook at home more often and eat out less.  If life happens and you go back to old habits, it is okay.  Just start over. You can do it!   If you experience chest pain, get short of breath, or tired during the exercise, please stop immediately and inform your doctor.   Before you even begin to attack a weight-loss plan, it  pays to remember this: You are not fat. You have fat. Losing weight isn't about blame or shame; it's simply another achievement to accomplish. Dieting is like any other skill-you have to buckle down and work at it. As long as you act in a smart, reasonable way, you'll ultimately get where you want to be. Here are some weight loss pearls for you.  1. It's Not a Diet. It's a Lifestyle Thinking of a diet as something you're on and suffering through only for the short term doesn't work. To shed weight and keep it off, you need to make permanent changes to the way you eat. It's OK to indulge occasionally, of course, but if you cut calories temporarily and then revert to your old way of eating, you'll gain back the weight quicker than you can say yo-yo. Use it to lose it. Research shows that one of the best predictors of long-term weight loss is how many pounds you drop in the first month. For that reason, nutritionists often suggest being stricter for the first two weeks of your new eating strategy to build momentum. Cut out added sugar and alcohol and avoid unrefined carbs. After that, figure out how you can reincorporate them  in a way that's healthy and maintainable.  2. There's a Right Way to Exercise Working out burns calories and fat and boosts your metabolism by building muscle. But those trying to lose weight are notorious for overestimating the number of calories they burn and underestimating the amount they take in. Unfortunately, your system is biologically programmed to hold on to extra pounds and that means when you start exercising, your body senses the deficit and ramps up its hunger signals. If you're not diligent, you'll eat everything you burn and then some. Use it to lose it. Cardio gets all the exercise glory, but strength and interval training are the real heroes. They help you build lean muscle, which in turn increases your metabolism and calorie-burning ability 3. Don't Overreact to Mild  Hunger Some people have a hard time losing weight because of hunger anxiety. To them, being hungry is bad-something to be avoided at all costs-so they carry snacks with them and eat when they don't need to. Others eat because they're stressed out or bored. While you never want to get to the point of being ravenous (that's when bingeing is likely to happen), a hunger pang, a craving, or the fact that it's 3:00 p.m. should not send you racing for the vending machine or obsessing about the energy bar in your purse. Ideally, you should put off eating until your stomach is growling and it's difficult to concentrate.  Use it to lose it. When you feel the urge to eat, use the HALT method. Ask yourself, Am I really hungry? Or am I angry or anxious, lonely or bored, or tired? If you're still not certain, try the apple test. If you're truly hungry, an apple should seem delicious; if it doesn't, something else is going on. Or you can try drinking water and making yourself busy, if you are still hungry try a healthy snack.  4. Not All Calories Are Created Equal The mechanics of weight loss are pretty simple: Take in fewer calories than you use for energy. But the kind of food you eat makes all the difference. Processed food that's high in saturated fat and refined starch or sugar can cause inflammation that disrupts the hormone signals that tell your brain you're full. The result: You eat a lot more.  Use it to lose it. Clean up your diet. Swap in whole, unprocessed foods, including vegetables, lean protein, and healthy fats that will fill you up and give you the biggest nutritional bang for your calorie buck. In a few weeks, as your brain starts receiving regular hunger and fullness signals once again, you'll notice that you feel less hungry overall and naturally start cutting back on the amount you eat.  5. Protein, Produce, and Plant-Based Fats Are Your Weight-Loss Trinity Here's why eating the three Ps regularly will  help you drop pounds. Protein fills you up. You need it to build lean muscle, which keeps your metabolism humming so that you can torch more fat. People in a weight-loss program who ate double the recommended daily allowance for protein (about 110 grams for a 150-pound woman) lost 70 percent of their weight from fat, while people who ate the RDA lost only about 40 percent, one study found. Produce is packed with filling fiber. "It's very difficult to consume too many calories if you're eating a lot of vegetables. Example: Three cups of broccoli is a lot of food, yet only 93 calories. (Fruit is another story. It can be easy to overeat and  can contain a lot of calories from sugar, so be sure to monitor your intake.) Plant-based fats like olive oil and those in avocados and nuts are healthy and extra satiating.  Use it to lose it. Aim to incorporate each of the three Ps into every meal and snack. People who eat protein throughout the day are able to keep weight off, according to a study in the American Journal of Clinical Nutrition. In addition to meat, poultry and seafood, good sources are beans, lentils, eggs, tofu, and yogurt. As for fat, keep portion sizes in check by measuring out salad dressing, oil, and nut butters (shoot for one to two tablespoons). Finally, eat veggies or a little fruit at every meal. People who did that consumed 308 fewer calories but didn't feel any hungrier than when they didn't eat more produce.  7. How You Eat Is As Important As What You Eat In order for your brain to register that you're full, you need to focus on what you're eating. Sit down whenever you eat, preferably at a table. Turn off the TV or computer, put down your phone, and look at your food. Smell it. Chew slowly, and don't put another bite on your fork until you swallow. When women ate lunch this attentively, they consumed 30 percent less when snacking later than those who listened to an audiobook at lunchtime,  according to a study in the [place] Journal of Nutrition. 8. Weighing Yourself Really Works The scale provides the best evidence about whether your efforts are paying off. Seeing the numbers tick up or down or stagnate is motivation to keep going-or to rethink your approach. A 2015 study at [facility] found that daily weigh-ins helped people lose more weight, keep it off, and maintain that loss, even after two years. Use it to lose it. Step on the scale at the same time every day for the best results. If your weight shoots up several pounds from one weigh-in to the next, don't freak out. Eating a lot of salt the night before or having your period is the likely culprit. The number should return to normal in a day or two. It's a steady climb that you need to do something about. 9. Too Much Stress and Too Little Sleep Are Your Enemies When you're tired and frazzled, your body cranks up the production of cortisol, the stress hormone that can cause carb cravings. Not getting enough sleep also boosts your levels of ghrelin, a hormone associated with hunger, while suppressing leptin, a hormone that signals fullness and satiety. People on a diet who slept only five and a half hours a night for two weeks lost 55 percent less fat and were hungrier than those who slept eight and a half hours, according to a study in the [place] [employer] Journal. Use it to lose it. Prioritize sleep, aiming for seven hours or more a night, which research shows helps lower stress. And make sure you're getting quality zzz's. If a snoring spouse or a fidgety cat wakes you up frequently throughout the night, you may end up getting the equivalent of just four hours of sleep, according to a study from [facility]. Keep pets out of the bedroom, and use a white-noise app to drown out snoring. 10. You Will Hit a plateau-And You Can Bust Through It As you slim down, your body releases much less leptin, the fullness  hormone.  If you're not strength training, start right now. Building muscle can raise your  metabolism to help you overcome a plateau. To keep your body challenged and burning calories, incorporate new moves and more intense intervals into your workouts or add another sweat session to your weekly routine. Alternatively, cut an extra 100 calories or so a day from your diet. Now that you've lost weight, your body simply doesn't need as much fuel.    Who Qualifies for Obesity Medications? Although everyone is hopeful for a fast and easy way to lose weight, nothing has been shown to replace a prudent, calorie-controlled diet along with behavior modification and an increase in physical activity as a cornerstone for all obesity treatments. Other options, such as the use of weight-loss medications, can be considered if weight-loss levels out at a still unacceptable range or if medical problems are not adequately controlled. The next tool that can be used to achieve weight-loss and health improvement is medication. Pharmacotherapy may be offered to individuals affected by obesity who have failed to achieve weight-loss through diet and exercise alone. Currently there are several drugs that are approved by the FDA for weight-loss: phentermine products (Adipex-P or Suprenza)  orlistat (Xenical or alli)  lorcaserin HCI (Belviq) (FDA approved, available)  phentermine- topiramate ER (Qsymia) (FDA approved, available)  CAN LOOK UP VYVANSE AND CONTRAVE Let's take a closer look at each of these medications and learn how they work:  Phentermine (Adipex-P or Suprenza) How does it work? Phentermine is a medication available by prescription that works on chemicals in the brain to decrease your appetite. It also has a mild stimulant component that adds extra energy. Phentermine is a pill that is taken once a day in the morning time. Tolerance to this medication can develop, so it can only be used for several  months at a time. Common side effects are dry mouth and sleeplessness. Weight-loss: The average weight-loss is 4-5 percent of your weight after one-year. In a 200 pound person, this means about 10 pounds of weight-loss. Patients who receive phentermine can usually expect to see greater weight-loss than those who receive non-pharmacologic care, on average about 13 pounds difference over 12 weeks as reported in one study. Concerns: Due to its stimulant effect, a person's blood pressure and heart rate may increase when on this medication; therefore, you must be monitored closely by a physician who is experienced in prescribing this medication. It cannot be used in patients with some heart conditions (such as poorly controlled blood pressure), glaucoma (increased pressure in your eye), stroke or overactive thyroid. There is some concern for abuse, but this is minimal if the medication is appropriately used as directed by a healthcare professional.  Orlistat (Xenical or alli) How does it work? The medication alli is a lower potency of the prescription drug Xenical (orlistat). It is the only FDA-approved weight-loss medication that is available over-the-counter and available at a higher dose with a prescription. It is a capsule that is usually taken three times per day before a meal that contains dietary fat. It works by decreasing the amount of fat your body absorbs. This means that only 2/3 of the calories that you take in from fat will be absorbed. The other 1/3 of the calories gets carried away in the digestion tract as stool. The company that makes this drug [employer]) also offers a Web site with education and support tools for users at Stream[employer].com[employer]cy. Weight-loss: The average weight-loss is about 3-4 percent of your weight after one-year. In a person who weighs 200 pounds, this would mean eight  pounds of weight-loss. Concerns: It does not work well for people who are already on  a low-fat diet since their calories from fat are already low. Individuals using alli on a regular basis should take a daily multivitamin as there is potential for deficiency in some vitamins. One of the advantages of alli is that its side effects are limited to the gastrointestinal system. Common side effects are cramps, gas, stool leakage, oily spotting and gas with discharge that improve with a lower fat diet.  Lorcaserin (Belviq) How does it work? Lorcaserin was approved in June 2012 by the FDA and became commercially available in June 2013. It works by helping you feel full while eating less, and it works on the chemicals in your brain to help decrease your appetite. Weight-loss: In individuals who took the medication for one-year, it has been shown to have an average of 7 percent weight-loss. In a 200 pound person, this would mean a 14 pound weight-loss. Blood sugar, cholesterol and blood pressure levels have also been shown to improve. Concerns: The most common side effects are headache, dizziness, fatigue, dry mouth, upper respiratory tract infection and nausea.  Phentermine-Topiramate ER (Qsymia) How does it work? This combination medication was approved by the FDA in July 2012. Topiramate is a medication used to treat seizures. It was found that a common side effect of this medication was weight-loss. Phentermine, as described in this brochure, helps to increase your energy and decrease your appetite. Weight-loss: Among individuals who took the highest does of Qsymia (15 mg phentermine and 92 mg of topiramate ER) for one-year, they achieved an average of 14.4 percent weight-loss. In a 200 pound person, a 14.4 percent weight-loss would mean a loss of 29 pounds. Cholesterol levels have also been shown to improve. Concerns: The most common side effects were dry mouth, constipation and pins-and-needle feeling in extremities. Qsymia should NOT be taken during pregnancy since Topiramate ER, a  component of Qsymia, has been associated with an increased risk of birth defects.  Follow-up Visits: Patients are given the opportunity to revisit a topic or obtain more information on an area of interest during follow-up visits. The frequency of and interval between follow-up visits is determined on a patient-by-patient basis. Frequent visits (every 3 to 4 weeks) are encouraged until initial weight-loss goals (5 to 10 percent of body weight) are achieved. At that point, less frequent visits are typically scheduled as needed for individual patients. However, since obesity is considered a chronic life-long problem for many individuals, periodic continual follow up is recommended.   Research has shown that weight-loss as low as 5 percent of initial body weight can lead to favorable improvements in blood pressure, cholesterol, glucose levels and insulin sensitivity. The risk of developing heart disease is reduced the most in patients who have impaired glucose tolerance, type 2 diabetes or high blood pressure.

## 2015-01-23 NOTE — Progress Notes (Signed)
Assessment and Plan:  Hypertension: Continue medication, monitor blood pressure at home. Continue DASH diet.  Reminder to go to the ER if any CP, SOB, nausea, dizziness, severe HA, changes vision/speech, left arm numbness and tingling, and jaw pain. Cholesterol: Continue diet and exercise. Check cholesterol.  Pre-diabetes/DM range-Continue diet and exercise. Check A1C  Vitamin D Def- check level and continue medications.  Obesity with co morbidities- long discussion about weight loss, diet, and exercise. Discussed vyvanse, other weight loss medications, did not do well with phentermine.   Continue diet and meds as discussed. Further disposition pending results of labs.  HPI 54 y.o. female  presents for 3 month follow up with hypertension, hyperlipidemia, prediabetes and vitamin D.  Her blood pressure has been controlled at home, today their BP is BP: 132/90 mmHg  She does not workout regularly due to the weather, trying to get 1/2-1 mile occ.  She denies chest pain, shortness of breath, dizziness.   She is on cholesterol medication, simvastatin 40, has been out for 2 days and denies myalgias. Her cholesterol is at goal. The cholesterol last visit was:   Lab Results  Component Value Date   CHOL 208* 10/09/2014   HDL 57 10/09/2014   LDLCALC 139* 10/09/2014   TRIG 60 10/09/2014   CHOLHDL 3.6 10/09/2014  She has been working on diet and exercise for prediabetes,  she is on bASA and denies paresthesia of the feet, polydipsia, polyuria and visual disturbances. Last A1C in the office was:  Lab Results  Component Value Date   HGBA1C 6.2* 10/09/2014  Patient is on Vitamin D supplement.   Lab Results  Component Value Date   VD25OH 29* 10/09/2014   BMI is Body mass index is 42.35 kg/(m^2)., she is working on diet and exercise. She did get engaged this past weekend.  Wt Readings from Last 3 Encounters:  01/23/15 239 lb (108.41 kg)  10/09/14 237 lb (107.502 kg)  08/09/14 234 lb (106.142 kg)     Current Medications:  Current Outpatient Prescriptions on File Prior to Visit  Medication Sig Dispense Refill  . aspirin 81 MG tablet Take 81 mg by mouth daily.    Marland Kitchen azelastine (ASTELIN) 137 MCG/SPRAY nasal spray Place 2 sprays into both nostrils 2 (two) times daily. Use in each nostril as directed    . Calcium-Magnesium-Zinc 167-83-8 MG TABS Take 1 tablet by mouth daily.    . cetirizine (ZYRTEC) 10 MG tablet Take 10 mg by mouth daily.    . cholecalciferol (VITAMIN D) 1000 UNITS tablet Take 5,000 Units by mouth daily.    . hydrochlorothiazide (HYDRODIURIL) 25 MG tablet TAKE 1 TABLET BY MOUTH EVERY DAY 90 tablet 1  . MINIVELLE 0.05 MG/24HR patch Place 1 patch onto the skin 2 (two) times a week. Monday and Fridays    . phentermine (ADIPEX-P) 37.5 MG tablet Take 1 tablet (37.5 mg total) by mouth daily before breakfast. 30 tablet 2  . simvastatin (ZOCOR) 40 MG tablet Take one tablet daily 90 tablet 1  . traMADol (ULTRAM) 50 MG tablet Take 1 tablet (50 mg total) by mouth every 6 (six) hours as needed. 15 tablet 0  . triamcinolone cream (KENALOG) 0.1 % Apply 1 application topically 2 (two) times daily. 80 g 1   No current facility-administered medications on file prior to visit.   Medical History:  Past Medical History  Diagnosis Date  . Hyperlipidemia   . Allergy   . Depression   . Migraines   . Vitamin  D deficiency   . Prediabetes   . Hypertension     Echo 2011 normal EF, trace MR  . Obesity    Allergies:  Allergies  Allergen Reactions  . Singulair [Montelukast Sodium] Swelling  . Lactose Intolerance (Gi)     Review of Systems:  Review of Systems  Constitutional: Negative.   HENT: Negative for congestion, ear discharge, ear pain, nosebleeds, sore throat and tinnitus.   Eyes: Negative.   Respiratory: Negative.  Negative for stridor.   Cardiovascular: Negative.   Gastrointestinal: Positive for constipation (on probiotic that helps). Negative for heartburn, nausea, vomiting,  abdominal pain, diarrhea, blood in stool and melena.  Genitourinary: Negative.   Musculoskeletal: Negative.   Skin: Negative for itching and rash.  Neurological: Negative.  Negative for headaches.  Endo/Heme/Allergies: Negative.   Psychiatric/Behavioral: Negative.     Family history- Review and unchanged Social history- Review and unchanged Physical Exam: BP 132/90 mmHg  Pulse 88  Temp(Src) 98.2 F (36.8 C)  Resp 16  Ht  (1.6 m)  Wt 239 lb (108.41 kg)  BMI 42.35 kg/m2 Wt Readings from Last 3 Encounters:  01/23/15 239 lb (108.41 kg)  10/09/14 237 lb (107.502 kg)  08/09/14 234 lb (106.142 kg)   General Appearance: Well nourished, in no apparent distress. Eyes: PERRLA, EOMs, conjunctiva no swelling or erythema Sinuses: No Frontal/maxillary tenderness ENT/Mouth: Ext aud canals clear, TMs without erythema, bulging. No erythema, swelling, or exudate on post pharynx.  Tonsils not swollen or erythematous. Hearing normal.  Neck: Supple, thyroid normal.  Respiratory: Respiratory effort normal, BS equal bilaterally without rales, rhonchi, wheezing or stridor.  Cardio: RRR with no MRGs. Brisk peripheral pulses without edema.  Abdomen: Soft, + BS.  Non tender, no guarding, rebound, hernias, masses. Lymphatics: Non tender without lymphadenopathy.  Musculoskeletal: Full ROM, 5/5 strength, normal gait.  Skin: Varicose veins. Warm, dry without lesions, ecchymosis.  Neuro: Cranial nerves intact. Normal muscle tone, no cerebellar symptoms. Sensation intact.  Psych: Awake and oriented X 3, normal affect, Insight and Judgment appropriate.    Quentin Mulling, PA-C 11:42 AM [facility]

## 2015-01-24 LAB — HEMOGLOBIN A1C
HEMOGLOBIN A1C: 6.3 % — AB (ref <5.7)
MEAN PLASMA GLUCOSE: 134 mg/dL — AB (ref <117)

## 2015-01-24 LAB — HEPATIC FUNCTION PANEL
ALBUMIN: 4.3 g/dL (ref 3.6–5.1)
ALK PHOS: 73 U/L (ref 33–130)
ALT: 13 U/L (ref 6–29)
AST: 16 U/L (ref 10–35)
BILIRUBIN INDIRECT: 0.4 mg/dL (ref 0.2–1.2)
Bilirubin, Direct: 0.1 mg/dL (ref <=0.2)
TOTAL PROTEIN: 7.4 g/dL (ref 6.1–8.1)
Total Bilirubin: 0.5 mg/dL (ref 0.2–1.2)

## 2015-01-24 LAB — LIPID PANEL
Cholesterol: 179 mg/dL (ref 125–200)
HDL: 51 mg/dL (ref >=46)
LDL CALC: 109 mg/dL (ref <130)
Total CHOL/HDL Ratio: 3.5 Ratio (ref <=5.0)
Triglycerides: 95 mg/dL (ref <150)
VLDL: 19 mg/dL (ref <30)

## 2015-01-24 LAB — TSH: TSH: 0.342 u[IU]/mL — ABNORMAL LOW (ref 0.350–4.500)

## 2015-01-24 LAB — BASIC METABOLIC PANEL WITH GFR
BUN: 14 mg/dL (ref 7–25)
CALCIUM: 9.6 mg/dL (ref 8.6–10.4)
CO2: 29 mmol/L (ref 20–31)
CREATININE: 0.89 mg/dL (ref 0.50–1.05)
Chloride: 97 mmol/L — ABNORMAL LOW (ref 98–110)
GFR, EST AFRICAN AMERICAN: 86 mL/min (ref >=60)
GFR, EST NON AFRICAN AMERICAN: 75 mL/min (ref >=60)
Glucose, Bld: 86 mg/dL (ref 65–99)
Potassium: 3.5 mmol/L (ref 3.5–5.3)
SODIUM: 139 mmol/L (ref 135–146)

## 2015-01-24 LAB — MAGNESIUM: MAGNESIUM: 1.8 mg/dL (ref 1.5–2.5)

## 2015-01-24 LAB — INSULIN, FASTING: INSULIN FASTING, SERUM: 14.9 u[IU]/mL (ref 2.0–19.6)

## 2015-01-24 LAB — VITAMIN D 25 HYDROXY (VIT D DEFICIENCY, FRACTURES): Vit D, 25-Hydroxy: 28 ng/mL — ABNORMAL LOW (ref 30–100)

## 2015-04-29 ENCOUNTER — Encounter: Payer: Self-pay | Admitting: Physician Assistant

## 2015-04-29 ENCOUNTER — Ambulatory Visit (INDEPENDENT_AMBULATORY_CARE_PROVIDER_SITE_OTHER): Payer: BC Managed Care – PPO | Admitting: Physician Assistant

## 2015-04-29 VITALS — BP 130/82 | HR 67 | Temp 97.7°F | Resp 16 | Ht 63.0 in | Wt 245.0 lb

## 2015-04-29 DIAGNOSIS — E669 Obesity, unspecified: Secondary | ICD-10-CM | POA: Diagnosis not present

## 2015-04-29 DIAGNOSIS — E0590 Thyrotoxicosis, unspecified without thyrotoxic crisis or storm: Secondary | ICD-10-CM

## 2015-04-29 DIAGNOSIS — E785 Hyperlipidemia, unspecified: Secondary | ICD-10-CM

## 2015-04-29 DIAGNOSIS — F32A Depression, unspecified: Secondary | ICD-10-CM

## 2015-04-29 DIAGNOSIS — Z79899 Other long term (current) drug therapy: Secondary | ICD-10-CM | POA: Diagnosis not present

## 2015-04-29 DIAGNOSIS — F329 Major depressive disorder, single episode, unspecified: Secondary | ICD-10-CM | POA: Diagnosis not present

## 2015-04-29 DIAGNOSIS — I10 Essential (primary) hypertension: Secondary | ICD-10-CM | POA: Diagnosis not present

## 2015-04-29 DIAGNOSIS — E559 Vitamin D deficiency, unspecified: Secondary | ICD-10-CM | POA: Diagnosis not present

## 2015-04-29 DIAGNOSIS — R7303 Prediabetes: Secondary | ICD-10-CM | POA: Diagnosis not present

## 2015-04-29 LAB — CBC WITH DIFFERENTIAL/PLATELET
BASOS ABS: 0 10*3/uL (ref 0.0–0.1)
BASOS PCT: 0 % (ref 0–1)
EOS ABS: 0.4 10*3/uL (ref 0.0–0.7)
Eosinophils Relative: 6 % — ABNORMAL HIGH (ref 0–5)
HCT: 40.7 % (ref 36.0–46.0)
Hemoglobin: 13.5 g/dL (ref 12.0–15.0)
LYMPHS ABS: 2.8 10*3/uL (ref 0.7–4.0)
Lymphocytes Relative: 47 % — ABNORMAL HIGH (ref 12–46)
MCH: 27.4 pg (ref 26.0–34.0)
MCHC: 33.2 g/dL (ref 30.0–36.0)
MCV: 82.6 fL (ref 78.0–100.0)
MPV: 9.8 fL (ref 8.6–12.4)
Monocytes Absolute: 0.4 10*3/uL (ref 0.1–1.0)
Monocytes Relative: 7 % (ref 3–12)
NEUTROS PCT: 40 % — AB (ref 43–77)
Neutro Abs: 2.4 10*3/uL (ref 1.7–7.7)
PLATELETS: 333 10*3/uL (ref 150–400)
RBC: 4.93 MIL/uL (ref 3.87–5.11)
RDW: 14.2 % (ref 11.5–15.5)
WBC: 6 10*3/uL (ref 4.0–10.5)

## 2015-04-29 LAB — HEPATIC FUNCTION PANEL
ALK PHOS: 68 U/L (ref 33–130)
ALT: 16 U/L (ref 6–29)
AST: 17 U/L (ref 10–35)
Albumin: 4.1 g/dL (ref 3.6–5.1)
BILIRUBIN DIRECT: 0.1 mg/dL (ref <=0.2)
BILIRUBIN TOTAL: 0.4 mg/dL (ref 0.2–1.2)
Indirect Bilirubin: 0.3 mg/dL (ref 0.2–1.2)
TOTAL PROTEIN: 7.5 g/dL (ref 6.1–8.1)

## 2015-04-29 LAB — LIPID PANEL
Cholesterol: 193 mg/dL (ref 125–200)
HDL: 54 mg/dL (ref >=46)
LDL Cholesterol: 116 mg/dL (ref <130)
Total CHOL/HDL Ratio: 3.6 Ratio (ref <=5.0)
Triglycerides: 113 mg/dL (ref <150)
VLDL: 23 mg/dL (ref <30)

## 2015-04-29 LAB — BASIC METABOLIC PANEL WITH GFR
BUN: 15 mg/dL (ref 7–25)
CHLORIDE: 101 mmol/L (ref 98–110)
CO2: 29 mmol/L (ref 20–31)
Calcium: 9.9 mg/dL (ref 8.6–10.4)
Creat: 0.95 mg/dL (ref 0.50–1.05)
GFR, EST NON AFRICAN AMERICAN: 69 mL/min (ref >=60)
GFR, Est African American: 79 mL/min (ref >=60)
Glucose, Bld: 98 mg/dL (ref 65–99)
POTASSIUM: 3.9 mmol/L (ref 3.5–5.3)
SODIUM: 138 mmol/L (ref 135–146)

## 2015-04-29 LAB — TSH: TSH: 0.259 u[IU]/mL — ABNORMAL LOW (ref 0.350–4.500)

## 2015-04-29 LAB — HEMOGLOBIN A1C
HEMOGLOBIN A1C: 6.4 % — AB (ref <5.7)
MEAN PLASMA GLUCOSE: 137 mg/dL — AB (ref <117)

## 2015-04-29 LAB — MAGNESIUM: MAGNESIUM: 2 mg/dL (ref 1.5–2.5)

## 2015-04-29 MED ORDER — BUPROPION HCL ER (XL) 150 MG PO TB24: 150.0000 mg | ORAL_TABLET | ORAL | Status: DC

## 2015-04-29 NOTE — Progress Notes (Addendum)
Assessment and Plan:  Hypertension: Continue medication, monitor blood pressure at home. Continue DASH diet.  Reminder to go to the ER if any CP, SOB, nausea, dizziness, severe HA, changes vision/speech, left arm numbness and tingling, and jaw pain. Cholesterol: Continue diet and exercise. Check cholesterol.  Pre-diabetes/DM range-Continue diet and exercise. Check A1C  Vitamin D Def- check level and continue medications.  Obesity with co morbidities with some depression- long discussion about weight loss, diet, and exercise. Discussed vyvanse/wellbutrin, will start wellbutrin and if this does not help will try vyvanse for over eating, other weight loss medications, did not do well with phentermine.    Continue diet and meds as discussed. Further disposition pending results of labs.  Addendum: Will get thyroid RAI uptake due to hyperthyroidism, fatigue/anxiety- rule out inflammation versus overactive thyroid.   HPI 54 y.o. female  presents for 3 month follow up with hypertension, hyperlipidemia, prediabetes and vitamin D.  Her blood pressure has been controlled at home, today their BP is BP: 130/82 mmHg  She does not workout regularly due to the weather. She denies chest pain, shortness of breath, dizziness.   She is on cholesterol medication, simvastatin 40, and denies myalgias. Her cholesterol is at goal. The cholesterol last visit was:   Lab Results  Component Value Date   CHOL 179 01/23/2015   HDL 51 01/23/2015   LDLCALC 109 01/23/2015   TRIG 95 01/23/2015   CHOLHDL 3.5 01/23/2015  She has been working on diet and exercise for prediabetes,  she is on bASA and denies paresthesia of the feet, polydipsia, polyuria and visual disturbances. Last A1C in the office was:  Lab Results  Component Value Date   HGBA1C 6.3* 01/23/2015  Patient is on Vitamin D supplement.   Lab Results  Component Value Date   VD25OH 28* 01/23/2015   BMI is Body mass index is 43.41 kg/(m^2)., she is working on  diet and exercise. She states that she has been grieving due to loss of her mother , did do counseling and she is has been increasing stress eating due to this. She started on MVT, vitafusion for women. Has been on phentermine and topamax in the past without help.  Wt Readings from Last 3 Encounters:  04/29/15 245 lb (111.131 kg)  01/23/15 239 lb (108.41 kg)  10/09/14 237 lb (107.502 kg)    Current Medications:  Current Outpatient Prescriptions on File Prior to Visit  Medication Sig Dispense Refill  . aspirin 81 MG tablet Take 81 mg by mouth daily.    Marland Kitchen. azelastine (ASTELIN) 137 MCG/SPRAY nasal spray Place 2 sprays into both nostrils 2 (two) times daily. Use in each nostril as directed    . Calcium-Magnesium-Zinc 167-83-8 MG TABS Take 1 tablet by mouth daily.    . cetirizine (ZYRTEC) 10 MG tablet Take 10 mg by mouth daily.    . cholecalciferol (VITAMIN D) 1000 UNITS tablet Take 5,000 Units by mouth daily.    . hydrochlorothiazide (HYDRODIURIL) 25 MG tablet TAKE 1 TABLET BY MOUTH EVERY DAY 90 tablet 1  . MINIVELLE 0.05 MG/24HR patch Place 1 patch onto the skin 2 (two) times a week. Monday and Fridays    . simvastatin (ZOCOR) 40 MG tablet Take one tablet daily 90 tablet 1  . triamcinolone cream (KENALOG) 0.1 % Apply 1 application topically 2 (two) times daily. 80 g 1   No current facility-administered medications on file prior to visit.   Medical History:  Past Medical History  Diagnosis Date  .  Hyperlipidemia   . Allergy   . Depression   . Migraines   . Vitamin D deficiency   . Prediabetes   . Hypertension     Echo 2011 normal EF, trace MR  . Obesity    Allergies:  Allergies  Allergen Reactions  . Singulair [Montelukast Sodium] Swelling  . Lactose Intolerance (Gi)     Review of Systems:  Review of Systems  Constitutional: Negative.   HENT: Negative for congestion, ear discharge, ear pain, nosebleeds, sore throat and tinnitus.   Eyes: Negative.   Respiratory: Negative.   Negative for stridor.   Cardiovascular: Negative.   Gastrointestinal: Positive for constipation (on probiotic that helps). Negative for heartburn, nausea, vomiting, abdominal pain, diarrhea, blood in stool and melena.  Genitourinary: Negative.   Musculoskeletal: Negative.   Skin: Negative for itching and rash.  Neurological: Negative.  Negative for headaches.  Endo/Heme/Allergies: Negative.   Psychiatric/Behavioral: Positive for depression.    Family history- Review and unchanged Social history- Review and unchanged Physical Exam: BP 130/82 mmHg  Pulse 67  Temp(Src) 97.7 F (36.5 C) (Temporal)  Resp 16  Ht  (1.6 m)  Wt 245 lb (111.131 kg)  BMI 43.41 kg/m2  SpO2 93% Wt Readings from Last 3 Encounters:  04/29/15 245 lb (111.131 kg)  01/23/15 239 lb (108.41 kg)  10/09/14 237 lb (107.502 kg)   General Appearance: Well nourished, in no apparent distress. Eyes: PERRLA, EOMs, conjunctiva no swelling or erythema Sinuses: No Frontal/maxillary tenderness ENT/Mouth: Ext aud canals clear, TMs without erythema, bulging. No erythema, swelling, or exudate on post pharynx.  Tonsils not swollen or erythematous. Hearing normal.  Neck: Supple, thyroid normal.  Respiratory: Respiratory effort normal, BS equal bilaterally without rales, rhonchi, wheezing or stridor.  Cardio: RRR with no MRGs. Brisk peripheral pulses without edema.  Abdomen: Soft, + BS.  Non tender, no guarding, rebound, hernias, masses. Lymphatics: Non tender without lymphadenopathy.  Musculoskeletal: Full ROM, 5/5 strength, normal gait.  Skin: Varicose veins. Warm, dry without lesions, ecchymosis.  Neuro: Cranial nerves intact. Normal muscle tone, no cerebellar symptoms. Sensation intact.  Psych: Awake and oriented X 3, normal affect, Insight and Judgment appropriate.    Quentin Mulling, PA-C 3:15 PM [facility]

## 2015-04-29 NOTE — Patient Instructions (Signed)
We want weight loss that will last so you should lose 1-2 pounds a week.  THAT IS IT! Please pick THREE things a month to change. Once it is a habit check off the item. Then pick another three items off the list to become habits.  If you are already doing a habit on the list GREAT!  Cross that item off! o Don't drink your calories. Ie, alcohol, soda, fruit juice, and sweet tea.  o Drink more water. Drink a glass when you feel hungry or before each meal.  o Eat breakfast - Complex carb and protein (likeDannon light and fit yogurt, oatmeal, fruit, eggs, [place] bacon). o Measure your cereal.  Eat no more than one cup a day. (ie [place]) o Eat an apple a day. o Add a vegetable a day. o Try a new vegetable a month. o Use Pam! Stop using oil or butter to cook. o Don't finish your plate or use smaller plates. o Share your dessert. o Eat sugar free Jello for dessert or frozen grapes. o Don't eat 2-3 hours before bed. o Switch to whole wheat bread, pasta, and brown rice. o Make healthier choices when you eat out. No fries! o Pick baked chicken, NOT fried. o Don't forget to SLOW DOWN when you eat. It is not going anywhere.  o Take the stairs. o Park far away in the parking lot o [employer] (or weights) for 10 minutes while watching TV. o Walk at work for 10 minutes during break. o Walk outside 1 time a week with your friend, kids, dog, or significant other. o Start a walking group at church. o Walk the mall as much as you can tolerate.  o Keep a food diary. o Weigh yourself daily. o Walk for 15 minutes 3 days per week. o Cook at home more often and eat out less.  If life happens and you go back to old habits, it is okay.  Just start over. You can do it!   If you experience chest pain, get short of breath, or tired during the exercise, please stop immediately and inform your doctor.      Bad carbs also include fruit juice, alcohol, and sweet tea. These are empty calories that do not signal  to your brain that you are full.   Please remember the good carbs are still carbs which convert into sugar. So please measure them out no more than 1/2-1 cup of rice, oatmeal, pasta, and beans  Veggies are however free foods! Pile them on.   Not all fruit is created equal. Please see the list below, the fruit at the bottom is higher in sugars than the fruit at the top. Please avoid all dried fruits.     Before you even begin to attack a weight-loss plan, it pays to remember this: You are not fat. You have fat. Losing weight isn't about blame or shame; it's simply another achievement to accomplish. Dieting is like any other skill-you have to buckle down and work at it. As long as you act in a smart, reasonable way, you'll ultimately get where you want to be. Here are some weight loss pearls for you.  1. It's Not a Diet. It's a Lifestyle Thinking of a diet as something you're on and suffering through only for the short term doesn't work. To shed weight and keep it off, you need to make permanent changes to the way you eat. It's OK to indulge occasionally, of course, but  if you cut calories temporarily and then revert to your old way of eating, you'll gain back the weight quicker than you can say yo-yo. Use it to lose it. Research shows that one of the best predictors of long-term weight loss is how many pounds you drop in the first month. For that reason, nutritionists often suggest being stricter for the first two weeks of your new eating strategy to build momentum. Cut out added sugar and alcohol and avoid unrefined carbs. After that, figure out how you can reincorporate them in a way that's healthy and maintainable.  2. There's a Right Way to Exercise Working out burns calories and fat and boosts your metabolism by building muscle. But those trying to lose weight are notorious for overestimating the number of calories they burn and underestimating the amount they take in. Unfortunately, your system is  biologically programmed to hold on to extra pounds and that means when you start exercising, your body senses the deficit and ramps up its hunger signals. If you're not diligent, you'll eat everything you burn and then some. Use it to lose it. Cardio gets all the exercise glory, but strength and interval training are the real heroes. They help you build lean muscle, which in turn increases your metabolism and calorie-burning ability 3. Don't Overreact to Mild Hunger Some people have a hard time losing weight because of hunger anxiety. To them, being hungry is bad-something to be avoided at all costs-so they carry snacks with them and eat when they don't need to. Others eat because they're stressed out or bored. While you never want to get to the point of being ravenous (that's when bingeing is likely to happen), a hunger pang, a craving, or the fact that it's 3:00 p.m. should not send you racing for the vending machine or obsessing about the energy bar in your purse. Ideally, you should put off eating until your stomach is growling and it's difficult to concentrate.  Use it to lose it. When you feel the urge to eat, use the HALT method. Ask yourself, Am I really hungry? Or am I angry or anxious, lonely or bored, or tired? If you're still not certain, try the apple test. If you're truly hungry, an apple should seem delicious; if it doesn't, something else is going on. Or you can try drinking water and making yourself busy, if you are still hungry try a healthy snack.  4. Not All Calories Are Created Equal The mechanics of weight loss are pretty simple: Take in fewer calories than you use for energy. But the kind of food you eat makes all the difference. Processed food that's high in saturated fat and refined starch or sugar can cause inflammation that disrupts the hormone signals that tell your brain you're full. The result: You eat a lot more.  Use it to lose it. Clean up your diet. Swap in whole, unprocessed  foods, including vegetables, lean protein, and healthy fats that will fill you up and give you the biggest nutritional bang for your calorie buck. In a few weeks, as your brain starts receiving regular hunger and fullness signals once again, you'll notice that you feel less hungry overall and naturally start cutting back on the amount you eat.  5. Protein, Produce, and Plant-Based Fats Are Your Weight-Loss Trinity Here's why eating the three Ps regularly will help you drop pounds. Protein fills you up. You need it to build lean muscle, which keeps your metabolism humming so that you can  torch more fat. People in a weight-loss program who ate double the recommended daily allowance for protein (about 110 grams for a 150-pound woman) lost 70 percent of their weight from fat, while people who ate the RDA lost only about 40 percent, one study found. Produce is packed with filling fiber. "It's very difficult to consume too many calories if you're eating a lot of vegetables. Example: Three cups of broccoli is a lot of food, yet only 93 calories. (Fruit is another story. It can be easy to overeat and can contain a lot of calories from sugar, so be sure to monitor your intake.) Plant-based fats like olive oil and those in avocados and nuts are healthy and extra satiating.  Use it to lose it. Aim to incorporate each of the three Ps into every meal and snack. People who eat protein throughout the day are able to keep weight off, according to a study in the American Journal of Clinical Nutrition. In addition to meat, poultry and seafood, good sources are beans, lentils, eggs, tofu, and yogurt. As for fat, keep portion sizes in check by measuring out salad dressing, oil, and nut butters (shoot for one to two tablespoons). Finally, eat veggies or a little fruit at every meal. People who did that consumed 308 fewer calories but didn't feel any hungrier than when they didn't eat more produce.  7. How You Eat Is As Important  As What You Eat In order for your brain to register that you're full, you need to focus on what you're eating. Sit down whenever you eat, preferably at a table. Turn off the TV or computer, put down your phone, and look at your food. Smell it. Chew slowly, and don't put another bite on your fork until you swallow. When women ate lunch this attentively, they consumed 30 percent less when snacking later than those who listened to an audiobook at lunchtime, according to a study in the [place] Journal of Nutrition. 8. Weighing Yourself Really Works The scale provides the best evidence about whether your efforts are paying off. Seeing the numbers tick up or down or stagnate is motivation to keep going-or to rethink your approach. A 2015 study at [facility] found that daily weigh-ins helped people lose more weight, keep it off, and maintain that loss, even after two years. Use it to lose it. Step on the scale at the same time every day for the best results. If your weight shoots up several pounds from one weigh-in to the next, don't freak out. Eating a lot of salt the night before or having your period is the likely culprit. The number should return to normal in a day or two. It's a steady climb that you need to do something about. 9. Too Much Stress and Too Little Sleep Are Your Enemies When you're tired and frazzled, your body cranks up the production of cortisol, the stress hormone that can cause carb cravings. Not getting enough sleep also boosts your levels of ghrelin, a hormone associated with hunger, while suppressing leptin, a hormone that signals fullness and satiety. People on a diet who slept only five and a half hours a night for two weeks lost 55 percent less fat and were hungrier than those who slept eight and a half hours, according to a study in the [place] Medical Association Journal. Use it to lose it. Prioritize sleep, aiming for seven hours or more a night, which research shows helps  lower stress. And make sure you're  getting quality zzz's. If a snoring spouse or a fidgety cat wakes you up frequently throughout the night, you may end up getting the equivalent of just four hours of sleep, according to a study from [facility] [employer]. Keep pets out of the bedroom, and use a white-noise app to drown out snoring. 10. You Will Hit a plateau-And You Can Bust Through It As you slim down, your body releases much less leptin, the fullness hormone.  If you're not strength training, start right now. Building muscle can raise your metabolism to help you overcome a plateau. To keep your body challenged and burning calories, incorporate new moves and more intense intervals into your workouts or add another sweat session to your weekly routine. Alternatively, cut an extra 100 calories or so a day from your diet. Now that you've lost weight, your body simply doesn't need as much fuel.

## 2015-04-30 ENCOUNTER — Encounter: Payer: Self-pay | Admitting: Physician Assistant

## 2015-04-30 DIAGNOSIS — E0590 Thyrotoxicosis, unspecified without thyrotoxic crisis or storm: Secondary | ICD-10-CM | POA: Insufficient documentation

## 2015-04-30 LAB — VITAMIN D 25 HYDROXY (VIT D DEFICIENCY, FRACTURES): VIT D 25 HYDROXY: 28 ng/mL — AB (ref 30–100)

## 2015-04-30 NOTE — Addendum Note (Signed)
Addended by: Doree AlbeeOLLIER, Oluwatoni Rotunno R on: 04/30/2015 08:32 AM   Modules accepted: Orders

## 2015-05-08 ENCOUNTER — Encounter (HOSPITAL_COMMUNITY)
Admission: RE | Admit: 2015-05-08 | Discharge: 2015-05-08 | Disposition: A | Discharge status: Home / Self Care | Payer: BC Managed Care – PPO | Source: Ambulatory Visit | Attending: Physician Assistant | Admitting: Physician Assistant

## 2015-05-08 DIAGNOSIS — E0590 Thyrotoxicosis, unspecified without thyrotoxic crisis or storm: Secondary | ICD-10-CM | POA: Insufficient documentation

## 2015-05-08 MED ORDER — SODIUM IODIDE I 131 CAPSULE
6.0000 | Freq: Once | INTRAVENOUS | Status: AC | PRN
  Administered 2015-05-08: 6 via ORAL

## 2015-05-09 ENCOUNTER — Encounter: Payer: Self-pay | Admitting: Physician Assistant

## 2015-05-09 ENCOUNTER — Encounter (HOSPITAL_COMMUNITY)
Admission: RE | Admit: 2015-05-09 | Discharge: 2015-05-09 | Disposition: A | Discharge status: Home / Self Care | Payer: BC Managed Care – PPO | Source: Ambulatory Visit | Attending: Physician Assistant | Admitting: Physician Assistant

## 2015-05-09 DIAGNOSIS — E0590 Thyrotoxicosis, unspecified without thyrotoxic crisis or storm: Secondary | ICD-10-CM | POA: Diagnosis present

## 2015-05-09 MED ORDER — SODIUM PERTECHNETATE TC 99M INJECTION
10.1000 | Freq: Once | INTRAVENOUS | Status: AC | PRN
  Administered 2015-05-09: 10.1 via INTRAVENOUS

## 2015-05-11 ENCOUNTER — Other Ambulatory Visit: Payer: Self-pay | Admitting: Physician Assistant

## 2015-05-11 DIAGNOSIS — E041 Nontoxic single thyroid nodule: Secondary | ICD-10-CM

## 2015-05-16 ENCOUNTER — Ambulatory Visit (HOSPITAL_COMMUNITY)
Admission: RE | Admit: 2015-05-16 | Discharge: 2015-05-16 | Disposition: A | Discharge status: Home / Self Care | Payer: BC Managed Care – PPO | Source: Ambulatory Visit | Attending: Physician Assistant | Admitting: Physician Assistant

## 2015-05-16 ENCOUNTER — Other Ambulatory Visit: Payer: Self-pay | Admitting: Physician Assistant

## 2015-05-16 DIAGNOSIS — E0590 Thyrotoxicosis, unspecified without thyrotoxic crisis or storm: Secondary | ICD-10-CM | POA: Insufficient documentation

## 2015-05-16 DIAGNOSIS — E041 Nontoxic single thyroid nodule: Secondary | ICD-10-CM

## 2015-05-19 ENCOUNTER — Encounter: Payer: Self-pay | Admitting: Internal Medicine

## 2015-05-20 ENCOUNTER — Encounter: Payer: Self-pay | Admitting: Physician Assistant

## 2015-05-20 ENCOUNTER — Other Ambulatory Visit: Payer: Self-pay | Admitting: Physician Assistant

## 2015-05-20 MED ORDER — ALPRAZOLAM 0.5 MG PO TABS: 0.5000 mg | ORAL_TABLET | Freq: Three times a day (TID) | ORAL | Status: AC | PRN

## 2015-05-20 NOTE — Progress Notes (Signed)
Rx called into [facility]. 

## 2015-05-29 ENCOUNTER — Ambulatory Visit
Admission: RE | Admit: 2015-05-29 | Discharge: 2015-05-29 | Disposition: A | Discharge status: Home / Self Care | Payer: BC Managed Care – PPO | Source: Ambulatory Visit | Attending: Physician Assistant | Admitting: Physician Assistant

## 2015-05-29 ENCOUNTER — Other Ambulatory Visit (HOSPITAL_COMMUNITY)
Admission: RE | Admit: 2015-05-29 | Discharge: 2015-05-29 | Disposition: A | Discharge status: Home / Self Care | Payer: BC Managed Care – PPO | Source: Ambulatory Visit | Attending: Radiology | Admitting: Radiology

## 2015-05-29 DIAGNOSIS — E0590 Thyrotoxicosis, unspecified without thyrotoxic crisis or storm: Secondary | ICD-10-CM

## 2015-05-29 DIAGNOSIS — E041 Nontoxic single thyroid nodule: Secondary | ICD-10-CM

## 2015-07-04 ENCOUNTER — Ambulatory Visit: Payer: Self-pay | Admitting: Physician Assistant

## 2015-07-08 ENCOUNTER — Ambulatory Visit (INDEPENDENT_AMBULATORY_CARE_PROVIDER_SITE_OTHER): Payer: BC Managed Care – PPO | Admitting: Physician Assistant

## 2015-07-08 ENCOUNTER — Encounter: Payer: Self-pay | Admitting: Physician Assistant

## 2015-07-08 VITALS — BP 130/70 | HR 91 | Temp 97.4°F | Resp 16 | Ht 63.0 in | Wt 241.8 lb

## 2015-07-08 DIAGNOSIS — E669 Obesity, unspecified: Secondary | ICD-10-CM | POA: Diagnosis not present

## 2015-07-08 DIAGNOSIS — E0590 Thyrotoxicosis, unspecified without thyrotoxic crisis or storm: Secondary | ICD-10-CM

## 2015-07-08 MED ORDER — BENZONATATE 200 MG PO CAPS: 200.0000 mg | ORAL_CAPSULE | Freq: Three times a day (TID) | ORAL | Status: DC | PRN

## 2015-07-08 NOTE — Progress Notes (Signed)
Assessment and Plan: Obesity- continue weight loss Anxiety- continue wellbutrin Cough- will send in tessalon and do allergy pill  Getting married June 4th  HPI 55 y.o.female presents for follow up.  BMI is Body mass index is 42.84 kg/(m^2)., she is working on diet and exercise. She is on wellbutrin x 2 months, states that she has dry mouth from it but it is not helping. Walking daily, gets 10,000 steps daily. Writing what she is eating. She has only taken 2-3 of the xanax as needed.  Wt Readings from Last 3 Encounters:  07/08/15 241 lb 12.8 oz (109.68 kg)  04/29/15 245 lb (111.131 kg)  01/23/15 239 lb (108.41 kg)    Past Medical History  Diagnosis Date  . Hyperlipidemia   . Allergy   . Depression   . Migraines   . Vitamin D deficiency   . Prediabetes   . Hypertension     Echo 2011 normal EF, trace MR  . Obesity      Allergies  Allergen Reactions  . Singulair [Montelukast Sodium] Swelling  . Lactose Intolerance (Gi)       Current Outpatient Prescriptions on File Prior to Visit  Medication Sig Dispense Refill  . ALPRAZolam (XANAX) 0.5 MG tablet Take 1 tablet (0.5 mg total) by mouth 3 (three) times daily as needed for sleep or anxiety. 30 tablet 0  . aspirin 81 MG tablet Take 81 mg by mouth daily.    Marland Kitchen azelastine (ASTELIN) 137 MCG/SPRAY nasal spray Place 2 sprays into both nostrils 2 (two) times daily. Use in each nostril as directed    . buPROPion (WELLBUTRIN XL) 150 MG 24 hr tablet Take 1 tablet (150 mg total) by mouth every morning. 30 tablet 2  . Calcium-Magnesium-Zinc 167-83-8 MG TABS Take 1 tablet by mouth daily.    . cetirizine (ZYRTEC) 10 MG tablet Take 10 mg by mouth daily.    . cholecalciferol (VITAMIN D) 1000 UNITS tablet Take 5,000 Units by mouth daily.    . hydrochlorothiazide (HYDRODIURIL) 25 MG tablet TAKE 1 TABLET BY MOUTH EVERY DAY 90 tablet 1  . MINIVELLE 0.05 MG/24HR patch Place 1 patch onto the skin 2 (two) times a week. Monday and Fridays    .  simvastatin (ZOCOR) 40 MG tablet Take one tablet daily 90 tablet 1  . triamcinolone cream (KENALOG) 0.1 % Apply 1 application topically 2 (two) times daily. 80 g 1   No current facility-administered medications on file prior to visit.    ROS: all negative except above.   Physical Exam: Filed Weights   07/08/15 1133  Weight: 241 lb 12.8 oz (109.68 kg)   BP 130/70 mmHg  Pulse 91  Temp(Src) 97.4 F (36.3 C) (Temporal)  Resp 16  Ht  (1.6 m)  Wt 241 lb 12.8 oz (109.68 kg)  BMI 42.84 kg/m2  SpO2 98% General Appearance: Well nourished, in no apparent distress. Eyes: PERRLA, EOMs, conjunctiva no swelling or erythema Sinuses: No Frontal/maxillary tenderness ENT/Mouth: Ext aud canals clear, TMs without erythema, bulging. No erythema, swelling, or exudate on post pharynx.  Tonsils not swollen or erythematous. Hearing normal.  Neck: Supple, thyroid normal.  Respiratory: Respiratory effort normal, BS equal bilaterally without rales, rhonchi, wheezing or stridor.  Cardio: RRR with no MRGs. Brisk peripheral pulses without edema.  Abdomen: Soft, + BS.  Non tender, no guarding, rebound, hernias, masses. Lymphatics: Non tender without lymphadenopathy.  Musculoskeletal: Full ROM, 5/5 strength, normal gait.  Skin: Warm, dry without rashes, lesions, ecchymosis.  Neuro: Cranial nerves intact. Normal muscle tone, no cerebellar symptoms. Sensation intact.  Psych: Awake and oriented X 3, normal affect, Insight and Judgment appropriate.     Quentin MullingAmanda Delrico Minehart, PA-C 11:52 AM [facility]

## 2015-07-08 NOTE — Patient Instructions (Addendum)
Continue on wellbutrin  Please pick one of the over the counter allergy medications below and take it once daily for allergies.  Claritin or loratadine cheapest but likely the weakest  Zyrtec or certizine at night because it can make you sleepy The strongest is allegra or fexafinadine  Cheapest at [facility], [employer], [employer]  HOW TO TREAT VIRAL COUGH AND COLD SYMPTOMS:  -Symptoms usually last at least 1 week with the worst symptoms being around day 4.  - colds usually start with a sore throat and end with a cough, and the cough can take 2 weeks to get better.  -No antibiotics are needed for colds, flu, sore throats, cough, bronchitis UNLESS symptoms are longer than 7 days OR if you are getting better then get drastically worse.  -There are a lot of combination medications (Dayquil, Nyquil, Vicks 44, tyelnol cold and sinus, ETC). Please look at the ingredients on the back so that you are treating the correct symptoms and not doubling up on medications/ingredients.    Medicines you can use  Nasal congestion  - pseudoephedrine (Sudafed)- behind the counter, do not use if you have high blood pressure, medicine that have -D in them.  - phenylephrine (Sudafed PE) -Dextormethorphan + chlorpheniramine (Coridcidin HBP)- okay if you have high blood pressure -Oxymetazoline (Afrin) nasal spray- LIMIT to 3 days -Saline nasal spray -Neti pot (used distilled or bottled water)  Ear pain/congestion  -pseudoephedrine (sudafed) - Nasonex/flonase nasal spray  Fever  -Acetaminophen (Tyelnol) -Ibuprofen (Advil, motrin, aleve)  Sore Throat  -Acetaminophen (Tyelnol) -Ibuprofen (Advil, motrin, aleve) -Drink a lot of water -Gargle with salt water - Rest your voice (don't talk) -Throat sprays -Cough drops  Body Aches  -Acetaminophen (Tyelnol) -Ibuprofen (Advil, motrin, aleve)  Headache  -Acetaminophen (Tyelnol) -Ibuprofen (Advil, motrin, aleve) - Exedrin, Exedrin Migraine  Allergy symptoms (cough,  sneeze, runny nose, itchy eyes) -Claritin or loratadine cheapest but likely the weakest  -Zyrtec or certizine at night because it can make you sleepy -The strongest is allegra or fexafinadine  Cheapest at [facility], [employer], [employer]  Cough  -Dextromethorphan (Delsym)- medicine that has DM in it -Guafenesin (Mucinex/Robitussin) - cough drops - drink lots of water  Chest Congestion  -Guafenesin (Mucinex/Robitussin)  Red Itchy Eyes  - Naphcon-A  Upset Stomach  - Bland diet (nothing spicy, greasy, fried, and high acid foods like tomatoes, oranges, berries) -OKAY- cereal, bread, soup, crackers, rice -Eat smaller more frequent meals -reduce caffeine, no alcohol -Loperamide (Imodium-AD) if diarrhea -Prevacid for heart burn  General health when sick  -Hydration -wash your hands frequently -keep surfaces clean -change pillow cases and sheets often -Get fresh air but do not exercise strenuously -Vitamin D, double up on it - Vitamin C -Zinc

## 2015-08-30 ENCOUNTER — Other Ambulatory Visit: Payer: Self-pay | Admitting: Physician Assistant

## 2015-10-14 ENCOUNTER — Ambulatory Visit (INDEPENDENT_AMBULATORY_CARE_PROVIDER_SITE_OTHER): Payer: BC Managed Care – PPO | Admitting: Physician Assistant

## 2015-10-14 VITALS — BP 128/64 | HR 80 | Temp 97.5°F | Resp 16 | Ht 63.0 in | Wt 244.0 lb

## 2015-10-14 DIAGNOSIS — R6889 Other general symptoms and signs: Secondary | ICD-10-CM

## 2015-10-14 DIAGNOSIS — E559 Vitamin D deficiency, unspecified: Secondary | ICD-10-CM | POA: Diagnosis not present

## 2015-10-14 DIAGNOSIS — F329 Major depressive disorder, single episode, unspecified: Secondary | ICD-10-CM

## 2015-10-14 DIAGNOSIS — F32A Depression, unspecified: Secondary | ICD-10-CM

## 2015-10-14 DIAGNOSIS — I10 Essential (primary) hypertension: Secondary | ICD-10-CM

## 2015-10-14 DIAGNOSIS — Z1159 Encounter for screening for other viral diseases: Secondary | ICD-10-CM | POA: Diagnosis not present

## 2015-10-14 DIAGNOSIS — I8390 Asymptomatic varicose veins of unspecified lower extremity: Secondary | ICD-10-CM

## 2015-10-14 DIAGNOSIS — Z0001 Encounter for general adult medical examination with abnormal findings: Secondary | ICD-10-CM

## 2015-10-14 DIAGNOSIS — R7303 Prediabetes: Secondary | ICD-10-CM | POA: Diagnosis not present

## 2015-10-14 DIAGNOSIS — Z136 Encounter for screening for cardiovascular disorders: Secondary | ICD-10-CM

## 2015-10-14 DIAGNOSIS — E785 Hyperlipidemia, unspecified: Secondary | ICD-10-CM | POA: Diagnosis not present

## 2015-10-14 DIAGNOSIS — Z79899 Other long term (current) drug therapy: Secondary | ICD-10-CM

## 2015-10-14 DIAGNOSIS — I868 Varicose veins of other specified sites: Secondary | ICD-10-CM

## 2015-10-14 DIAGNOSIS — D649 Anemia, unspecified: Secondary | ICD-10-CM

## 2015-10-14 DIAGNOSIS — G43809 Other migraine, not intractable, without status migrainosus: Secondary | ICD-10-CM

## 2015-10-14 DIAGNOSIS — E669 Obesity, unspecified: Secondary | ICD-10-CM

## 2015-10-14 DIAGNOSIS — E0590 Thyrotoxicosis, unspecified without thyrotoxic crisis or storm: Secondary | ICD-10-CM

## 2015-10-14 LAB — HEMOGLOBIN A1C
HEMOGLOBIN A1C: 6.1 % — AB (ref <5.7)
MEAN PLASMA GLUCOSE: 128 mg/dL

## 2015-10-14 LAB — CBC WITH DIFFERENTIAL/PLATELET
Basophils Absolute: 0 cells/uL (ref 0–200)
Basophils Relative: 0 %
EOS PCT: 6 %
Eosinophils Absolute: 306 cells/uL (ref 15–500)
HEMATOCRIT: 39.1 % (ref 35.0–45.0)
HEMOGLOBIN: 13.1 g/dL (ref 11.7–15.5)
LYMPHS ABS: 2142 {cells}/uL (ref 850–3900)
Lymphocytes Relative: 42 %
MCH: 27.8 pg (ref 27.0–33.0)
MCHC: 33.5 g/dL (ref 32.0–36.0)
MCV: 83 fL (ref 80.0–100.0)
MONO ABS: 255 {cells}/uL (ref 200–950)
MPV: 9.8 fL (ref 7.5–12.5)
Monocytes Relative: 5 %
NEUTROS ABS: 2397 {cells}/uL (ref 1500–7800)
NEUTROS PCT: 47 %
Platelets: 303 10*3/uL (ref 140–400)
RBC: 4.71 MIL/uL (ref 3.80–5.10)
RDW: 14.6 % (ref 11.0–15.0)
WBC: 5.1 10*3/uL (ref 3.8–10.8)

## 2015-10-14 LAB — BASIC METABOLIC PANEL WITH GFR
BUN: 12 mg/dL (ref 7–25)
CALCIUM: 9.5 mg/dL (ref 8.6–10.4)
CO2: 28 mmol/L (ref 20–31)
CREATININE: 0.98 mg/dL (ref 0.50–1.05)
Chloride: 106 mmol/L (ref 98–110)
GFR, Est African American: 76 mL/min (ref >=60)
GFR, Est Non African American: 66 mL/min (ref >=60)
GLUCOSE: 96 mg/dL (ref 65–99)
Potassium: 4.5 mmol/L (ref 3.5–5.3)
Sodium: 143 mmol/L (ref 135–146)

## 2015-10-14 LAB — LIPID PANEL
CHOLESTEROL: 211 mg/dL — AB (ref 125–200)
HDL: 63 mg/dL (ref >=46)
LDL Cholesterol: 136 mg/dL — ABNORMAL HIGH (ref <130)
Total CHOL/HDL Ratio: 3.3 Ratio (ref <=5.0)
Triglycerides: 61 mg/dL (ref <150)
VLDL: 12 mg/dL (ref <30)

## 2015-10-14 LAB — HEPATITIS C ANTIBODY: HCV AB: NEGATIVE

## 2015-10-14 LAB — HEPATIC FUNCTION PANEL
ALT: 15 U/L (ref 6–29)
AST: 14 U/L (ref 10–35)
Albumin: 4 g/dL (ref 3.6–5.1)
Alkaline Phosphatase: 68 U/L (ref 33–130)
BILIRUBIN INDIRECT: 0.3 mg/dL (ref 0.2–1.2)
Bilirubin, Direct: 0.1 mg/dL (ref <=0.2)
TOTAL PROTEIN: 6.9 g/dL (ref 6.1–8.1)
Total Bilirubin: 0.4 mg/dL (ref 0.2–1.2)

## 2015-10-14 LAB — IRON AND TIBC
%SAT: 18 % (ref 11–50)
Iron: 56 ug/dL (ref 45–160)
TIBC: 308 ug/dL (ref 250–450)
UIBC: 252 ug/dL (ref 125–400)

## 2015-10-14 LAB — FERRITIN: FERRITIN: 106 ng/mL (ref 10–232)

## 2015-10-14 LAB — HIV ANTIBODY (ROUTINE TESTING W REFLEX): HIV: NONREACTIVE

## 2015-10-14 LAB — TSH: TSH: 0.43 m[IU]/L

## 2015-10-14 LAB — MAGNESIUM: MAGNESIUM: 1.9 mg/dL (ref 1.5–2.5)

## 2015-10-14 NOTE — Progress Notes (Signed)
Complete Physical  Assessment and Plan: 1. Essential hypertension - continue medications, DASH diet, exercise and monitor at home. Call if greater than 130/80.  - CBC with Differential/Platelet - BASIC METABOLIC PANEL WITH GFR - Hepatic function panel - TSH - Urinalysis, Routine w reflex microscopic (not at [facility]) - Microalbumin / creatinine urine ratio - EKG 12-Lead  2. Prediabetes Discussed general issues about diabetes pathophysiology and management., Educational material distributed., Suggested low cholesterol diet., Encouraged aerobic exercise., Discussed foot care., Reminded to get yearly retinal exam. - Hemoglobin A1c - Insulin, fasting  3. Hyperlipidemia -continue medications, check lipids, decrease fatty foods, increase activity.  - Lipid panel  4. Obesity Obesity with co morbidities- long discussion about weight loss, diet, and exercise Stop phentermine for now, discussed vyvanse for binge eating.   5. Vitamin D deficiency - Vit D  25 hydroxy (rtn osteoporosis monitoring)  6. Medication management - Magnesium  7. Depression Negative/remission  8. Other migraine without status migrainosus, not intractable Avoid triggers  9. Routine general medical examination at a health care facility  10. Hyperthyroidism monitor  11. Varicose veins Varicose veins- weight loss discussed, continue compression stockings and elevation  12. Anemia, unspecified anemia type - Ferritin - Iron and TIBC - Vitamin B12  Discussed med's effects and SE's. Screening labs and tests as requested with regular follow-up as recommended.  HPI 55 y.o. female  presents for a complete physical. Her blood pressure has been controlled at home, today their BP is BP: 128/64 mmHg She does not workout, has been trying to walk. She denies chest pain, shortness of breath, dizziness.  She is on cholesterol medication, simvastatin  and denies myalgias. Her cholesterol is at goal. The cholesterol  last visit was:   Lab Results  Component Value Date   CHOL 193 04/29/2015   HDL 54 04/29/2015   LDLCALC 116 04/29/2015   TRIG 113 04/29/2015   CHOLHDL 3.6 04/29/2015   She has been working on diet and exercise for prediabetes, and denies paresthesia of the feet, polydipsia and polyuria. She is on bASA. Last A1C in the office was:  Lab Results  Component Value Date   HGBA1C 6.4* 04/29/2015   Patient is on Vitamin D supplement, 2500.  Lab Results  Component Value Date   VD25OH 28* 04/29/2015   She has very bad allergies but takes medications that help.  Got married sunday She has had hyperthyroidism, she has had normal RAI uptake and negative thyroid BX.  Lab Results  Component Value Date   TSH 0.259* 04/29/2015   She is on minivelle patch for hot flashes/estrogen def.  BMI is Body mass index is 43.23 kg/(m^2)., she is working on diet and exercise. She has had increased stress with her family and her sister was just sent to [place] today. She has been phentermine but it is not working anymore. Wt Readings from Last 3 Encounters:  10/14/15 244 lb (110.678 kg)  07/08/15 241 lb 12.8 oz (109.68 kg)  04/29/15 245 lb (111.131 kg)    Current Medications:  Current Outpatient Prescriptions on File Prior to Visit  Medication Sig Dispense Refill  . ALPRAZolam (XANAX) 0.5 MG tablet Take 1 tablet (0.5 mg total) by mouth 3 (three) times daily as needed for sleep or anxiety. 30 tablet 0  . aspirin 81 MG tablet Take 81 mg by mouth daily.    Marland Kitchen azelastine (ASTELIN) 137 MCG/SPRAY nasal spray Place 2 sprays into both nostrils 2 (two) times daily. Use in each nostril  as directed    . buPROPion (WELLBUTRIN XL) 150 MG 24 hr tablet Take 1 tablet (150 mg total) by mouth every morning. 30 tablet 2  . Calcium-Magnesium-Zinc 167-83-8 MG TABS Take 1 tablet by mouth daily.    . cetirizine (ZYRTEC) 10 MG tablet Take 10 mg by mouth daily.    . cholecalciferol (VITAMIN D) 1000 UNITS tablet Take 5,000  Units by mouth daily.    . hydrochlorothiazide (HYDRODIURIL) 25 MG tablet TAKE 1 TABLET BY MOUTH EVERY DAY 90 tablet 1  . MINIVELLE 0.05 MG/24HR patch Place 1 patch onto the skin 2 (two) times a week. Monday and Fridays    . simvastatin (ZOCOR) 40 MG tablet TAKE 1 TABLET BY MOUTH EVERY DAY 90 tablet 1  . triamcinolone cream (KENALOG) 0.1 % Apply 1 application topically 2 (two) times daily. 80 g 1   No current facility-administered medications on file prior to visit.   Health Maintenance:   Immunization History  Administered Date(s) Administered  . Td 05/07/2007   Tetanus: 2009 Pneumovax: N/A Flu vaccine: N/A Zostavax: N/A Pap: 06/2014 LMP: 2007 MGM: 06/2014 nl, has not had this year DEXA: 2014 normal Colonoscopy: 2010 due 2020 EGD: 01/2012- gastritis Echo: 2011 trace MR CT AB/Pelvis 06/2014 RAI thyroid uptake normal Nodule negative  Patient Care Team: Lucky Cowboy, MD as PCP - General (Internal Medicine) Sharrell Ku, MD as Consulting Physician (Gastroenterology) Teodora Medici, MD as Consulting Physician (Gynecology)  Allergies:  Allergies  Allergen Reactions  . Singulair [Montelukast Sodium] Swelling  . Lactose Intolerance (Gi)    Medical History:  Past Medical History  Diagnosis Date  . Hyperlipidemia   . Allergy   . Depression   . Migraines   . Vitamin D deficiency   . Prediabetes   . Hypertension     Echo 2011 normal EF, trace MR  . Obesity    Surgical History:  Past Surgical History  Procedure Laterality Date  . Abdominal hysterectomy    . Stapedectomy Right 2000   Family History:  Family History  Problem Relation Age of Onset  . Hypertension Mother   . Asthma Mother   . Arthritis Mother   . Diabetes Father   . Hyperlipidemia Father   . Hypertension Father   . Hypertension Sister   . Diabetes Daughter   . Cancer Maternal Grandmother 72    colon  . Cancer Maternal Grandfather 85    colon   Social History:  Social History  Substance  Use Topics  . Smoking status: Never Smoker   . Smokeless tobacco: Never Used  . Alcohol Use: No   Review of Systems  Constitutional: Negative.   HENT: Negative.   Eyes: Negative.   Respiratory: Negative.   Cardiovascular: Negative.   Gastrointestinal: Negative.   Genitourinary: Negative.   Musculoskeletal: Negative.   Skin: Negative.   Neurological: Negative.   Endo/Heme/Allergies: Negative.   Psychiatric/Behavioral: Negative.     Physical Exam: Estimated body mass index is 43.23 kg/(m^2) as calculated from the following:   Height as of this encounter: 5\' 3"  (1.6 m).   Weight as of this encounter: 244 lb (110.678 kg). BP 128/64 mmHg  Pulse 80  Temp(Src) 97.5 F (36.4 C) (Temporal)  Resp 16  Ht 5\' 3"  (1.6 m)  Wt 244 lb (110.678 kg)  BMI 43.23 kg/m2  SpO2 98% Wt Readings from Last 3 Encounters:  10/14/15 244 lb (110.678 kg)  07/08/15 241 lb 12.8 oz (109.68 kg)  04/29/15 245 lb (111.131 kg)  General Appearance: Well nourished, in no apparent distress. Eyes: PERRLA, EOMs, conjunctiva no swelling or erythema, normal fundi and vessels. Sinuses: No Frontal/maxillary tenderness ENT/Mouth: Ext aud canals clear, normal light reflex with TMs without erythema, bulging.  Good dentition. No erythema, swelling, or exudate on post pharynx. Tonsils not swollen or erythematous. Hearing normal.  Neck: Supple, thyroid normal. No bruits Respiratory: Respiratory effort normal, BS equal bilaterally without rales, rhonchi, wheezing or stridor. Cardio: RRR without murmurs, rubs or gallops. Brisk peripheral pulses without edema.  Chest: symmetric, with normal excursions and percussion. Breasts: defer Abdomen: Soft, +BS. Non tender, no guarding, rebound, hernias, masses, or organomegaly. .  Lymphatics: Non tender without lymphadenopathy.  Genitourinary: defer Musculoskeletal: Full ROM all peripheral extremities,5/5 strength, and normal gait. Skin: Warm, dry without rashes, lesions,  ecchymosis. 4x175mm nevus unchanged right medial foot Neuro: Cranial nerves intact, reflexes equal bilaterally. Normal muscle tone, no cerebellar symptoms. Sensation intact.  Psych: Awake and oriented X 3, normal affect, Insight and Judgment appropriate.   EKG: WNL no changes. AORTA SCAN: defer   Quentin Mullingmanda Eligah Anello 10:34 AM

## 2015-10-15 LAB — URINALYSIS, ROUTINE W REFLEX MICROSCOPIC
BILIRUBIN URINE: NEGATIVE
Glucose, UA: NEGATIVE
HGB URINE DIPSTICK: NEGATIVE
KETONES UR: NEGATIVE
Leukocytes, UA: NEGATIVE
NITRITE: NEGATIVE
PH: 5.5 (ref 5.0–8.0)
Protein, ur: NEGATIVE
Specific Gravity, Urine: 1.02 (ref 1.001–1.035)

## 2015-10-15 LAB — MICROALBUMIN / CREATININE URINE RATIO: Creatinine, Urine: 151 mg/dL (ref 20–320)

## 2015-10-15 LAB — VITAMIN D 25 HYDROXY (VIT D DEFICIENCY, FRACTURES): Vit D, 25-Hydroxy: 28 ng/mL — ABNORMAL LOW (ref 30–100)

## 2015-10-15 LAB — RPR

## 2016-01-14 ENCOUNTER — Ambulatory Visit: Payer: Self-pay | Admitting: Physician Assistant

## 2016-01-29 ENCOUNTER — Encounter: Payer: Self-pay | Admitting: Physician Assistant

## 2016-01-29 ENCOUNTER — Ambulatory Visit (INDEPENDENT_AMBULATORY_CARE_PROVIDER_SITE_OTHER): Payer: BC Managed Care – PPO | Admitting: Physician Assistant

## 2016-01-29 VITALS — BP 138/92 | HR 102 | Temp 98.2°F | Resp 18 | Ht 63.0 in | Wt 247.0 lb

## 2016-01-29 DIAGNOSIS — Z79899 Other long term (current) drug therapy: Secondary | ICD-10-CM | POA: Diagnosis not present

## 2016-01-29 DIAGNOSIS — E785 Hyperlipidemia, unspecified: Secondary | ICD-10-CM

## 2016-01-29 DIAGNOSIS — I10 Essential (primary) hypertension: Secondary | ICD-10-CM

## 2016-01-29 DIAGNOSIS — E0590 Thyrotoxicosis, unspecified without thyrotoxic crisis or storm: Secondary | ICD-10-CM

## 2016-01-29 DIAGNOSIS — R7303 Prediabetes: Secondary | ICD-10-CM

## 2016-01-29 DIAGNOSIS — M654 Radial styloid tenosynovitis [de Quervain]: Secondary | ICD-10-CM

## 2016-01-29 LAB — CBC WITH DIFFERENTIAL/PLATELET
BASOS PCT: 0 %
Basophils Absolute: 0 cells/uL (ref 0–200)
Eosinophils Absolute: 308 cells/uL (ref 15–500)
Eosinophils Relative: 4 %
HEMATOCRIT: 39.5 % (ref 35.0–45.0)
Hemoglobin: 13.5 g/dL (ref 11.7–15.5)
LYMPHS PCT: 37 %
Lymphs Abs: 2849 cells/uL (ref 850–3900)
MCH: 28.2 pg (ref 27.0–33.0)
MCHC: 34.2 g/dL (ref 32.0–36.0)
MCV: 82.6 fL (ref 80.0–100.0)
MONO ABS: 462 {cells}/uL (ref 200–950)
MONOS PCT: 6 %
MPV: 9.8 fL (ref 7.5–12.5)
NEUTROS ABS: 4081 {cells}/uL (ref 1500–7800)
Neutrophils Relative %: 53 %
PLATELETS: 311 10*3/uL (ref 140–400)
RBC: 4.78 MIL/uL (ref 3.80–5.10)
RDW: 14.6 % (ref 11.0–15.0)
WBC: 7.7 10*3/uL (ref 3.8–10.8)

## 2016-01-29 LAB — HEPATIC FUNCTION PANEL
ALK PHOS: 78 U/L (ref 33–130)
ALT: 11 U/L (ref 6–29)
AST: 12 U/L (ref 10–35)
Albumin: 4.3 g/dL (ref 3.6–5.1)
BILIRUBIN INDIRECT: 0.3 mg/dL (ref 0.2–1.2)
BILIRUBIN TOTAL: 0.4 mg/dL (ref 0.2–1.2)
Bilirubin, Direct: 0.1 mg/dL (ref <=0.2)
Total Protein: 7.5 g/dL (ref 6.1–8.1)

## 2016-01-29 LAB — BASIC METABOLIC PANEL WITH GFR
BUN: 14 mg/dL (ref 7–25)
CO2: 23 mmol/L (ref 20–31)
Calcium: 9.7 mg/dL (ref 8.6–10.4)
Chloride: 105 mmol/L (ref 98–110)
Creat: 0.95 mg/dL (ref 0.50–1.05)
GFR, EST AFRICAN AMERICAN: 79 mL/min (ref >=60)
GFR, EST NON AFRICAN AMERICAN: 69 mL/min (ref >=60)
Glucose, Bld: 85 mg/dL (ref 65–99)
Potassium: 4.1 mmol/L (ref 3.5–5.3)
Sodium: 141 mmol/L (ref 135–146)

## 2016-01-29 LAB — LIPID PANEL
Cholesterol: 222 mg/dL — ABNORMAL HIGH (ref 125–200)
HDL: 53 mg/dL (ref >=46)
LDL CALC: 147 mg/dL — AB (ref <130)
TRIGLYCERIDES: 109 mg/dL (ref <150)
Total CHOL/HDL Ratio: 4.2 Ratio (ref <=5.0)
VLDL: 22 mg/dL (ref <30)

## 2016-01-29 LAB — MAGNESIUM: Magnesium: 1.7 mg/dL (ref 1.5–2.5)

## 2016-01-29 MED ORDER — PHENTERMINE HCL 37.5 MG PO TABS: 37.5000 mg | ORAL_TABLET | Freq: Every day | ORAL | 2 refills | Status: DC

## 2016-01-29 NOTE — Progress Notes (Signed)
3 month follow up  Assessment and Plan: Essential hypertension - continue medications, DASH diet, exercise and monitor at home. Call if greater than 130/80.  - CBC with Differential/Platelet - BASIC METABOLIC PANEL WITH GFR - Hepatic function panel - TSH - Urinalysis, Routine w reflex microscopic (not at [facility]) - Microalbumin / creatinine urine ratio - EKG 12-Lead  Prediabetes Discussed general issues about diabetes pathophysiology and management., Educational material distributed., Suggested low cholesterol diet., Encouraged aerobic exercise., Discussed foot care., Reminded to get yearly retinal exam. - Hemoglobin A1c - Insulin, fasting  Hyperlipidemia -continue medications, check lipids, decrease fatty foods, increase activity.  - Lipid panel  Obesity Obesity with co morbidities- long discussion about weight loss, diet, and exercise Phentermine written for 3 months, follow up 3 months   Medication management - Magnesium  Hyperthyroidism Monitor  De Quervain's tenosynovitis, right Get splint, aleve, ice, rest    Discussed med's effects and SE's. Screening labs and tests as requested with regular follow-up as recommended.  HPI 55 y.o. female  presents for follow up for HTN, chol, hyperthyroidism, preDM, morbid obesity.  She has history of carpal tunnel, right handed, right hand has  Her blood pressure has been controlled at home, today their BP is BP: (!) 138/92 She does not workout, has been trying to walk. She denies chest pain, shortness of breath, dizziness.  She is on cholesterol medication, simvastatin 40mg  and denies myalgias. Her cholesterol is at goal. The cholesterol last visit was:   Lab Results  Component Value Date   CHOL 211 (H) 10/14/2015   HDL 63 10/14/2015   LDLCALC 136 (H) 10/14/2015   TRIG 61 10/14/2015   CHOLHDL 3.3 10/14/2015   She has been working on diet and exercise for prediabetes, and denies paresthesia of the feet, polydipsia and  polyuria. She is on bASA. Last A1C in the office was:  Lab Results  Component Value Date   HGBA1C 6.1 (H) 10/14/2015   Patient is on Vitamin D supplement, 2000 daily.  Lab Results  Component Value Date   VD25OH 28 (L) 10/14/2015   She has had hyperthyroidism, she has had normal RAI uptake and negative thyroid BX.  Lab Results  Component Value Date   TSH 0.43 10/14/2015   She is on minivelle patch for hot flashes/estrogen def.  BMI is Body mass index is 43.75 kg/m., she is working on diet and exercise. She has had increased stress with her family and her sister was just sent to [place] today. She has been phentermine but it is not working anymore. Wt Readings from Last 3 Encounters:  01/29/16 247 lb (112 kg)  10/14/15 244 lb (110.7 kg)  07/08/15 241 lb 12.8 oz (109.7 kg)    Current Medications:  Current Outpatient Prescriptions on File Prior to Visit  Medication Sig  . ALPRAZolam (XANAX) 0.5 MG tablet Take 1 tablet (0.5 mg total) by mouth 3 (three) times daily as needed for sleep or anxiety.  Marland Kitchen aspirin 81 MG tablet Take 81 mg by mouth daily.  Marland Kitchen azelastine (ASTELIN) 137 MCG/SPRAY nasal spray Place 2 sprays into both nostrils 2 (two) times daily. Use in each nostril as directed  . buPROPion (WELLBUTRIN XL) 150 MG 24 hr tablet Take 1 tablet (150 mg total) by mouth every morning.  . Calcium-Magnesium-Zinc 167-83-8 MG TABS Take 1 tablet by mouth daily.  . cetirizine (ZYRTEC) 10 MG tablet Take 10 mg by mouth daily.  . cholecalciferol (VITAMIN D) 1000 UNITS tablet Take 5,000 Units  by mouth daily.  . hydrochlorothiazide (HYDRODIURIL) 25 MG tablet TAKE 1 TABLET BY MOUTH EVERY DAY  . MINIVELLE 0.05 MG/24HR patch Place 1 patch onto the skin 2 (two) times a week. Monday and Fridays  . simvastatin (ZOCOR) 40 MG tablet TAKE 1 TABLET BY MOUTH EVERY DAY  . triamcinolone cream (KENALOG) 0.1 % Apply 1 application topically 2 (two) times daily.   No current facility-administered medications on  file prior to visit.     Problem list She has Hypertension; Hyperlipidemia; Depression; Migraines; Vitamin D deficiency; Prediabetes; Morbid obesity (HCC); Medication management; Varicose veins; Hyperthyroidism; Thyroid nodule; and Absolute anemia on her problem list.   Review of Systems  Constitutional: Negative.   HENT: Negative.   Eyes: Negative.   Respiratory: Negative.   Cardiovascular: Negative.   Gastrointestinal: Negative.   Genitourinary: Negative.   Musculoskeletal: Negative.   Skin: Negative.   Neurological: Negative.   Endo/Heme/Allergies: Negative.   Psychiatric/Behavioral: Negative.     Physical Exam: Estimated body mass index is 43.75 kg/m as calculated from the following:   Height as of this encounter: 5\' 3"  (1.6 m).   Weight as of this encounter: 247 lb (112 kg). BP (!) 138/92   Pulse (!) 102   Temp 98.2 F (36.8 C) (Temporal)   Resp 18   Ht 5\' 3"  (1.6 m)   Wt 247 lb (112 kg)   BMI 43.75 kg/m  Wt Readings from Last 3 Encounters:  01/29/16 247 lb (112 kg)  10/14/15 244 lb (110.7 kg)  07/08/15 241 lb 12.8 oz (109.7 kg)   General Appearance: Well nourished, in no apparent distress. Eyes: PERRLA, EOMs, conjunctiva no swelling or erythema, normal fundi and vessels. Sinuses: No Frontal/maxillary tenderness ENT/Mouth: Ext aud canals clear, normal light reflex with TMs without erythema, bulging.  Good dentition. No erythema, swelling, or exudate on post pharynx. Tonsils not swollen or erythematous. Hearing normal.  Neck: Supple, thyroid normal. No bruits Respiratory: Respiratory effort normal, BS equal bilaterally without rales, rhonchi, wheezing or stridor. Cardio: RRR without murmurs, rubs or gallops. Brisk peripheral pulses without edema.  Chest: symmetric, with normal excursions and percussion. Abdomen: Soft, +BS. Non tender, no guarding, rebound, hernias, masses, or organomegaly. .  Lymphatics: Non tender without lymphadenopathy.  Musculoskeletal: Full  ROM all peripheral extremities,5/5 strength, and normal gait. + finkelstein on the right.  Skin: Warm, dry without rashes, lesions, ecchymosis. Neuro: Cranial nerves intact, reflexes equal bilaterally. Normal muscle tone, no cerebellar symptoms. Sensation intact.  Psych: Awake and oriented X 3, normal affect, Insight and Judgment appropriate.    Quentin MullingAmanda Dalanie Kisner 4:14 PM

## 2016-01-29 NOTE — Patient Instructions (Addendum)
De Quervain Tenosynovitis Tendons attach muscles to bones. They also help with joint movements. When tendons become irritated or swollen, it is called tendinitis. The extensor pollicis brevis (EPB) tendon connects the EPB muscle to a bone that is near the base of the thumb. The EPB muscle helps to straighten and extend the thumb. De Quervain tenosynovitis is a condition in which the EPB tendon lining (sheath) becomes irritated, thickened, and swollen. This condition is sometimes called stenosing tenosynovitis. This condition causes pain on the thumb side of the back of the wrist. CAUSES Causes of this condition include:  Activities that repeatedly cause your thumb and wrist to extend.  A sudden increase in activity or change in activity that affects your wrist. RISK FACTORS: This condition is more likely to develop in:  Females.  People who have diabetes.  Women who have recently given birth.  People who are over 79 years of age.  People who do activities that involve repeated hand and wrist motions, such as tennis, racquetball, volleyball, gardening, and taking care of children.  People who do heavy labor.  People who have poor wrist strength and flexibility.  People who do not warm up properly before activities. SYMPTOMS Symptoms of this condition include:  Pain or tenderness over the thumb side of the back of the wrist when your thumb and wrist are not moving.  Pain that gets worse when you straighten your thumb or extend your thumb or wrist.  Pain when the injured area is touched.  Locking or catching of the thumb joint while you bend and straighten your thumb.  Decreased thumb motion due to pain.  Swelling over the affected area. DIAGNOSIS This condition is diagnosed with a medical history and physical exam. Your health care provider will ask for details about your injury and ask about your symptoms. TREATMENT Treatment may include the use of icing and medicines to  reduce pain and swelling. You may also be advised to wear a splint or brace to limit your thumb and wrist motion. In less severe cases, treatment may also include working with a physical therapist to strengthen your wrist and calm the irritation around your EPB tendon sheath. In severe cases, surgery may be needed. HOME CARE INSTRUCTIONS If You Have a Splint or Brace: GET THUMB SPICAA SPLINT  Wear it as told by your health care provider. Remove it only as told by your health care provider.  Loosen the splint or brace if your fingers become numb and tingle, or if they turn cold and blue.  Keep the splint or brace clean and dry. Managing Pain, Stiffness, and Swelling   If directed, apply ice to the injured area.  Put ice in a plastic bag.  Place a towel between your skin and the bag.  Leave the ice on for 20 minutes, 2-3 times per day.  Move your fingers often to avoid stiffness and to lessen swelling.  Raise (elevate) the injured area above the level of your heart while you are sitting or lying down. General Instructions  Return to your normal activities as told by your health care provider. Ask your health care provider what activities are safe for you.  Take over-the-counter and prescription medicines only as told by your health care provider.  Keep all follow-up visits as told by your health care provider. This is important.  Do not drive or operate heavy machinery while taking prescription pain medicine. SEEK MEDICAL CARE IF:  Your pain, tenderness, or swelling gets worse, even  if you have had treatment.  You have numbness or tingling in your wrist, hand, or fingers on the injured side.   This information is not intended to replace advice given to you by your health care provider. Make sure you discuss any questions you have with your health care provider.   Document Released: 04/19/2005 Document Revised: 01/08/2015 Document Reviewed: 06/25/2014 Elsevier Interactive  Patient Education 2016 [employer].  Phentermine  While taking the medication we may ask that you come into the office once a month or once every 2-3 months to monitor your weight, blood pressure, and heart rate. In addition we can help answer your questions about diet, exercise, and help you every step of the way with your weight loss journey. Sometime it is helpful if you bring in a food diary or use an app on your phone such as myfitnesspal to record your calorie intake, especially in the beginning.   You can start out on 1/3 to 1/2 a pill in the morning and if you are tolerating it well you can increase to one pill daily. I also have some patients that take 1/3 or 1/2 at lunch to help prevent night time eating.  This medication is cheapest CASH pay at [facility] OR [facility] OR [facility] is 14-17 dollars and you do NOT need a membership to get meds from there.    What is this medicine? PHENTERMINE (FEN ter meen) decreases your appetite. This medicine is intended to be used in addition to a healthy reduced calorie diet and exercise. The best results are achieved this way. This medicine is only indicated for short-term use. Eventually your weight loss may level out and the medication will no longer be needed.   How should I use this medicine? Take this medicine by mouth. Follow the directions on the prescription label. The tablets should stay in the bottle until immediately before you take your dose. Take your doses at regular intervals. Do not take your medicine more often than directed.  Overdosage: If you think you have taken too much of this medicine contact a poison control center or emergency room at once. NOTE: This medicine is only for you. Do not share this medicine with others.  What if I miss a dose? If you miss a dose, take it as soon as you can. If it is almost time for your next dose, take only that dose. Do not take double or extra doses. Do not increase or in any way change your  dose without consulting your doctor.  What should I watch for while using this medicine? Notify your physician immediately if you become short of breath while doing your normal activities. Do not take this medicine within 6 hours of bedtime. It can keep you from getting to sleep. Avoid drinks that contain caffeine and try to stick to a regular bedtime every night. Do not stand or sit up quickly, especially if you are an older patient. This reduces the risk of dizzy or fainting spells. Avoid alcoholic drinks.  What side effects may I notice from receiving this medicine? Side effects that you should report to your doctor or health care professional as soon as possible: -chest pain, palpitations -depression or severe changes in mood -increased blood pressure -irritability -nervousness or restlessness -severe dizziness -shortness of breath -problems urinating -unusual swelling of the legs -vomiting  Side effects that usually do not require medical attention (report to your doctor or health care professional if they continue or are bothersome): -  blurred vision or other eye problems -changes in sexual ability or desire -constipation or diarrhea -difficulty sleeping -dry mouth or unpleasant taste -headache -nausea This list may not describe all possible side effects. Call your doctor for medical advice about side effects. You may report side effects to FDA at 1-800-FDA-1088.    Vitamin D goal is between 60-80  Please make sure that you are taking your Vitamin D as directed.   It is very important as a natural anti-inflammatory   helping hair, skin, and nails, as well as reducing stroke and heart attack risk.   It helps your bones and helps with mood.  It also decreases numerous cancer risks so please take it as directed.   Low Vit D is associated with a 200-300% higher risk for CANCER   and 200-300% higher risk for HEART   ATTACK  &  STROKE.     .....................................Marland Kitchen.  It is also associated with higher death rate at younger ages,   autoimmune diseases like Rheumatoid arthritis, Lupus, Multiple Sclerosis.     Also many other serious conditions, like depression, Alzheimer's  Dementia, infertility, muscle aches, fatigue, fibromyalgia - just to name a few.  +++++++++++++++++++  Can get liquid vitamin D from [employer]  OR [place] at  [facility] [address], [place], [place] [id number]

## 2016-01-30 LAB — TSH: TSH: 0.26 mIU/L — ABNORMAL LOW

## 2016-01-30 LAB — HEMOGLOBIN A1C
Hgb A1c MFr Bld: 6.1 % — ABNORMAL HIGH (ref <5.7)
Mean Plasma Glucose: 128 mg/dL

## 2016-03-19 IMAGING — US US SOFT TISSUE HEAD/NECK
1 series · 13 of 25 positions shown · non-contrast
Comparison: 05/09/2015

CLINICAL DATA: Hyperthyroidism, right thyroid cold nodule by
nuclear medicine 05/09/2015

EXAM:
THYROID ULTRASOUND
TECHNIQUE: Ultrasound examination of the thyroid gland and adjacent soft
tissues was performed.

[Series 1: us soft tissue head/neck · 0.08mm/px · 13 of 69 slices shown]
[im 1/69]
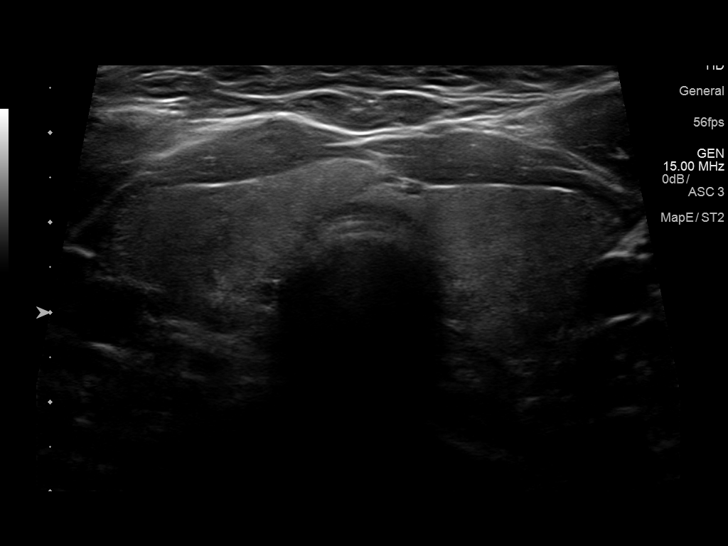
[im 6/69]
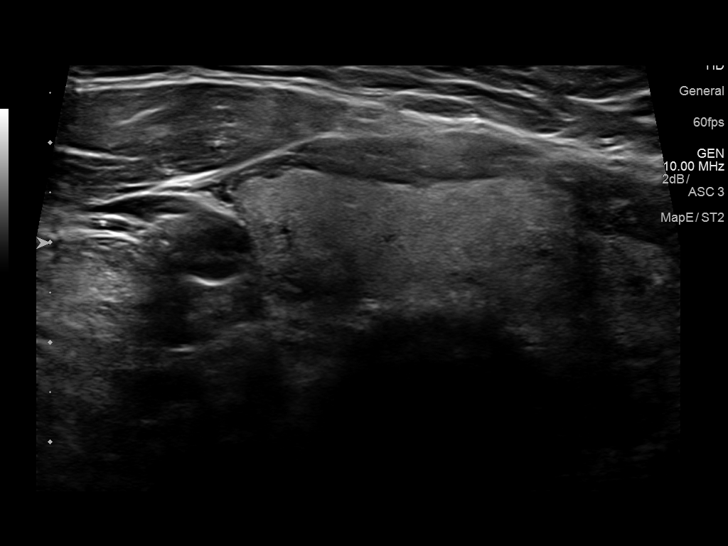
[im 12/69]
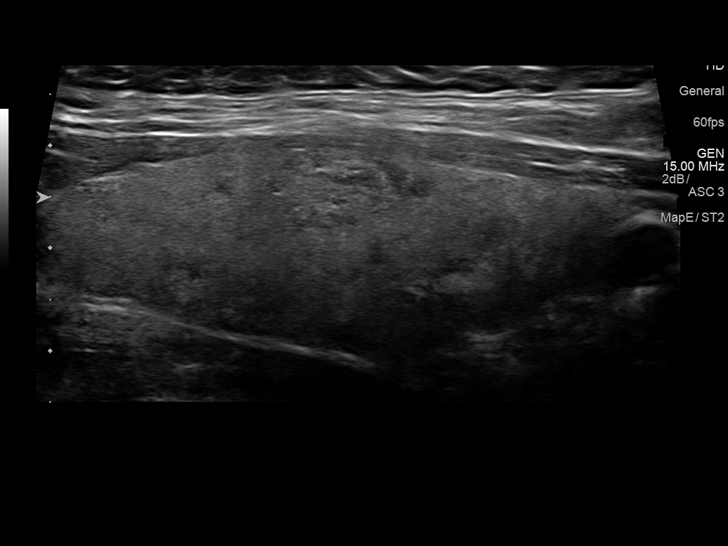
[im 18/69]
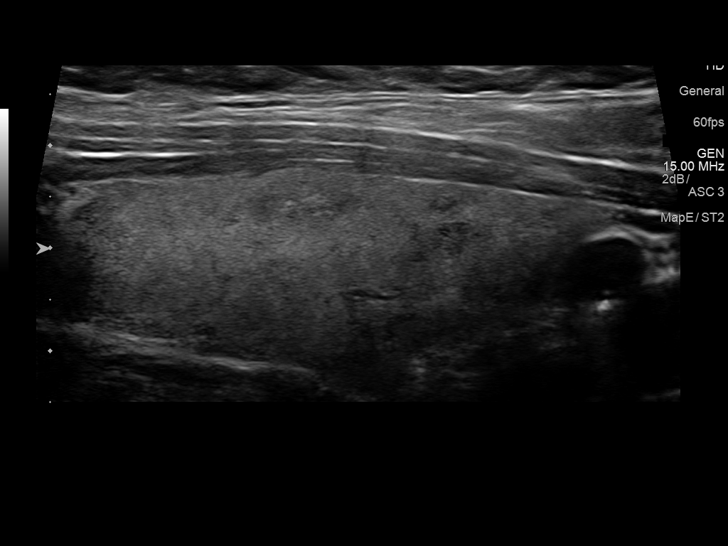
[im 23/69]
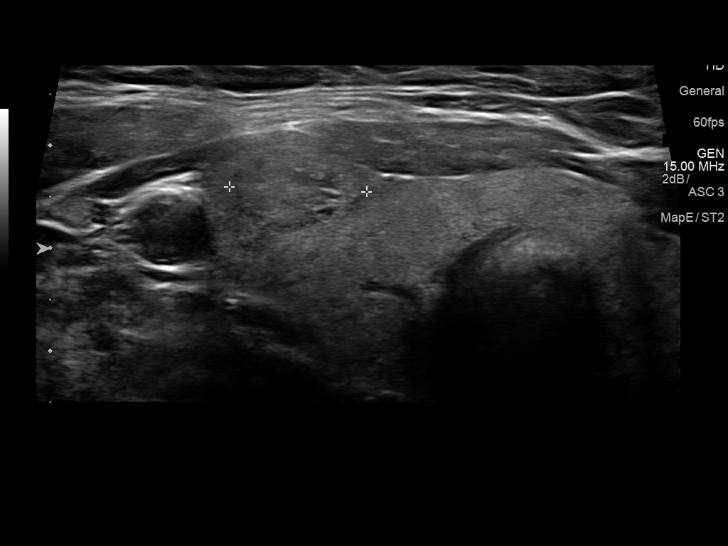
[im 29/69]
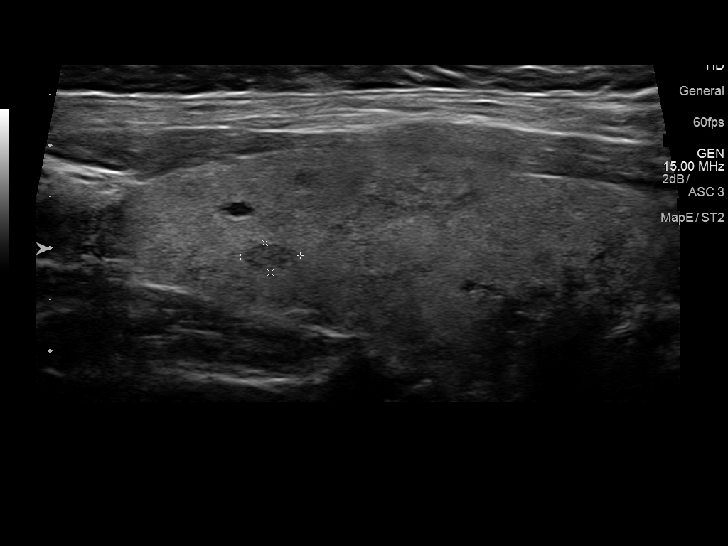
[im 35/69]
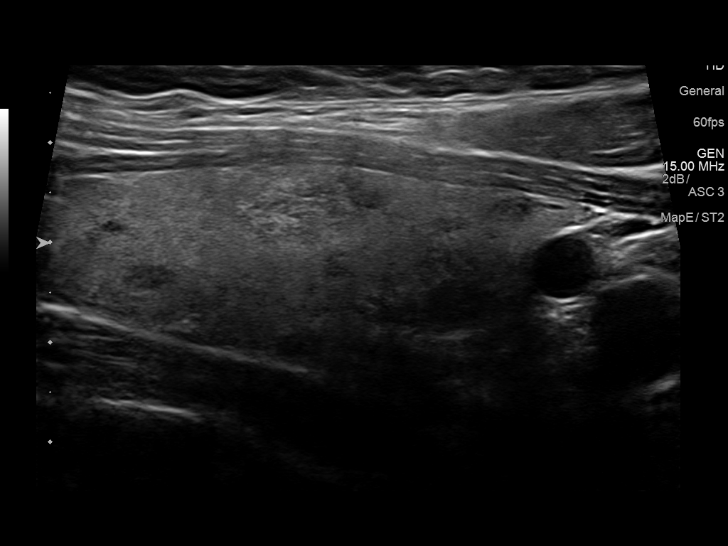
[im 40/69]
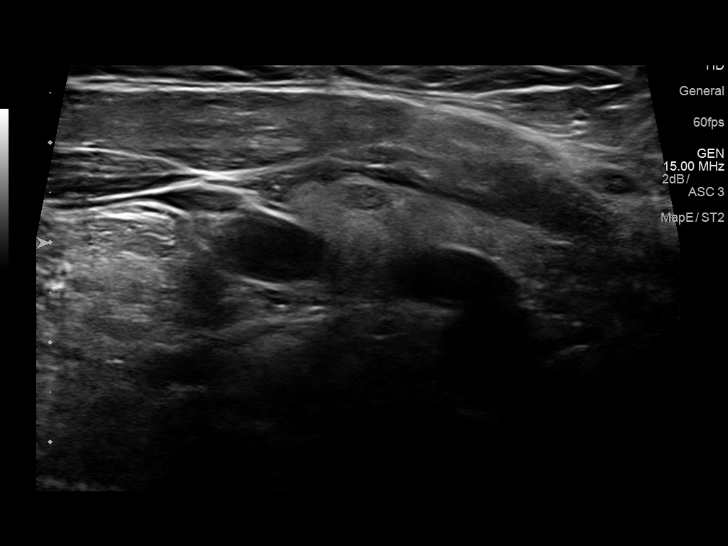
[im 46/69]
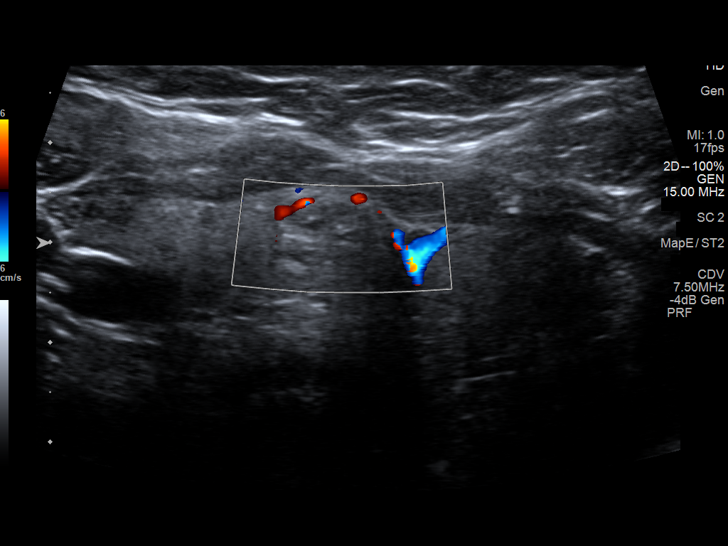
[im 52/69]
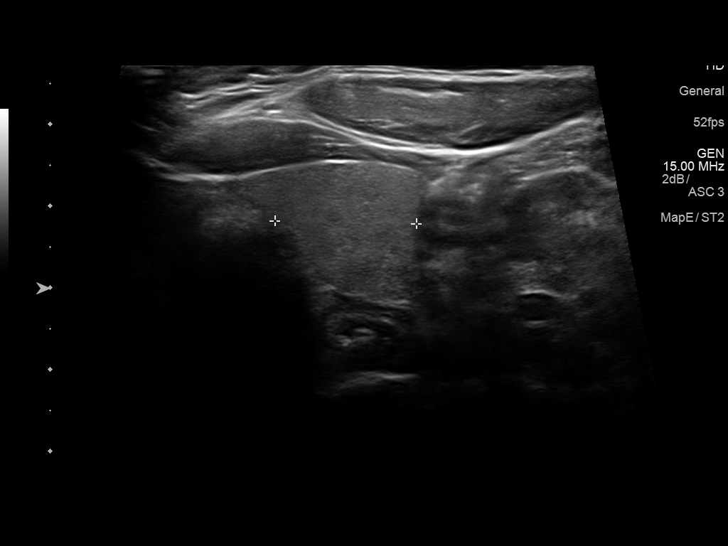
[im 57/69]
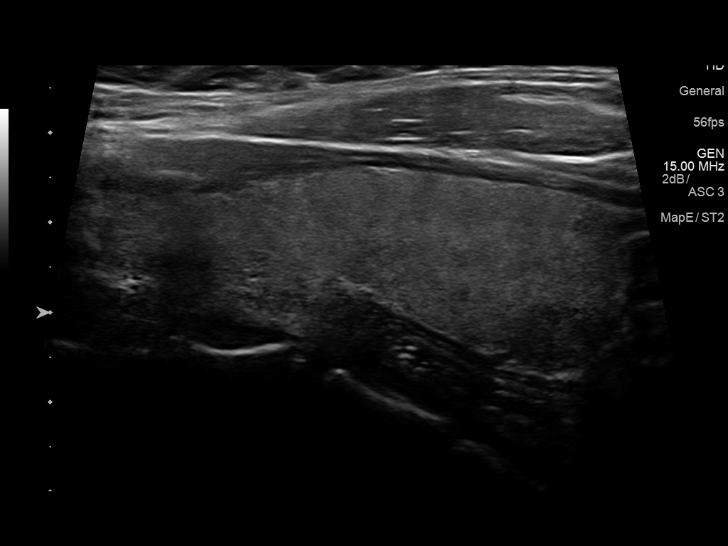
[im 63/69]
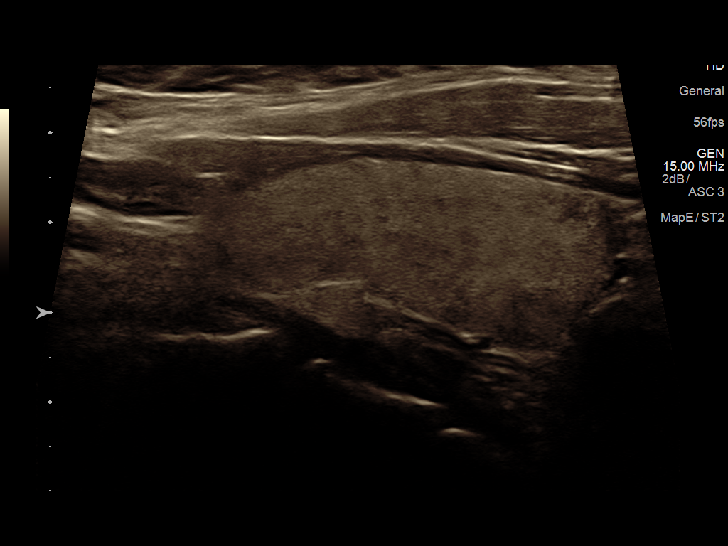
[im 69/69]
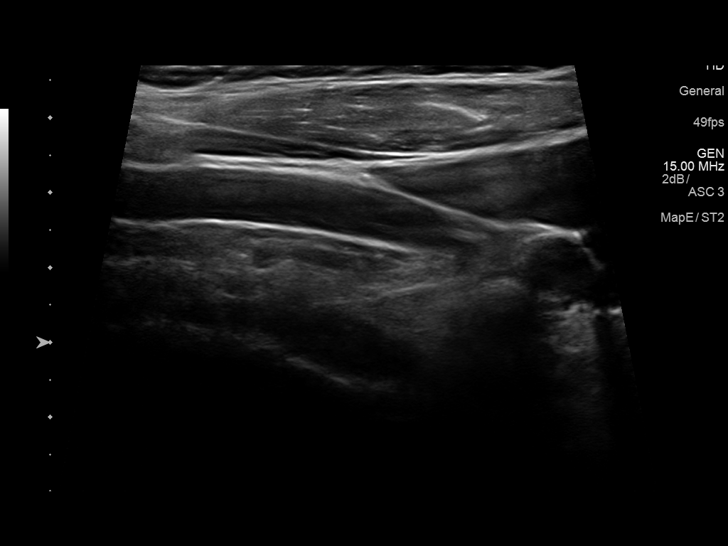

[13 of 25 positions shown; findings below may reference images not displayed]

FINDINGS: Right thyroid lobe

Measurements: 5.6 x 2.0 x 2.1 cm. Heterogeneous background
echotexture. Dominant right mid pole echogenic solid nodule measures
14 x 8 x 13 mm. This appears to correlate with the right midpole
cold nodule by nuclear medicine scan.

Additional scattered hypoechoic nodules present in the right upper
and lower poles all measuring 6 mm or less in size.

Left thyroid lobe

Measurements: 5.4 x 1.8 x 1.7 cm. Small ill-defined hypoechoic solid
nodule in the left lower pole posteriorly measures 12 x 8 x 11 mm.

Isthmus

Thickness: 1.3 cm. Midline echogenic solid isthmus nodule measures
13 x 8 x 11 mm.

Lymphadenopathy

None visualized.
IMPRESSION: Dominant right midpole 1.4 cm solid nodule appears to correlate with
the cold nodule by nuclear medicine scan.

Findings meet consensus criteria for biopsy. Ultrasound-guided fine
needle aspiration should be considered, as per the consensus
statement: Management of Thyroid Nodules Detected at US: Society of
Radiologists in Ultrasound Consensus Conference Statement. Radiology

## 2016-04-29 ENCOUNTER — Ambulatory Visit (INDEPENDENT_AMBULATORY_CARE_PROVIDER_SITE_OTHER): Payer: BC Managed Care – PPO | Admitting: Physician Assistant

## 2016-04-29 ENCOUNTER — Encounter: Payer: Self-pay | Admitting: Physician Assistant

## 2016-04-29 VITALS — BP 134/66 | HR 68 | Temp 97.7°F | Resp 16 | Ht 63.0 in | Wt 252.0 lb

## 2016-04-29 DIAGNOSIS — R7303 Prediabetes: Secondary | ICD-10-CM | POA: Diagnosis not present

## 2016-04-29 DIAGNOSIS — I10 Essential (primary) hypertension: Secondary | ICD-10-CM | POA: Diagnosis not present

## 2016-04-29 DIAGNOSIS — Z79899 Other long term (current) drug therapy: Secondary | ICD-10-CM

## 2016-04-29 DIAGNOSIS — E559 Vitamin D deficiency, unspecified: Secondary | ICD-10-CM

## 2016-04-29 DIAGNOSIS — E785 Hyperlipidemia, unspecified: Secondary | ICD-10-CM

## 2016-04-29 DIAGNOSIS — E0590 Thyrotoxicosis, unspecified without thyrotoxic crisis or storm: Secondary | ICD-10-CM

## 2016-04-29 LAB — CBC WITH DIFFERENTIAL/PLATELET
BASOS PCT: 0 %
Basophils Absolute: 0 cells/uL (ref 0–200)
EOS ABS: 402 {cells}/uL (ref 15–500)
Eosinophils Relative: 6 %
HCT: 39.9 % (ref 35.0–45.0)
Hemoglobin: 13.4 g/dL (ref 11.7–15.5)
LYMPHS PCT: 34 %
Lymphs Abs: 2278 cells/uL (ref 850–3900)
MCH: 28 pg (ref 27.0–33.0)
MCHC: 33.6 g/dL (ref 32.0–36.0)
MCV: 83.3 fL (ref 80.0–100.0)
MONOS PCT: 7 %
MPV: 10 fL (ref 7.5–12.5)
Monocytes Absolute: 469 cells/uL (ref 200–950)
Neutro Abs: 3551 cells/uL (ref 1500–7800)
Neutrophils Relative %: 53 %
PLATELETS: 315 10*3/uL (ref 140–400)
RBC: 4.79 MIL/uL (ref 3.80–5.10)
RDW: 14.6 % (ref 11.0–15.0)
WBC: 6.7 10*3/uL (ref 3.8–10.8)

## 2016-04-29 MED ORDER — PHENTERMINE HCL 37.5 MG PO TABS: 37.5000 mg | ORAL_TABLET | Freq: Every day | ORAL | 2 refills | Status: DC

## 2016-04-29 NOTE — Progress Notes (Signed)
3 month follow up  Assessment and Plan: Essential hypertension - continue medications, DASH diet, exercise and monitor at home. Call if greater than 130/80.  - CBC with Differential/Platelet - BASIC METABOLIC PANEL WITH GFR - Hepatic function panel - TSH - Urinalysis, Routine w reflex microscopic ([facility]) - Microalbumin / creatinine urine ratio - EKG 12-Lead  Prediabetes Discussed general issues about diabetes pathophysiology and management., Educational material distributed., Suggested low cholesterol diet., Encouraged aerobic exercise., Discussed foot care., Reminded to get yearly retinal exam. - Hemoglobin A1c - Insulin, fasting  Hyperlipidemia -continue medications, check lipids, decrease fatty foods, increase activity.  - Lipid panel  Obesity Obesity with co morbidities- long discussion about weight loss, diet, and exercise Phentermine written for 3 months, follow up 3 months   Medication management - Magnesium  Hyperthyroidism Monitor    Discussed med's effects and SE's. Screening labs and tests as requested with regular follow-up as recommended.  HPI 55 y.o. female  presents for follow up for HTN, chol, hyperthyroidism, preDM, morbid obesity.  She has history of carpal tunnel, right handed, right hand has  Her blood pressure has been controlled at home, today their BP is BP: 134/66 She does not workout, has been trying to walk. She denies chest pain, shortness of breath, dizziness.  She is on cholesterol medication, simvastatin 40mg  and denies myalgias. Her cholesterol is at goal. The cholesterol last visit was:   Lab Results  Component Value Date   CHOL 222 (H) 01/29/2016   HDL 53 01/29/2016   LDLCALC 147 (H) 01/29/2016   TRIG 109 01/29/2016   CHOLHDL 4.2 01/29/2016   She has been working on diet and exercise for prediabetes, and denies paresthesia of the feet, polydipsia and polyuria. She is on bASA. Last A1C in the office was:  Lab Results  Component  Value Date   HGBA1C 6.1 (H) 01/29/2016   Patient is on Vitamin D supplement, 5000 daily.  Lab Results  Component Value Date   VD25OH 28 (L) 10/14/2015   She has had hyperthyroidism, she has had normal RAI uptake and negative thyroid BX.  Lab Results  Component Value Date   TSH 0.26 (L) 01/29/2016   She is on minivelle patch for hot flashes/estrogen def, she is weaning off of it.   BMI is Body mass index is 44.64 kg/m., she is working on diet and exercise.  Wt Readings from Last 3 Encounters:  04/29/16 252 lb (114.3 kg)  01/29/16 247 lb (112 kg)  10/14/15 244 lb (110.7 kg)    Current Medications:  Current Outpatient Prescriptions on File Prior to Visit  Medication Sig  . ALPRAZolam (XANAX) 0.5 MG tablet Take 1 tablet (0.5 mg total) by mouth 3 (three) times daily as needed for sleep or anxiety.  Marland Kitchen. aspirin 81 MG tablet Take 81 mg by mouth daily.  Marland Kitchen. azelastine (ASTELIN) 137 MCG/SPRAY nasal spray Place 2 sprays into both nostrils 2 (two) times daily. Use in each nostril as directed  . Calcium-Magnesium-Zinc 167-83-8 MG TABS Take 1 tablet by mouth daily.  . cetirizine (ZYRTEC) 10 MG tablet Take 10 mg by mouth daily.  . cholecalciferol (VITAMIN D) 1000 UNITS tablet Take 5,000 Units by mouth daily.  . hydrochlorothiazide (HYDRODIURIL) 25 MG tablet TAKE 1 TABLET BY MOUTH EVERY DAY  . MINIVELLE 0.05 MG/24HR patch Place 1 patch onto the skin 2 (two) times a week. Monday and Fridays  . simvastatin (ZOCOR) 40 MG tablet TAKE 1 TABLET BY MOUTH EVERY DAY  .  triamcinolone cream (KENALOG) 0.1 % Apply 1 application topically 2 (two) times daily.   No current facility-administered medications on file prior to visit.     Problem list She has Hypertension; Hyperlipidemia; Depression; Migraines; Vitamin D deficiency; Prediabetes; Morbid obesity (HCC); Medication management; Varicose veins; Hyperthyroidism; Thyroid nodule; and Absolute anemia on her problem list.   Review of Systems   Constitutional: Negative.   HENT: Negative.   Eyes: Negative.   Respiratory: Negative.   Cardiovascular: Negative.   Gastrointestinal: Negative.   Genitourinary: Negative.   Musculoskeletal: Negative.   Skin: Negative.   Neurological: Negative.   Endo/Heme/Allergies: Negative.   Psychiatric/Behavioral: Negative.     Physical Exam: Estimated body mass index is 44.64 kg/m as calculated from the following:   Height as of this encounter: 5\' 3"  (1.6 m).   Weight as of this encounter: 252 lb (114.3 kg). BP 134/66   Pulse 68   Temp 97.7 F (36.5 C)   Resp 16   Ht 5\' 3"  (1.6 m)   Wt 252 lb (114.3 kg)   SpO2 97%   BMI 44.64 kg/m  Wt Readings from Last 3 Encounters:  04/29/16 252 lb (114.3 kg)  01/29/16 247 lb (112 kg)  10/14/15 244 lb (110.7 kg)   General Appearance: Well nourished, in no apparent distress. Eyes: PERRLA, EOMs, conjunctiva no swelling or erythema, normal fundi and vessels. Sinuses: No Frontal/maxillary tenderness ENT/Mouth: Ext aud canals clear, normal light reflex with TMs without erythema, bulging.  Good dentition. No erythema, swelling, or exudate on post pharynx. Tonsils not swollen or erythematous. Hearing normal.  Neck: Supple, thyroid normal. No bruits Respiratory: Respiratory effort normal, BS equal bilaterally without rales, rhonchi, wheezing or stridor. Cardio: RRR with systolic murmur RSB without rubs or gallops. Brisk peripheral pulses without edema.  Chest: symmetric, with normal excursions and percussion. Abdomen: Soft, +BS. Non tender, no guarding, rebound, hernias, masses, or organomegaly. .  Lymphatics: Non tender without lymphadenopathy.  Musculoskeletal: Full ROM all peripheral extremities,5/5 strength, and normal gait. + finkelstein on the right.  Skin: Warm, dry without rashes, lesions, ecchymosis. Neuro: Cranial nerves intact, reflexes equal bilaterally. Normal muscle tone, no cerebellar symptoms. Sensation intact.  Psych: Awake and  oriented X 3, normal affect, Insight and Judgment appropriate.    Quentin MullingAmanda Candido Flott 4:43 PM

## 2016-04-29 NOTE — Patient Instructions (Signed)
Simple math prevails.    1st - exercise does not produce significant weight loss - at best one converts fat into muscle , "bulks up", loses inches, but usually stays "weight neutral"     2nd - think of your body weightas a check book: If you eat more calories than you burn up - you save money or gain weight .... Or if you spend more money than you put in the check book, ie burn up more calories than you eat, then you lose weight     3rd - if you walk or run 1 mile, you burn up 100 calories - you have to burn up 3,500 calories to lose 1 pound, ie you have to walk/run 35 miles to lose 1 measly pound. So if you want to lose 10 #, then you have to walk/run 350 miles, so.... clearly exercise is not the solution.     4. So if you consume 1,500 calories, then you have to burn up the equivalent of 15 miles to stay weight neutral - It also stands to reason that if you consume 1,500 cal/day and don't lose weight, then you must be burning up about 1,500 cals/day to stay weight neutral.     5. If you really want to lose weight, you must cut your calorie intake 300 calories /day and at that rate you should lose about 1 # every 3 days.   6. Please purchase Dr Joel Fuhrman's book(s) "The End of Dieting" & "Eat to Live" . It has some great concepts and recipes.      Phentermine  While taking the medication we may ask that you come into the office once a month or once every 2-3 months to monitor your weight, blood pressure, and heart rate. In addition we can help answer your questions about diet, exercise, and help you every step of the way with your weight loss journey. Sometime it is helpful if you bring in a food diary or use an app on your phone such as myfitnesspal to record your calorie intake, especially in the beginning.   You can start out on 1/3 to 1/2 a pill in the morning and if you are tolerating it well you can increase to one pill daily. I also have some patients that take 1/3 or 1/2 at lunch to  help prevent night time eating.  This medication is cheapest CASH pay at [facility] OR [facility] OR [facility] is 14-17 dollars and you do NOT need a membership to get meds from there.    What is this medicine? PHENTERMINE (FEN ter meen) decreases your appetite. This medicine is intended to be used in addition to a healthy reduced calorie diet and exercise. The best results are achieved this way. This medicine is only indicated for short-term use. Eventually your weight loss may level out and the medication will no longer be needed.   How should I use this medicine? Take this medicine by mouth. Follow the directions on the prescription label. The tablets should stay in the bottle until immediately before you take your dose. Take your doses at regular intervals. Do not take your medicine more often than directed.  Overdosage: If you think you have taken too much of this medicine contact a poison control center or emergency room at once. NOTE: This medicine is only for you. Do not share this medicine with others.  What if I miss a dose? If you miss a dose, take it as soon as you   can. If it is almost time for your next dose, take only that dose. Do not take double or extra doses. Do not increase or in any way change your dose without consulting your doctor.  What should I watch for while using this medicine? Notify your physician immediately if you become short of breath while doing your normal activities. Do not take this medicine within 6 hours of bedtime. It can keep you from getting to sleep. Avoid drinks that contain caffeine and try to stick to a regular bedtime every night. Do not stand or sit up quickly, especially if you are an older patient. This reduces the risk of dizzy or fainting spells. Avoid alcoholic drinks.  What side effects may I notice from receiving this medicine? Side effects that you should report to your doctor or health care professional as soon as possible: -chest pain,  palpitations -depression or severe changes in mood -increased blood pressure -irritability -nervousness or restlessness -severe dizziness -shortness of breath -problems urinating -unusual swelling of the legs -vomiting  Side effects that usually do not require medical attention (report to your doctor or health care professional if they continue or are bothersome): -blurred vision or other eye problems -changes in sexual ability or desire -constipation or diarrhea -difficulty sleeping -dry mouth or unpleasant taste -headache -nausea This list may not describe all possible side effects. Call your doctor for medical advice about side effects. You may report side effects to FDA at 1-800-FDA-1088.

## 2016-04-30 LAB — BASIC METABOLIC PANEL WITH GFR
BUN: 15 mg/dL (ref 7–25)
CO2: 21 mmol/L (ref 20–31)
Calcium: 9.2 mg/dL (ref 8.6–10.4)
Chloride: 106 mmol/L (ref 98–110)
Creat: 0.94 mg/dL (ref 0.50–1.05)
GFR, EST AFRICAN AMERICAN: 80 mL/min (ref >=60)
GFR, Est Non African American: 69 mL/min (ref >=60)
Glucose, Bld: 98 mg/dL (ref 65–99)
POTASSIUM: 4.1 mmol/L (ref 3.5–5.3)
Sodium: 140 mmol/L (ref 135–146)

## 2016-04-30 LAB — LIPID PANEL
CHOLESTEROL: 225 mg/dL — AB (ref <200)
HDL: 53 mg/dL (ref >50)
LDL CALC: 147 mg/dL — AB (ref <100)
TRIGLYCERIDES: 127 mg/dL (ref <150)
Total CHOL/HDL Ratio: 4.2 Ratio (ref <5.0)
VLDL: 25 mg/dL (ref <30)

## 2016-04-30 LAB — HEPATIC FUNCTION PANEL
ALBUMIN: 4.2 g/dL (ref 3.6–5.1)
ALK PHOS: 69 U/L (ref 33–130)
ALT: 13 U/L (ref 6–29)
AST: 12 U/L (ref 10–35)
BILIRUBIN TOTAL: 0.3 mg/dL (ref 0.2–1.2)
Bilirubin, Direct: 0 mg/dL (ref <=0.2)
Indirect Bilirubin: 0.3 mg/dL (ref 0.2–1.2)
Total Protein: 7.2 g/dL (ref 6.1–8.1)

## 2016-04-30 LAB — VITAMIN D 25 HYDROXY (VIT D DEFICIENCY, FRACTURES): VIT D 25 HYDROXY: 28 ng/mL — AB (ref 30–100)

## 2016-04-30 LAB — TSH: TSH: 0.32 m[IU]/L — AB

## 2016-04-30 LAB — HEMOGLOBIN A1C
Hgb A1c MFr Bld: 6 % — ABNORMAL HIGH (ref <5.7)
Mean Plasma Glucose: 126 mg/dL

## 2016-04-30 LAB — MAGNESIUM: Magnesium: 1.8 mg/dL (ref 1.5–2.5)

## 2016-07-29 ENCOUNTER — Ambulatory Visit: Payer: Self-pay | Admitting: Physician Assistant

## 2016-09-01 ENCOUNTER — Other Ambulatory Visit: Payer: Self-pay | Admitting: Physician Assistant

## 2016-09-01 DIAGNOSIS — I10 Essential (primary) hypertension: Secondary | ICD-10-CM

## 2016-09-01 DIAGNOSIS — E785 Hyperlipidemia, unspecified: Secondary | ICD-10-CM

## 2016-10-14 ENCOUNTER — Encounter: Payer: Self-pay | Admitting: Physician Assistant

## 2016-10-20 IMAGING — US US THYROID BIOPSY
1 series · 11 of 11 positions shown · non-contrast
Comparison: none

INDICATION: Patient with history of hyperthyroidism and right thyroid cold
nodule by nuclear medicine scan on 05/09/2015. Follow-up thyroid
ultrasound on 05/16/2015 revealed a dominant right midpole solid
nodule which appeared to correlate with the cold nodule seen on
nuclear medicine scan. Request now received for needle aspirate
biopsy of this dominant right thyroid nodule.

[Series 1: us thyroid biopsy · 0.07mm/px · 11 acquisitions, 11 frames shown]
[im 1/11]
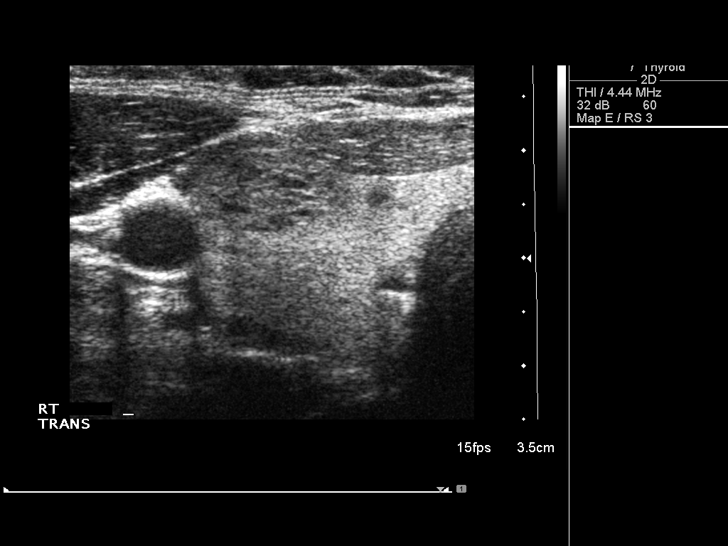
[im 2/11]
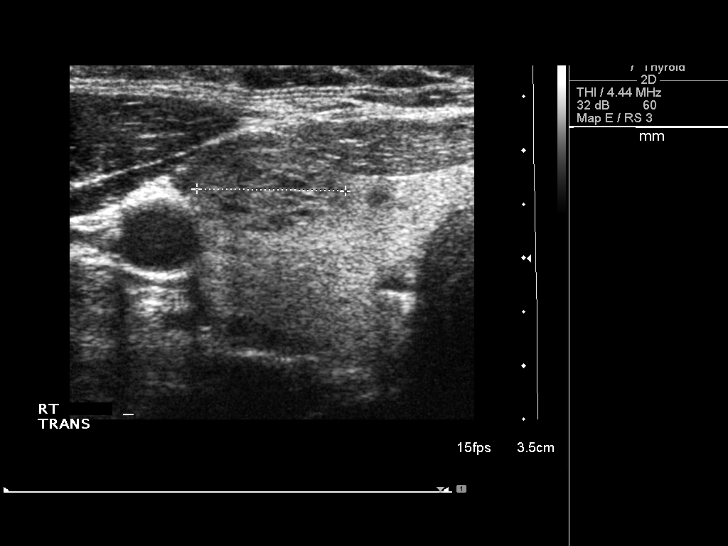
[im 3/11]
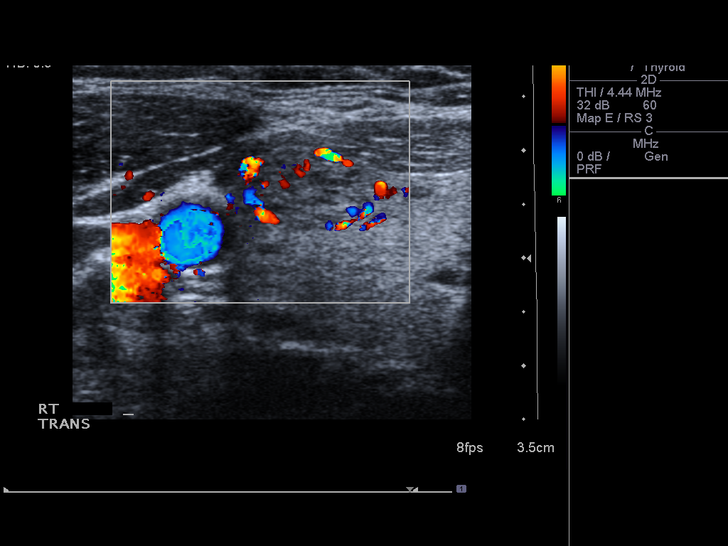
[im 4/11]
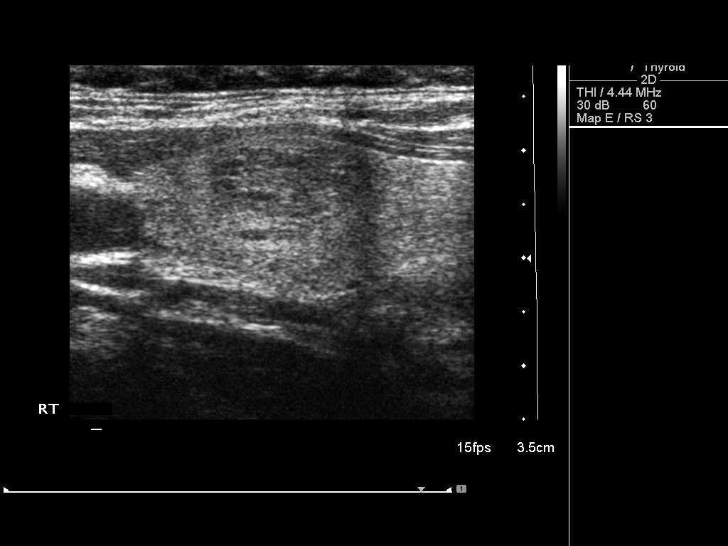
[im 5/11]
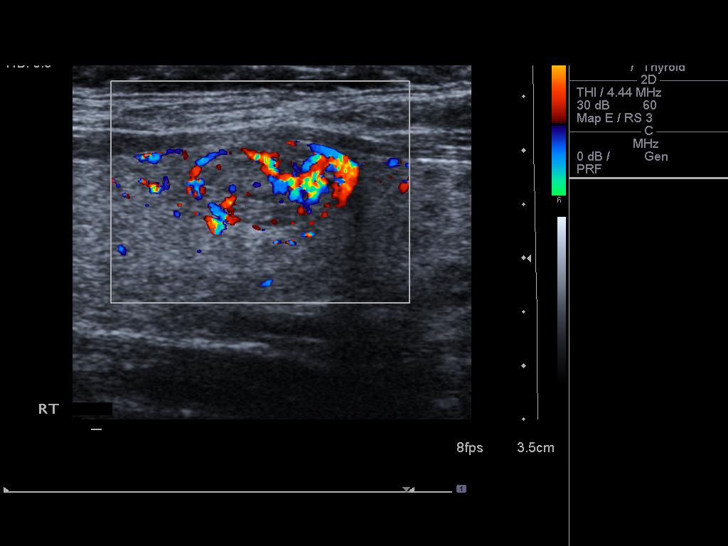
[im 6/11]
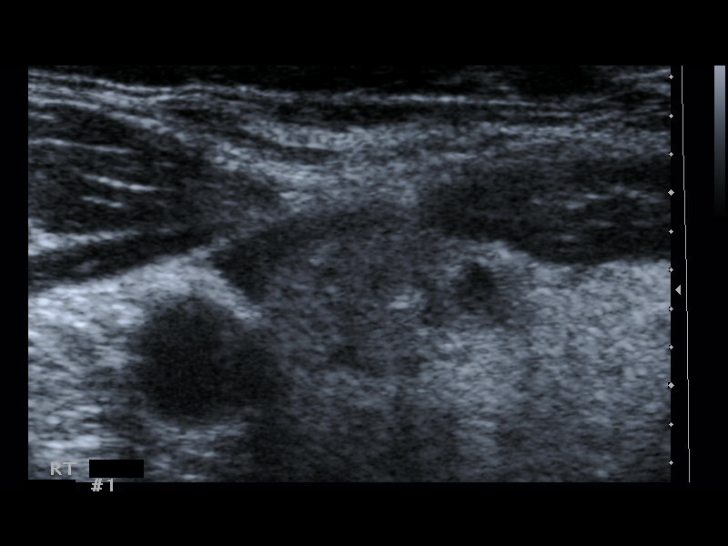
[im 7/11]
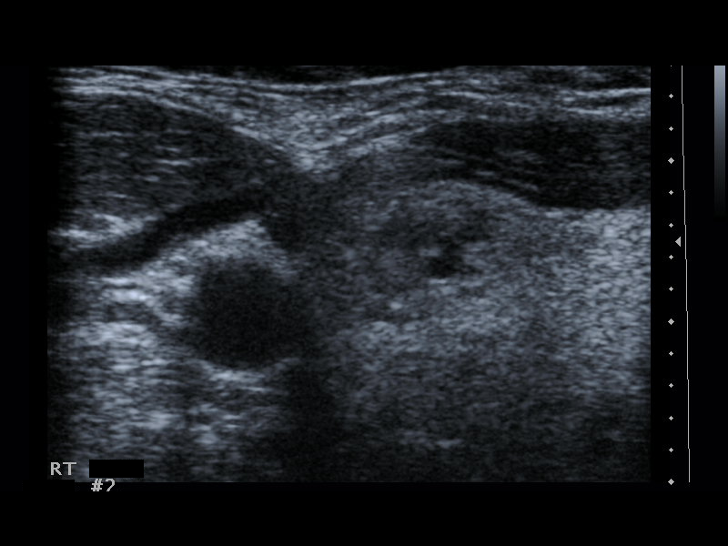
[im 8/11]
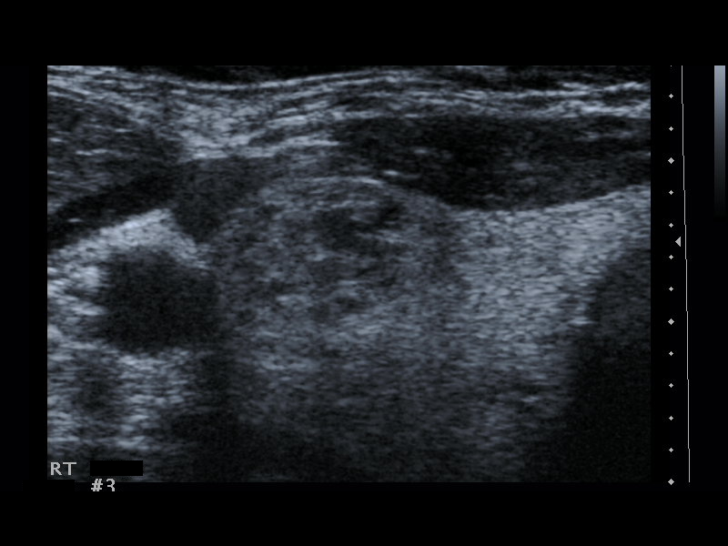
[im 9/11]
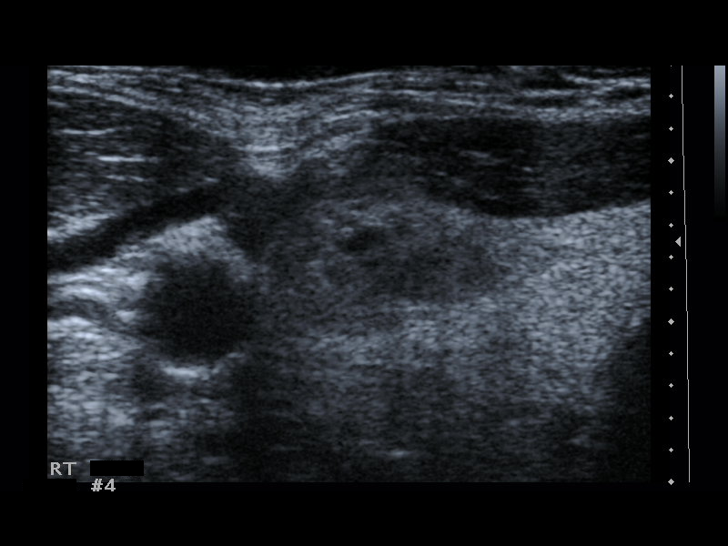
[im 10/11]
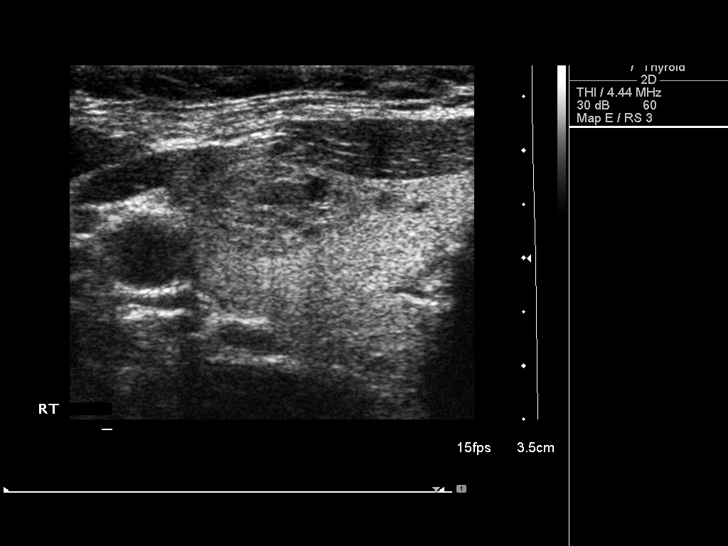
[im 11/11]
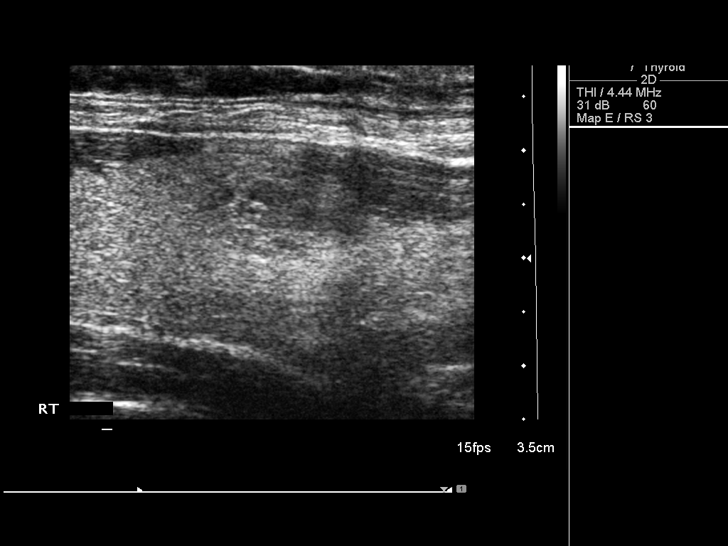

[11 of 11 positions shown; findings below may reference images not displayed]

EXAM:
ULTRASOUND GUIDED NEEDLE ASPIRATE BIOPSY OF DOMINANT RIGHT MIDPOLE
THYROID NODULE

MEDICATIONS:
None.

ANESTHESIA/SEDATION:
None

FLUOROSCOPY TIME:  None

COMPLICATIONS:
None immediate.

PROCEDURE:
Informed written consent was obtained from the patient after a
thorough discussion of the procedural risks, benefits and
alternatives. All questions were addressed. Maximal Sterile Barrier
Technique was utilized including caps, mask, sterile gowns, sterile
gloves, sterile drape, hand hygiene and skin antiseptic. A timeout
was performed prior to the initiation of the procedure.

Ultrasound was performed to localize and mark an adequate site for
the biopsy. The patient was then prepped and draped in a normal
sterile fashion. Local anesthesia was provided with 1% lidocaine.
Using direct ultrasound guidance, 4 passes were made using 25 gauge
needles into the dominant nodule within the right mid lobe of the
thyroid. Ultrasound was used to confirm needle placements on all
occasions. Specimens were sent to Pathology for analysis.
IMPRESSION: Ultrasound guided needle aspirate biopsy performed of the dominant
right mid pole thyroid nodule. Final pathology pending.

## 2016-11-14 NOTE — Progress Notes (Signed)
Complete Physical  Assessment and Plan: Essential hypertension - continue medications, DASH diet, exercise and monitor at home. Call if greater than 130/80.  - CBC with Differential/Platelet - BASIC METABOLIC PANEL WITH GFR - Hepatic function panel - TSH - Urinalysis, Routine w reflex microscopic (not at [facility]) - Microalbumin / creatinine urine ratio - EKG 12-Lead   Prediabetes Discussed general issues about diabetes pathophysiology and management., Educational material distributed., Suggested low cholesterol diet., Encouraged aerobic exercise., Discussed foot care., Reminded to get yearly retinal exam. - Hemoglobin A1c - Insulin, fasting  Hyperlipidemia -continue medications, check lipids, decrease fatty foods, increase activity.  - Lipid panel  morbid Obesity Obesity with co morbidities- long discussion about weight loss, diet, and exercise  Vitamin D deficiency - Vit D  25 hydroxy (rtn osteoporosis monitoring)  Medication management - Magnesium  Depression Negative/remission   Other migraine without status migrainosus, not intractable Avoid triggers  Routine general medical examination at a health care facility  Hyperthyroidism monitor  Varicose veins Varicose veins- weight loss discussed, continue compression stockings and elevation  Anemia, unspecified anemia type - Ferritin - Iron and TIBC - Vitamin B12  Vertigo - nonsmoker, normal neuro, does have history of vertigo in the past, flonase exercises given, add bASA, if worsening HA, changes vision/speech, imbalance, weakness go to the ER   Discussed med's effects and SE's. Screening labs and tests as requested with regular follow-up as recommended.  HPI 56 y.o. female  presents for a complete physical. Her blood pressure has been controlled at home, today their BP is BP: 128/84 She does not workout, has been trying to walk. She denies chest pain, shortness of breath.  She has had vertigo with lying down  for seconds for 6-8 weeks. While lying down at night. Some popping, clicking in her ears, no changes in vision, no HA, has appointment next month for annual eye exam.  She is on zyrtec and claritin D at day.  She is on cholesterol medication, simvastatin 40mg  and denies myalgias. Her cholesterol is at goal. The cholesterol last visit was:   Lab Results  Component Value Date   CHOL 225 (H) 04/29/2016   HDL 53 04/29/2016   LDLCALC 147 (H) 04/29/2016   TRIG 127 04/29/2016   CHOLHDL 4.2 04/29/2016   She has been working on diet and exercise for prediabetes, and denies paresthesia of the feet, polydipsia and polyuria. She is on bASA. Last A1C in the office was:  Lab Results  Component Value Date   HGBA1C 6.0 (H) 04/29/2016   Patient is on Vitamin D supplement, 2500.  Lab Results  Component Value Date   VD25OH 28 (L) 04/29/2016   She has had hyperthyroidism, she has had normal RAI uptake and negative thyroid BX.  Lab Results  Component Value Date   TSH 0.32 (L) 04/29/2016   She is on minivelle patch for hot flashes/estrogen def.  BMI is Body mass index is 42.23 kg/m., she is working on diet and exercise. Recently married, she is not on phentermine at this time.  Wt Readings from Last 3 Encounters:  11/15/16 238 lb 6.4 oz (108.1 kg)  04/29/16 252 lb (114.3 kg)  01/29/16 247 lb (112 kg)    Current Medications:  Current Outpatient Prescriptions on File Prior to Visit  Medication Sig Dispense Refill  . aspirin 81 MG tablet Take 81 mg by mouth daily.    Marland Kitchen azelastine (ASTELIN) 137 MCG/SPRAY nasal spray Place 2 sprays into both nostrils 2 (two) times daily.  Use in each nostril as directed    . Calcium-Magnesium-Zinc 167-83-8 MG TABS Take 1 tablet by mouth daily.    . cetirizine (ZYRTEC) 10 MG tablet Take 10 mg by mouth daily.    . cholecalciferol (VITAMIN D) 1000 UNITS tablet Take 5,000 Units by mouth daily.    . hydrochlorothiazide (HYDRODIURIL) 25 MG tablet TAKE 1 TABLET BY MOUTH EVERY  DAY 90 tablet 0  . phentermine (ADIPEX-P) 37.5 MG tablet Take 1 tablet (37.5 mg total) by mouth daily before breakfast. 30 tablet 2  . simvastatin (ZOCOR) 40 MG tablet TAKE 1 TABLET BY MOUTH EVERY DAY 90 tablet 1  . triamcinolone cream (KENALOG) 0.1 % Apply 1 application topically 2 (two) times daily. 80 g 1   No current facility-administered medications on file prior to visit.    Health Maintenance:   Immunization History  Administered Date(s) Administered  . Td 05/07/2007   Tetanus: 2009 due next year Pneumovax: N/A Flu vaccine: N/A Zostavax: N/A  Pap: 06/2014 due next year LMP: 2007 MGM: 06/2014 nl, DUE DEXA: 2014 normal Colonoscopy: 2010 due 2020 EGD: 01/2012- gastritis Echo: 2011 trace MR CT AB/Pelvis 06/2014 RAI thyroid uptake normal Nodule negative  Patient Care Team: Lucky CowboyMcKeown, William, MD as PCP - General (Internal Medicine) Sharrell KuMedoff, Jeffrey, MD as Consulting Physician (Gastroenterology) Teodora MediciMezer, Howard, MD as Consulting Physician (Gynecology)  Medical History:  Past Medical History:  Diagnosis Date  . Allergy   . Depression   . Hyperlipidemia   . Hypertension    Echo 2011 normal EF, trace MR  . Migraines   . Obesity   . Prediabetes   . Vitamin D deficiency    Allergies Allergies  Allergen Reactions  . Singulair [Montelukast Sodium] Swelling  . Lactose Intolerance (Gi)     SURGICAL HISTORY She  has a past surgical history that includes Abdominal hysterectomy and Stapedectomy (Right, 2000). FAMILY HISTORY Her family history includes Arthritis in her mother; Asthma in her mother; Cancer (age of onset: 5250) in her maternal grandfather; Cancer (age of onset: 5572) in her maternal grandmother; Diabetes in her daughter and father; Hyperlipidemia in her father; Hypertension in her father, mother, and sister. SOCIAL HISTORY She  reports that she has never smoked. She has never used smokeless tobacco. She reports that she does not drink alcohol or use  drugs.  Review of Systems  Constitutional: Negative.   HENT: Negative.   Eyes: Negative.   Respiratory: Negative.   Cardiovascular: Negative.   Gastrointestinal: Negative.   Genitourinary: Negative.   Musculoskeletal: Negative.   Skin: Negative.   Neurological: Positive for dizziness. Negative for tingling, tremors, sensory change, speech change, focal weakness, seizures, loss of consciousness and headaches.  Endo/Heme/Allergies: Negative.   Psychiatric/Behavioral: Negative.     Physical Exam: Estimated body mass index is 42.23 kg/m as calculated from the following:   Height as of this encounter: 5\' 3"  (1.6 m).   Weight as of this encounter: 238 lb 6.4 oz (108.1 kg). BP 128/84   Pulse (!) 102   Temp (!) 97.5 F (36.4 C)   Resp 16   Ht 5\' 3"  (1.6 m)   Wt 238 lb 6.4 oz (108.1 kg)   SpO2 98%   BMI 42.23 kg/m  Wt Readings from Last 3 Encounters:  11/15/16 238 lb 6.4 oz (108.1 kg)  04/29/16 252 lb (114.3 kg)  01/29/16 247 lb (112 kg)   General Appearance: Well nourished, in no apparent distress. Eyes: PERRLA, EOMs, conjunctiva no swelling or erythema, normal fundi  and vessels. Sinuses: No Frontal/maxillary tenderness ENT/Mouth: Ext aud canals clear, normal light reflex with TMs without erythema, bulging.  Good dentition. No erythema, swelling, or exudate on post pharynx. Tonsils not swollen or erythematous. Hearing normal.  Neck: Supple, thyroid normal. No bruits Respiratory: Respiratory effort normal, BS equal bilaterally without rales, rhonchi, wheezing or stridor. Cardio: RRR without murmurs, rubs or gallops. Brisk peripheral pulses without edema.  Chest: symmetric, with normal excursions and percussion. Breasts: defer Abdomen: Soft, +BS. Non tender, no guarding, rebound, hernias, masses, or organomegaly. .  Lymphatics: Non tender without lymphadenopathy.  Genitourinary: defer next year Musculoskeletal: Full ROM all peripheral extremities,5/5 strength, and normal  gait. Skin: Warm, dry without rashes, lesions, ecchymosis. 4x21mm nevus unchanged right medial foot Neuro: Cranial nerves intact, reflexes equal bilaterally. Normal muscle tone, no cerebellar symptoms. Sensation intact.  Psych: Awake and oriented X 3, normal affect, Insight and Judgment appropriate.   EKG: WNL no changes. AORTA SCAN: defer   Quentin Mulling 9:19 AM

## 2016-11-15 ENCOUNTER — Ambulatory Visit (INDEPENDENT_AMBULATORY_CARE_PROVIDER_SITE_OTHER): Payer: BC Managed Care – PPO | Admitting: Physician Assistant

## 2016-11-15 ENCOUNTER — Encounter: Payer: Self-pay | Admitting: Physician Assistant

## 2016-11-15 VITALS — BP 128/84 | HR 102 | Temp 97.5°F | Resp 16 | Ht 63.0 in | Wt 238.4 lb

## 2016-11-15 DIAGNOSIS — E785 Hyperlipidemia, unspecified: Secondary | ICD-10-CM

## 2016-11-15 DIAGNOSIS — R7303 Prediabetes: Secondary | ICD-10-CM

## 2016-11-15 DIAGNOSIS — Z79899 Other long term (current) drug therapy: Secondary | ICD-10-CM

## 2016-11-15 DIAGNOSIS — F3341 Major depressive disorder, recurrent, in partial remission: Secondary | ICD-10-CM

## 2016-11-15 DIAGNOSIS — I10 Essential (primary) hypertension: Secondary | ICD-10-CM

## 2016-11-15 DIAGNOSIS — Z136 Encounter for screening for cardiovascular disorders: Secondary | ICD-10-CM | POA: Diagnosis not present

## 2016-11-15 DIAGNOSIS — D649 Anemia, unspecified: Secondary | ICD-10-CM

## 2016-11-15 DIAGNOSIS — R42 Dizziness and giddiness: Secondary | ICD-10-CM

## 2016-11-15 DIAGNOSIS — I8393 Asymptomatic varicose veins of bilateral lower extremities: Secondary | ICD-10-CM

## 2016-11-15 DIAGNOSIS — Z0001 Encounter for general adult medical examination with abnormal findings: Secondary | ICD-10-CM

## 2016-11-15 DIAGNOSIS — Z0000 Encounter for general adult medical examination without abnormal findings: Secondary | ICD-10-CM

## 2016-11-15 DIAGNOSIS — E041 Nontoxic single thyroid nodule: Secondary | ICD-10-CM

## 2016-11-15 DIAGNOSIS — E559 Vitamin D deficiency, unspecified: Secondary | ICD-10-CM

## 2016-11-15 DIAGNOSIS — E0590 Thyrotoxicosis, unspecified without thyrotoxic crisis or storm: Secondary | ICD-10-CM

## 2016-11-15 DIAGNOSIS — G43809 Other migraine, not intractable, without status migrainosus: Secondary | ICD-10-CM

## 2016-11-15 LAB — IRON AND TIBC
%SAT: 26 % (ref 11–50)
Iron: 77 ug/dL (ref 45–160)
TIBC: 300 ug/dL (ref 250–450)
UIBC: 223 ug/dL

## 2016-11-15 LAB — BASIC METABOLIC PANEL WITH GFR
BUN: 12 mg/dL (ref 7–25)
CO2: 27 mmol/L (ref 20–31)
Calcium: 9.4 mg/dL (ref 8.6–10.4)
Chloride: 106 mmol/L (ref 98–110)
Creat: 0.98 mg/dL (ref 0.50–1.05)
GFR, Est African American: 76 mL/min (ref >=60)
GFR, Est Non African American: 66 mL/min (ref >=60)
Glucose, Bld: 102 mg/dL — ABNORMAL HIGH (ref 65–99)
POTASSIUM: 3.9 mmol/L (ref 3.5–5.3)
SODIUM: 141 mmol/L (ref 135–146)

## 2016-11-15 LAB — HEPATIC FUNCTION PANEL
ALK PHOS: 68 U/L (ref 33–130)
ALT: 12 U/L (ref 6–29)
AST: 12 U/L (ref 10–35)
Albumin: 4 g/dL (ref 3.6–5.1)
BILIRUBIN DIRECT: 0.1 mg/dL (ref <=0.2)
BILIRUBIN INDIRECT: 0.3 mg/dL (ref 0.2–1.2)
BILIRUBIN TOTAL: 0.4 mg/dL (ref 0.2–1.2)
Total Protein: 7 g/dL (ref 6.1–8.1)

## 2016-11-15 LAB — CBC WITH DIFFERENTIAL/PLATELET
BASOS ABS: 0 {cells}/uL (ref 0–200)
BASOS PCT: 0 %
EOS ABS: 354 {cells}/uL (ref 15–500)
EOS PCT: 6 %
HCT: 39.1 % (ref 35.0–45.0)
HEMOGLOBIN: 13 g/dL (ref 11.7–15.5)
LYMPHS ABS: 2537 {cells}/uL (ref 850–3900)
Lymphocytes Relative: 43 %
MCH: 28.1 pg (ref 27.0–33.0)
MCHC: 33.2 g/dL (ref 32.0–36.0)
MCV: 84.4 fL (ref 80.0–100.0)
MONOS PCT: 6 %
MPV: 9.7 fL (ref 7.5–12.5)
Monocytes Absolute: 354 cells/uL (ref 200–950)
Neutro Abs: 2655 cells/uL (ref 1500–7800)
Neutrophils Relative %: 45 %
PLATELETS: 320 10*3/uL (ref 140–400)
RBC: 4.63 MIL/uL (ref 3.80–5.10)
RDW: 14.4 % (ref 11.0–15.0)
WBC: 5.9 10*3/uL (ref 3.8–10.8)

## 2016-11-15 LAB — LIPID PANEL
CHOL/HDL RATIO: 3.7 ratio (ref <5.0)
Cholesterol: 211 mg/dL — ABNORMAL HIGH (ref <200)
HDL: 57 mg/dL (ref >50)
LDL CALC: 136 mg/dL — AB (ref <100)
Triglycerides: 90 mg/dL (ref <150)
VLDL: 18 mg/dL (ref <30)

## 2016-11-15 LAB — TSH: TSH: 0.28 mIU/L — ABNORMAL LOW

## 2016-11-15 NOTE — Patient Instructions (Addendum)
[facility]  7 a.m.-6:30 p.m., Monday 7 a.m.-5 p.m., Tuesday-Friday Schedule an appointment by calling [phone].   Try to get 100 oz of water a day Eat 3 meals a day- try for protein- can do protein shake like pure protein, greek yogurt, nature valley protein bar, boiled eggs Try to add veggie snacks  Your ears and sinuses are connected by the eustachian tube. When your sinuses are inflamed, this can close off the tube and cause fluid to collect in your middle ear. This can then cause dizziness, popping, clicking, ringing, and echoing in your ears. This is often NOT an infection and does NOT require antibiotics, it is caused by inflammation so the treatments help the inflammation. This can take a long time to get better so please be patient.  Here are things you can do to help with this: - Try the Flonase or Nasonex. Remember to spray each nostril twice towards the outer part of your eye.  Do not sniff but instead pinch your nose and tilt your head back to help the medicine get into your sinuses.  The best time to do this is at bedtime.Stop if you get blurred vision or nose bleeds.  -While drinking fluids, pinch and hold nose close and swallow, to help open eustachian tubes to drain fluid behind ear drums. -Please pick one of the over the counter allergy medications below and take it once daily for allergies.  It will also help with fluid behind ear drums. Claritin or loratadine cheapest but likely the weakest  Zyrtec or certizine at night because it can make you sleepy The strongest is allegra or fexafinadine  Cheapest at [employer], [employer], [employer] -can use decongestant over the counter, please do not use if you have high blood pressure or certain heart conditions.   if worsening HA, changes vision/speech, imbalance, weakness go to the ER  Benign Paroxysmal Positional Vertigo (BPPV)  General Information In Benign Paroxysmal Positional Vertigo (BPPV) dizziness  is generally thought to be due to debris which has collected within a part of the inner ear. This debris can be thought of as "ear rocks", although the formal name is "otoconia". Ear rocks are small crystals of calcium carbonate derived from a structure in the ear called the "utricle" (figure to the right ). The symptoms of BPPV include dizziness or vertigo, lightheadedness, imbalance, and nausea. Activities which bring on symptoms will vary among persons, but symptoms are usually followed by a change of position of the head like getting out of bed or rolling over in bed are common "problem" motions .  However if you have worsening HA, changes vision/speech, weakness go to the ER     What can be done? 1) Use two or more pillows at night. Avoid sleeping on the "bad" side. In the morning, get up slowly and sit on the edge of the bed for a minute. 2) Medication prescribed by your doctor.  3) The exercises below, you can do at home to help you prevent the sensation later in the day.  4) [facility], # [phone]  Home treatments: The Brandt-Daroff Exercises are a home method of treating BPPV,and are effective 95% of the time.  These exercises are performed in three sets per day for two weeks. In each set, one performs the maneuver as shown five times. Start sitting upright (position 1). Then move into the side-lying position (position 2), with  the head angled upward about halfway. An easy way to remember this is to imagine someone standing about 6 feet in front of you, and just keep looking at their head at all times. Stay in the side-lying position for 30 seconds, or until the dizziness subsides if this is longer, then go back to the sitting position (position 3). Stay there for 30 seconds, and then go to the opposite side (position 4) and follow the same routine.  At home Epley Maneuver This procedure seems to be even more effective than the  in-office procedure, perhaps because it is repeated every night for a week.  The method (for the left side) is performed as shown on the figure below.  1) One stays in each of the supine (lying down) positions for 30 seconds, and in the sitting upright position (top) for 1 minute.  2) Thus, once cycle takes 2 1/2 minutes.  3) Typically 3 cycles are performed just prior to going to sleep.  4) It is best to do them at night rather than in the morning or midday, as if one becomes dizzy following the exercises, then it can resolve while one is sleeping.  The mirror image of this procedure is used for the right ear.   Simple math prevails.    1st - exercise does not produce significant weight loss - at best one converts fat into muscle , "bulks up", loses inches, but usually stays "weight neutral"     2nd - think of your body weightas a check book: If you eat more calories than you burn up - you save money or gain weight .... Or if you spend more money than you put in the check book, ie burn up more calories than you eat, then you lose weight     3rd - if you walk or run 1 mile, you burn up 100 calories - you have to burn up 3,500 calories to lose 1 pound, ie you have to walk/run 35 miles to lose 1 measly pound. So if you want to lose 10 #, then you have to walk/run 350 miles, so.... clearly exercise is not the solution.     4. So if you consume 1,500 calories, then you have to burn up the equivalent of 15 miles to stay weight neutral - It also stands to reason that if you consume 1,500 cal/day and don't lose weight, then you must be burning up about 1,500 cals/day to stay weight neutral.     5. If you really want to lose weight, you must cut your calorie intake 300 calories /day and at that rate you should lose about 1 # every 3 days.   6. Please purchase Dr Francis Dowse Fuhrman's book(s) "The End of Dieting" & "Eat to Live" . It has some great concepts and recipes.         Bad carbs also include  fruit juice, alcohol, and sweet tea. These are empty calories that do not signal to your brain that you are full.   Please remember the good carbs are still carbs which convert into sugar. So please measure them out no more than 1/2-1 cup of rice, oatmeal, pasta, and beans  Veggies are however free foods! Pile them on.   Not all fruit is created equal. Please see the list below, the fruit at the bottom is higher in sugars than the fruit at the top. Please avoid all dried fruits.    VAGINAL DRYNESS OVERVIEW  Vaginal dryness, also  known as atrophic vaginitis, is a common condition in postmenopausal women. This condition is also common in women who have had both ovaries removed at the time of hysterectomy.   Some women have uncomfortable symptoms of vaginal dryness, such as pain with sex, burning vaginal discomfort or itching, or abnormal vaginal discharge, while others have no symptoms at all.  VAGINAL DRYNESS CAUSES   Estrogen helps to keep the vagina moist and to maintain thickness of the vaginal lining. Vaginal dryness occurs when the ovaries produce a decreased amount of estrogen. This can occur at certain times in a woman's life, and may be permanent or temporary. Times when less estrogen is made include: ?At the time of menopause. ?After surgical removal of the ovaries, chemotherapy, or radiation therapy of the pelvis for cancer. ?After having a baby, particularly in women who breastfeed. ?While using certain medications, such as danazol, medroxyprogesterone (brand names: Provera or DepoProvera), leuprolide (brand name: Lupron), or nafarelin. When these medications are stopped, estrogen production resumes.  Women who smoke cigarettes have been shown to have an increased risk of an earlier menopause transition as compared to non-smokers. Therefore, atrophic vaginitis symptoms may appear at a younger age in this population.  VAGINAL DRYNESS TREATMENT   There are three treatment options for  women with vaginal dryness:  Vaginal lubricants and moisturizers - Vaginal lubricants and moisturizers can be purchased without a prescription. These products do not contain any hormones and have virtually no side effects. - Albolene is found in the facial cleanser section at [facility], [facility], or [employer]. It is a large jar with a blue top. This is the best lubricant for women because it is hypoallergenic. -Natural lubricants, such as olive, avocado or peanut oil, are easily available products that may be used as a lubricant with sex.  -Vaginal moisturizes (eg, Replens, Moist Again, Vagisil, K-Y Silk-E, and Feminease) are formulated to allow water to be retained in the vaginal tissues. Moisturizers are applied into the vagina three times weekly to allow a continued moisturizing effect. These should not be used just before having sex, as they can be irritating.  Vaginal estrogen - Vaginal estrogen is the most effective treatment option for women with vaginal dryness. Vaginal estrogen must be prescribed by a healthcare provider. Very low doses of vaginal estrogen can be used when it is put into the vagina to treat vaginal dryness. A small amount of estrogen is absorbed into the bloodstream, but only about 100 times less than when using estrogen pills or tablets. As a result, there is a much lower risk of side effects, such as blood clots, breast cancer, and heart attack, compared with other estrogen-containing products (birth control pills, menopausal hormone therapy).   Ospemifene - Ospemifene is a prescription medication that is similar to estrogen, but is not estrogen. In the vaginal tissue, it acts similarly to estrogen. In the breast tissue, it acts as an estrogen blocker. It comes in a pill, and is prescribed for women who want to use an estrogen-like medication for vaginal dryness or painful sex associated with vaginal dryness, but prefer not to use a vaginal medication. The medication may cause hot  flashes as a side effect. This type of medication may increase the risk of blood clots or uterine cancer. Further study of ospemifene is needed to evaluate the risk of these complications. This medication has not been tested in women who have had breast cancer or are at a high risk of developing breast cancer.    Sexual  activity - Vaginal estrogen improves vaginal dryness quickly, usually within a few weeks. You may continue to have sex as you treat vaginal dryness because sex itself can help to keep the vaginal tissues healthy. Vaginal intercourse may help the vaginal tissues by keeping them soft and stretchable and preventing the tissues from shrinking.  If sex continues to be painful despite treatment for vaginal dryness, talk to your healthcare provider.

## 2016-11-16 LAB — URINALYSIS, ROUTINE W REFLEX MICROSCOPIC
BILIRUBIN URINE: NEGATIVE
Glucose, UA: NEGATIVE
Hgb urine dipstick: NEGATIVE
KETONES UR: NEGATIVE
Leukocytes, UA: NEGATIVE
Nitrite: NEGATIVE
PH: 5.5 (ref 5.0–8.0)
Protein, ur: NEGATIVE
SPECIFIC GRAVITY, URINE: 1.021 (ref 1.001–1.035)

## 2016-11-16 LAB — HEMOGLOBIN A1C
HEMOGLOBIN A1C: 6.3 % — AB (ref <5.7)
Mean Plasma Glucose: 134 mg/dL

## 2016-11-16 LAB — MICROALBUMIN / CREATININE URINE RATIO
Creatinine, Urine: 217 mg/dL (ref 20–320)
MICROALB UR: 0.2 mg/dL
Microalb Creat Ratio: 1 mcg/mg creat (ref <30)

## 2016-11-16 LAB — URINALYSIS, MICROSCOPIC ONLY
Bacteria, UA: NONE SEEN [HPF]
Casts: NONE SEEN [LPF]
Crystals: NONE SEEN [HPF]
RBC / HPF: NONE SEEN RBC/HPF (ref <=2)
Yeast: NONE SEEN [HPF]

## 2016-11-16 LAB — VITAMIN D 25 HYDROXY (VIT D DEFICIENCY, FRACTURES): VIT D 25 HYDROXY: 31 ng/mL (ref 30–100)

## 2016-11-16 LAB — VITAMIN B12: VITAMIN B 12: 526 pg/mL (ref 200–1100)

## 2016-11-16 LAB — MAGNESIUM: Magnesium: 1.9 mg/dL (ref 1.5–2.5)

## 2016-11-27 ENCOUNTER — Other Ambulatory Visit: Payer: Self-pay | Admitting: Physician Assistant

## 2016-11-27 DIAGNOSIS — I10 Essential (primary) hypertension: Secondary | ICD-10-CM

## 2016-11-27 DIAGNOSIS — E785 Hyperlipidemia, unspecified: Secondary | ICD-10-CM

## 2017-03-22 ENCOUNTER — Ambulatory Visit: Payer: Self-pay | Admitting: Physician Assistant

## 2017-04-18 ENCOUNTER — Ambulatory Visit: Payer: BC Managed Care – PPO | Admitting: Physician Assistant

## 2017-04-18 ENCOUNTER — Other Ambulatory Visit: Payer: Self-pay

## 2017-04-18 ENCOUNTER — Encounter: Payer: Self-pay | Admitting: Physician Assistant

## 2017-04-18 VITALS — BP 122/78 | HR 66 | Temp 97.7°F | Resp 16 | Ht 63.0 in | Wt 249.2 lb

## 2017-04-18 DIAGNOSIS — G5602 Carpal tunnel syndrome, left upper limb: Secondary | ICD-10-CM

## 2017-04-18 DIAGNOSIS — E785 Hyperlipidemia, unspecified: Secondary | ICD-10-CM | POA: Diagnosis not present

## 2017-04-18 DIAGNOSIS — I10 Essential (primary) hypertension: Secondary | ICD-10-CM | POA: Diagnosis not present

## 2017-04-18 DIAGNOSIS — R7303 Prediabetes: Secondary | ICD-10-CM | POA: Diagnosis not present

## 2017-04-18 DIAGNOSIS — Z79899 Other long term (current) drug therapy: Secondary | ICD-10-CM | POA: Diagnosis not present

## 2017-04-18 DIAGNOSIS — E0590 Thyrotoxicosis, unspecified without thyrotoxic crisis or storm: Secondary | ICD-10-CM | POA: Diagnosis not present

## 2017-04-18 MED ORDER — HYDROCHLOROTHIAZIDE 25 MG PO TABS: 25.0000 mg | ORAL_TABLET | Freq: Every day | ORAL | 0 refills | Status: DC

## 2017-04-18 MED ORDER — SIMVASTATIN 40 MG PO TABS: 40.0000 mg | ORAL_TABLET | Freq: Every day | ORAL | 0 refills | Status: DC

## 2017-04-18 NOTE — Patient Instructions (Signed)
Cut back on coffee Being dehydrated can hurt your kidneys, cause fatigue, headaches, muscle aches, joint pain, and dry skin/nails so please increase your fluids.   Drink 80-100 oz a day of water, measure it out! Eat 3 meals a day, have to do breakfast, eat protein- hard boiled eggs, protein bar like nature valley protein bar, greek yogurt like oikos triple zero, chobani 100, or light n fit greek  Can check out plantnanny app on your phone to help you keep track of your water       Bad carbs also include fruit juice, alcohol, and sweet tea. These are empty calories that do not signal to your brain that you are full.   Please remember the good carbs are still carbs which convert into sugar. So please measure them out no more than 1/2-1 cup of rice, oatmeal, pasta, and beans  Veggies are however free foods! Pile them on.   Not all fruit is created equal. Please see the list below, the fruit at the bottom is higher in sugars than the fruit at the top. Please avoid all dried fruits.      Take omeprazole over the counter for 2 weeks, then go to zantac 150-300 mg OR pepcid 20 or 40mg  at night for 2 weeks, then you can stop or continue as needed.  Avoid alcohol, spicy foods, NSAIDS (aleve, ibuprofen) at this time. See foods below.   Food Choices for Gastroesophageal Reflux Disease When you have gastroesophageal reflux disease (GERD), the foods you eat and your eating habits are very important. Choosing the right foods can help ease the discomfort of GERD. WHAT GENERAL GUIDELINES DO I NEED TO FOLLOW?  Choose fruits, vegetables, whole grains, low-fat dairy products, and low-fat meat, fish, and poultry.  Limit fats such as oils, salad dressings, butter, nuts, and avocado.  Keep a food diary to identify foods that cause symptoms.  Avoid foods that cause reflux. These may be different for different people.  Eat frequent small meals instead of three large meals each day.  Eat your meals  slowly, in a relaxed setting.  Limit fried foods.  Cook foods using methods other than frying.  Avoid drinking alcohol.  Avoid drinking large amounts of liquids with your meals.  Avoid bending over or lying down until 2-3 hours after eating. WHAT FOODS ARE NOT RECOMMENDED? The following are some foods and drinks that may worsen your symptoms: Vegetables Tomatoes. Tomato juice. Tomato and spaghetti sauce. Chili peppers. Onion and garlic. Horseradish. Fruits Oranges, grapefruit, and lemon (fruit and juice). Meats High-fat meats, fish, and poultry. This includes hot dogs, ribs, ham, sausage, salami, and bacon. Dairy Whole milk and chocolate milk. Sour cream. Cream. Butter. Ice cream. Cream cheese.  Beverages Coffee and tea, with or without caffeine. Carbonated beverages or energy drinks. Condiments Hot sauce. Barbecue sauce.  Sweets/Desserts Chocolate and cocoa. Donuts. Peppermint and spearmint. Fats and Oils High-fat foods, including JamaicaFrench fries and potato chips. Other Vinegar. Strong spices, such as black pepper, white pepper, red pepper, cayenne, curry powder, cloves, ginger, and chili powder.

## 2017-04-18 NOTE — Progress Notes (Signed)
3 month follow up  Assessment and Plan: Essential hypertension - continue medications, DASH diet, exercise and monitor at home. Call if greater than 130/80.  - CBC with Differential/Platelet - BASIC METABOLIC PANEL WITH GFR - Hepatic function panel - TSH - Urinalysis, Routine w reflex microscopic ([facility]) - Microalbumin / creatinine urine ratio - EKG 12-Lead  Prediabetes Discussed general issues about diabetes pathophysiology and management., Educational material distributed., Suggested low cholesterol diet., Encouraged aerobic exercise., Discussed foot care., Reminded to get yearly retinal exam. - Hemoglobin A1c - Insulin, fasting  Hyperlipidemia -continue medications, check lipids, decrease fatty foods, increase activity.  - Lipid panel  Morbid Obesity - follow up 4 months for progress monitoring - increase veggies, decrease carbs - long discussion about weight loss, diet, and exercise  Medication management - Magnesium  Hyperthyroidism Monitor  Carpal tunnel syndrome of left wrist -     Ambulatory referral to Orthopedics   Discussed med's effects and SE's. Screening labs and tests as requested with regular follow-up as recommended.  Future Appointments  Date Time Provider Department Center  08/18/2017  4:30 PM Quentin MullingCollier, Maveryck Bahri, PA-C [facility] None  12/20/2017  3:00 PM Quentin Mullingollier, Tresha Muzio, PA-C [facility] None     HPI 56 y.o. female  presents for follow up for HTN, chol, hyperthyroidism, preDM, morbid obesity.  She has history of carpal tunnel, right handed, wears brace at night and will follow up with them if continues to bother her.  Her blood pressure has been controlled at home, today their BP is BP: 122/78 She does not workout, has been trying to walk. She denies chest pain, shortness of breath, dizziness.  She is on cholesterol medication, simvastatin 40mg  and denies myalgias. Her cholesterol is at goal. The cholesterol last visit was:   Lab Results   Component Value Date   CHOL 211 (H) 11/15/2016   HDL 57 11/15/2016   LDLCALC 136 (H) 11/15/2016   TRIG 90 11/15/2016   CHOLHDL 3.7 11/15/2016   She has been working on diet and exercise for prediabetes, and denies paresthesia of the feet, polydipsia and polyuria. She is on bASA. Last A1C in the office was:  Lab Results  Component Value Date   HGBA1C 6.3 (H) 11/15/2016   Patient is on Vitamin D supplement, 5000 daily.  Lab Results  Component Value Date   VD25OH 31 11/15/2016   She has had hyperthyroidism, she has had normal RAI uptake and negative thyroid BX.  Lab Results  Component Value Date   TSH 0.28 (L) 11/15/2016   She is on minivelle patch for hot flashes/estrogen def, she is weaning off of it.   BMI is Body mass index is 44.14 kg/m., she is working on diet and exercise.  Wt Readings from Last 3 Encounters:  04/18/17 249 lb 3.2 oz (113 kg)  11/15/16 238 lb 6.4 oz (108.1 kg)  04/29/16 252 lb (114.3 kg)    Current Medications:  Current Outpatient Medications on File Prior to Visit  Medication Sig  . aspirin 81 MG tablet Take 81 mg by mouth daily.  Marland Kitchen. azelastine (ASTELIN) 137 MCG/SPRAY nasal spray Place 2 sprays into both nostrils 2 (two) times daily. Use in each nostril as directed  . Calcium-Magnesium-Zinc 167-83-8 MG TABS Take 1 tablet by mouth daily.  . cetirizine (ZYRTEC) 10 MG tablet Take 10 mg by mouth daily.  . cholecalciferol (VITAMIN D) 1000 UNITS tablet Take 5,000 Units by mouth daily.  Marland Kitchen. triamcinolone cream (KENALOG) 0.1 % Apply 1 application topically  2 (two) times daily.   No current facility-administered medications on file prior to visit.     Problem list She has Hypertension; Hyperlipidemia; Depression; Migraines; Vitamin D deficiency; Prediabetes; Morbid obesity (HCC); Medication management; Varicose veins of both lower extremities; Hyperthyroidism; Thyroid nodule; and Absolute anemia on their problem list.   Review of Systems  Constitutional:  Negative.   HENT: Negative.   Eyes: Negative.   Respiratory: Negative.   Cardiovascular: Negative.   Gastrointestinal: Negative.   Genitourinary: Negative.   Musculoskeletal: Negative.   Skin: Negative.   Neurological: Negative.   Endo/Heme/Allergies: Negative.   Psychiatric/Behavioral: Negative.     Physical Exam: Estimated body mass index is 44.14 kg/m as calculated from the following:   Height as of this encounter: 5\' 3"  (1.6 m).   Weight as of this encounter: 249 lb 3.2 oz (113 kg). BP 122/78   Pulse 66   Temp 97.7 F (36.5 C)   Resp 16   Ht 5\' 3"  (1.6 m)   Wt 249 lb 3.2 oz (113 kg)   SpO2 97%   BMI 44.14 kg/m  Wt Readings from Last 3 Encounters:  04/18/17 249 lb 3.2 oz (113 kg)  11/15/16 238 lb 6.4 oz (108.1 kg)  04/29/16 252 lb (114.3 kg)   General Appearance: Well nourished, in no apparent distress. Eyes: PERRLA, EOMs, conjunctiva no swelling or erythema, normal fundi and vessels. Sinuses: No Frontal/maxillary tenderness ENT/Mouth: Ext aud canals clear, normal light reflex with TMs without erythema, bulging.  Good dentition. No erythema, swelling, or exudate on post pharynx. Tonsils not swollen or erythematous. Hearing normal.  Neck: Supple, thyroid normal. No bruits Respiratory: Respiratory effort normal, BS equal bilaterally without rales, rhonchi, wheezing or stridor. Cardio: RRR with systolic murmur RSB without rubs or gallops. Brisk peripheral pulses without edema.  Chest: symmetric, with normal excursions and percussion. Abdomen: Soft, +BS. Non tender, no guarding, rebound, hernias, masses, or organomegaly. .  Lymphatics: Non tender without lymphadenopathy.  Musculoskeletal: Full ROM all peripheral extremities,5/5 strength, and normal gait. + finkelstein on the right.  Skin: Warm, dry without rashes, lesions, ecchymosis. Neuro: Cranial nerves intact, reflexes equal bilaterally. Normal muscle tone, no cerebellar symptoms. Sensation intact.  Psych: Awake and  oriented X 3, normal affect, Insight and Judgment appropriate.    Quentin MullingAmanda Haisley Arens 12:55 PM

## 2017-04-19 LAB — HEMOGLOBIN A1C
Hgb A1c MFr Bld: 6.3 % of total Hgb — ABNORMAL HIGH (ref <5.7)
Mean Plasma Glucose: 134 (calc)
eAG (mmol/L): 7.4 (calc)

## 2017-04-19 LAB — CBC WITH DIFFERENTIAL/PLATELET
BASOS PCT: 0.6 %
Basophils Absolute: 32 cells/uL (ref 0–200)
EOS PCT: 6.1 %
Eosinophils Absolute: 329 cells/uL (ref 15–500)
HEMATOCRIT: 38.6 % (ref 35.0–45.0)
HEMOGLOBIN: 12.7 g/dL (ref 11.7–15.5)
LYMPHS ABS: 2295 {cells}/uL (ref 850–3900)
MCH: 27.3 pg (ref 27.0–33.0)
MCHC: 32.9 g/dL (ref 32.0–36.0)
MCV: 83 fL (ref 80.0–100.0)
MPV: 10.4 fL (ref 7.5–12.5)
Monocytes Relative: 5.1 %
NEUTROS ABS: 2468 {cells}/uL (ref 1500–7800)
Neutrophils Relative %: 45.7 %
Platelets: 306 10*3/uL (ref 140–400)
RBC: 4.65 10*6/uL (ref 3.80–5.10)
RDW: 13.5 % (ref 11.0–15.0)
Total Lymphocyte: 42.5 %
WBC mixed population: 275 cells/uL (ref 200–950)
WBC: 5.4 10*3/uL (ref 3.8–10.8)

## 2017-04-19 LAB — HEPATIC FUNCTION PANEL
AG Ratio: 1.3 (calc) (ref 1.0–2.5)
ALBUMIN MSPROF: 4 g/dL (ref 3.6–5.1)
ALT: 11 U/L (ref 6–29)
AST: 13 U/L (ref 10–35)
Alkaline phosphatase (APISO): 70 U/L (ref 33–130)
BILIRUBIN TOTAL: 0.4 mg/dL (ref 0.2–1.2)
Bilirubin, Direct: 0.1 mg/dL (ref 0.0–0.2)
Globulin: 3 g/dL (calc) (ref 1.9–3.7)
Indirect Bilirubin: 0.3 mg/dL (calc) (ref 0.2–1.2)
Total Protein: 7 g/dL (ref 6.1–8.1)

## 2017-04-19 LAB — LIPID PANEL
Cholesterol: 224 mg/dL — ABNORMAL HIGH (ref <200)
HDL: 52 mg/dL (ref >50)
LDL Cholesterol (Calc): 151 mg/dL (calc) — ABNORMAL HIGH
NON-HDL CHOLESTEROL (CALC): 172 mg/dL — AB (ref <130)
TRIGLYCERIDES: 97 mg/dL (ref <150)
Total CHOL/HDL Ratio: 4.3 (calc) (ref <5.0)

## 2017-04-19 LAB — BASIC METABOLIC PANEL WITH GFR
BUN: 16 mg/dL (ref 7–25)
CALCIUM: 9.6 mg/dL (ref 8.6–10.4)
CO2: 30 mmol/L (ref 20–32)
CREATININE: 1 mg/dL (ref 0.50–1.05)
Chloride: 106 mmol/L (ref 98–110)
GFR, EST NON AFRICAN AMERICAN: 63 mL/min/{1.73_m2} (ref >=60)
GFR, Est African American: 73 mL/min/{1.73_m2} (ref >=60)
Glucose, Bld: 109 mg/dL — ABNORMAL HIGH (ref 65–99)
POTASSIUM: 4.5 mmol/L (ref 3.5–5.3)
Sodium: 141 mmol/L (ref 135–146)

## 2017-04-19 LAB — MAGNESIUM: Magnesium: 1.8 mg/dL (ref 1.5–2.5)

## 2017-04-19 LAB — TSH: TSH: 0.3 m[IU]/L — AB

## 2017-07-13 ENCOUNTER — Other Ambulatory Visit: Payer: Self-pay

## 2017-07-13 DIAGNOSIS — I10 Essential (primary) hypertension: Secondary | ICD-10-CM

## 2017-07-13 MED ORDER — HYDROCHLOROTHIAZIDE 25 MG PO TABS: 25.0000 mg | ORAL_TABLET | Freq: Every day | ORAL | 0 refills | Status: DC

## 2017-08-18 ENCOUNTER — Encounter: Payer: Self-pay | Admitting: Physician Assistant

## 2017-08-18 ENCOUNTER — Ambulatory Visit: Payer: BC Managed Care – PPO | Admitting: Physician Assistant

## 2017-08-18 VITALS — BP 124/76 | Temp 97.5°F | Resp 16 | Ht 63.0 in | Wt 244.6 lb

## 2017-08-18 DIAGNOSIS — E785 Hyperlipidemia, unspecified: Secondary | ICD-10-CM

## 2017-08-18 DIAGNOSIS — E0590 Thyrotoxicosis, unspecified without thyrotoxic crisis or storm: Secondary | ICD-10-CM

## 2017-08-18 DIAGNOSIS — R0789 Other chest pain: Secondary | ICD-10-CM

## 2017-08-18 DIAGNOSIS — I10 Essential (primary) hypertension: Secondary | ICD-10-CM

## 2017-08-18 DIAGNOSIS — R7303 Prediabetes: Secondary | ICD-10-CM

## 2017-08-18 DIAGNOSIS — Z79899 Other long term (current) drug therapy: Secondary | ICD-10-CM

## 2017-08-18 MED ORDER — ALBUTEROL SULFATE HFA 108 (90 BASE) MCG/ACT IN AERS: 2.0000 | INHALATION_SPRAY | Freq: Four times a day (QID) | RESPIRATORY_TRACT | 1 refills | Status: AC | PRN

## 2017-08-18 NOTE — Patient Instructions (Signed)
Can do steroid inhaler, NEED TO DO DAILY, this is NOT a rescue inhaler so if you are acutely short of breath please use your albuterol or call 911.  Do 1 puff twice a day.  Do before you brush your teeth OR wash your mouth afterwards.  IF YOU DO NOT WASH YOUR MOUTH OUT IT CAN CAUSE YEAST Can do 2 tsp vinegar with water and switch to help prevent yeast or help yeast in your mouth.   Go to the ER if any chest pain, shortness of breath, nausea, dizziness, severe HA, changes vision/speech  Can do the albuterol inhaler AS needed for wheezing/shortness of breath  Get on flonase/nasonex Can do a steroid nasal spary 1-2 sparys at night each nostril. Remember to spray each nostril twice towards the outer part of your eye.  Do not sniff but instead pinch your nose and tilt your head back to help the medicine get into your sinuses.  The best time to do this is at bedtime. Stop if you get blurred vision or nose bleeds.

## 2017-08-18 NOTE — Progress Notes (Signed)
3 month follow up  Assessment and Plan: Essential hypertension - continue medications, DASH diet, exercise and monitor at home. Call if greater than 130/80.  - CBC with Differential/Platelet - BASIC METABOLIC PANEL WITH GFR - Hepatic function panel - TSH  Prediabetes Discussed general issues about diabetes pathophysiology and management., Educational material distributed., Suggested low cholesterol diet., Encouraged aerobic exercise., Discussed foot care., Reminded to get yearly retinal exam. - Hemoglobin A1c - Insulin, fasting  Hyperlipidemia -continue medications, check lipids, decrease fatty foods, increase activity.  - Lipid panel  Morbid Obesity - follow up 4 months for progress monitoring - increase veggies, decrease carbs - long discussion about weight loss, diet, and exercise  Medication management - Magnesium  Hyperthyroidism Monitor  Chest pressure/tightness Non exertional Likely more with wheezing/coughing will treat lungs- dulera and albuerol given Go to the ER if any chest pain, shortness of breath, nausea, dizziness, severe HA, changes vision/speech   Discussed med's effects and SE's. Screening labs and tests as requested with regular follow-up as recommended.  Future Appointments  Date Time Provider Department Center  12/20/2017  3:00 PM Quentin Mullingollier, Aristidis Talerico, PA-C [id number] None     HPI 57 y.o. female  presents for follow up for HTN, chol, hyperthyroidism, preDM, morbid obesity.   She states Monday she had tightness in her chest, when she will walk to her building to her car she feels she has chest tightness. She can have it with or without exertion, chest tightness without radiation. No nausea, dizziness, sweating, palpitations. No swelling in legs, no PND. No heart disease in her family. She denies GERD. No fever, chills.  She has some cough, sinus congestion. Using zyrtec, astelin.     Her blood pressure has been controlled at home, today their BP is  BP: 124/76 She does not workout.  She denies chest pain, dizziness.  She is on cholesterol medication, simvastatin 40mg  and denies myalgias. Her cholesterol is at goal. The cholesterol last visit was:   Lab Results  Component Value Date   CHOL 224 (H) 04/18/2017   HDL 52 04/18/2017   LDLCALC 151 (H) 04/18/2017   TRIG 97 04/18/2017   CHOLHDL 4.3 04/18/2017   She has been working on diet and exercise for prediabetes, and denies paresthesia of the feet, polydipsia and polyuria. She is on bASA. Last A1C in the office was:  Lab Results  Component Value Date   HGBA1C 6.3 (H) 04/18/2017   Patient is on Vitamin D supplement, 5000 daily.  Lab Results  Component Value Date   VD25OH 31 11/15/2016   She has had hyperthyroidism, she has had normal RAI uptake and negative thyroid BX.  Lab Results  Component Value Date   TSH 0.30 (L) 04/18/2017   She has been off estrogen x 2 years.  BMI is Body mass index is 43.33 kg/m., she is working on diet and exercise.  Wt Readings from Last 3 Encounters:  08/18/17 244 lb 9.6 oz (110.9 kg)  04/18/17 249 lb 3.2 oz (113 kg)  11/15/16 238 lb 6.4 oz (108.1 kg)    Current Medications:  Current Outpatient Medications on File Prior to Visit  Medication Sig  . aspirin 81 MG tablet Take 81 mg by mouth daily.  Marland Kitchen. azelastine (ASTELIN) 137 MCG/SPRAY nasal spray Place 2 sprays into both nostrils 2 (two) times daily. Use in each nostril as directed  . Calcium-Magnesium-Zinc 167-83-8 MG TABS Take 1 tablet by mouth daily.  . cetirizine (ZYRTEC) 10 MG tablet Take 10 mg by  mouth daily.  . cholecalciferol (VITAMIN D) 1000 UNITS tablet Take 5,000 Units by mouth daily.  . hydrochlorothiazide (HYDRODIURIL) 25 MG tablet Take 1 tablet (25 mg total) by mouth daily.  . simvastatin (ZOCOR) 40 MG tablet Take 1 tablet (40 mg total) by mouth daily.  Marland Kitchen triamcinolone cream (KENALOG) 0.1 % Apply 1 application topically 2 (two) times daily.   No current facility-administered  medications on file prior to visit.     Problem list She has Hypertension; Hyperlipidemia; Depression; Migraines; Vitamin D deficiency; Prediabetes; Morbid obesity (HCC); Medication management; Varicose veins of both lower extremities; Hyperthyroidism; Thyroid nodule; and Absolute anemia on their problem list.   A SURGICAL HISTORY She  has a past surgical history that includes Abdominal hysterectomy and Stapedectomy (Right, 2000). FAMILY HISTORY Her family history includes Arthritis in her mother; Asthma in her mother; Cancer (age of onset: 70) in her maternal grandfather; Cancer (age of onset: 74) in her maternal grandmother; Diabetes in her daughter and father; Hyperlipidemia in her father; Hypertension in her father, mother, and sister. SOCIAL HISTORY She  reports that she has never smoked. She has never used smokeless tobacco. She reports that she does not drink alcohol or use drugs.  Review of Systems  Constitutional: Negative.   HENT: Negative.   Eyes: Negative.   Respiratory: Negative.   Cardiovascular: Negative.   Gastrointestinal: Negative.   Genitourinary: Negative.   Musculoskeletal: Negative.   Skin: Negative.   Neurological: Negative.   Endo/Heme/Allergies: Negative.   Psychiatric/Behavioral: Negative.     Physical Exam: Estimated body mass index is 43.33 kg/m as calculated from the following:   Height as of this encounter: 5\' 3"  (1.6 m).   Weight as of this encounter: 244 lb 9.6 oz (110.9 kg). BP 124/76   Temp (!) 97.5 F (36.4 C)   Resp 16   Ht 5\' 3"  (1.6 m)   Wt 244 lb 9.6 oz (110.9 kg)   SpO2 98%   BMI 43.33 kg/m  Wt Readings from Last 3 Encounters:  08/18/17 244 lb 9.6 oz (110.9 kg)  04/18/17 249 lb 3.2 oz (113 kg)  11/15/16 238 lb 6.4 oz (108.1 kg)   General Appearance: Well nourished, in no apparent distress. Eyes: PERRLA, EOMs, conjunctiva no swelling or erythema, normal fundi and vessels. Sinuses: No Frontal/maxillary tenderness ENT/Mouth: Ext  aud canals clear, normal light reflex with TMs without erythema, bulging.  Good dentition. No erythema, swelling, or exudate on post pharynx. Tonsils not swollen or erythematous. Hearing normal.  Neck: Supple, thyroid normal. No bruits Respiratory: Respiratory effort normal, BS equal bilaterally without rales, rhonchi, wheezing or stridor. Cardio: RRR with systolic murmur RSB without rubs or gallops. Brisk peripheral pulses without edema.  Chest: symmetric, with normal excursions and percussion. Abdomen: Soft, +BS. Non tender, no guarding, rebound, hernias, masses, or organomegaly. .  Lymphatics: Non tender without lymphadenopathy.  Musculoskeletal: Full ROM all peripheral extremities,5/5 strength, and normal gait. + finkelstein on the right.  Skin: Warm, dry without rashes, lesions, ecchymosis. Neuro: Cranial nerves intact, reflexes equal bilaterally. Normal muscle tone, no cerebellar symptoms. Sensation intact.  Psych: Awake and oriented X 3, normal affect, Insight and Judgment appropriate.    Quentin Mulling 5:00 PM

## 2017-08-19 ENCOUNTER — Other Ambulatory Visit: Payer: Self-pay | Admitting: Physician Assistant

## 2017-08-19 LAB — COMPLETE METABOLIC PANEL WITH GFR
AG Ratio: 1.4 (calc) (ref 1.0–2.5)
ALT: 13 U/L (ref 6–29)
AST: 12 U/L (ref 10–35)
Albumin: 4.4 g/dL (ref 3.6–5.1)
Alkaline phosphatase (APISO): 82 U/L (ref 33–130)
BUN/Creatinine Ratio: 10 (calc) (ref 6–22)
BUN: 12 mg/dL (ref 7–25)
CALCIUM: 9.6 mg/dL (ref 8.6–10.4)
CO2: 27 mmol/L (ref 20–32)
CREATININE: 1.16 mg/dL — AB (ref 0.50–1.05)
Chloride: 106 mmol/L (ref 98–110)
GFR, EST NON AFRICAN AMERICAN: 53 mL/min/{1.73_m2} — AB (ref >=60)
GFR, Est African American: 61 mL/min/{1.73_m2} (ref >=60)
GLOBULIN: 3.1 g/dL (ref 1.9–3.7)
Glucose, Bld: 107 mg/dL — ABNORMAL HIGH (ref 65–99)
Potassium: 3.9 mmol/L (ref 3.5–5.3)
Sodium: 140 mmol/L (ref 135–146)
TOTAL PROTEIN: 7.5 g/dL (ref 6.1–8.1)
Total Bilirubin: 0.3 mg/dL (ref 0.2–1.2)

## 2017-08-19 LAB — CBC WITH DIFFERENTIAL/PLATELET
BASOS ABS: 40 {cells}/uL (ref 0–200)
Basophils Relative: 0.6 %
Eosinophils Absolute: 370 cells/uL (ref 15–500)
Eosinophils Relative: 5.6 %
HEMATOCRIT: 39 % (ref 35.0–45.0)
HEMOGLOBIN: 13.4 g/dL (ref 11.7–15.5)
LYMPHS ABS: 1558 {cells}/uL (ref 850–3900)
MCH: 27.9 pg (ref 27.0–33.0)
MCHC: 34.4 g/dL (ref 32.0–36.0)
MCV: 81.3 fL (ref 80.0–100.0)
MPV: 10.5 fL (ref 7.5–12.5)
Monocytes Relative: 4.7 %
NEUTROS ABS: 4323 {cells}/uL (ref 1500–7800)
NEUTROS PCT: 65.5 %
Platelets: 309 10*3/uL (ref 140–400)
RBC: 4.8 10*6/uL (ref 3.80–5.10)
RDW: 13.5 % (ref 11.0–15.0)
Total Lymphocyte: 23.6 %
WBC: 6.6 10*3/uL (ref 3.8–10.8)
WBCMIX: 310 {cells}/uL (ref 200–950)

## 2017-08-19 LAB — LIPID PANEL
CHOL/HDL RATIO: 4.2 (calc) (ref <5.0)
Cholesterol: 224 mg/dL — ABNORMAL HIGH (ref <200)
HDL: 53 mg/dL (ref >50)
LDL Cholesterol (Calc): 148 mg/dL (calc) — ABNORMAL HIGH
NON-HDL CHOLESTEROL (CALC): 171 mg/dL — AB (ref <130)
TRIGLYCERIDES: 114 mg/dL (ref <150)

## 2017-08-19 LAB — TSH: TSH: 0.31 m[IU]/L — ABNORMAL LOW

## 2017-08-19 LAB — HEMOGLOBIN A1C
Hgb A1c MFr Bld: 6.2 % of total Hgb — ABNORMAL HIGH (ref <5.7)
Mean Plasma Glucose: 131 (calc)
eAG (mmol/L): 7.3 (calc)

## 2017-08-19 MED ORDER — EZETIMIBE 10 MG PO TABS: 10.0000 mg | ORAL_TABLET | Freq: Every day | ORAL | 1 refills | Status: DC

## 2017-11-15 ENCOUNTER — Encounter: Payer: Self-pay | Admitting: Physician Assistant

## 2017-12-18 NOTE — Progress Notes (Signed)
Complete Physical  Assessment and Plan: Essential hypertension - continue medications, DASH diet, exercise and monitor at home. Call if greater than 130/80.  - CBC with Differential/Platelet - BASIC METABOLIC PANEL WITH GFR - Hepatic function panel - TSH - Urinalysis, Routine w reflex microscopic (not at [facility]) - Microalbumin / creatinine urine ratio - EKG 12-Lead   Prediabetes Discussed general issues about diabetes pathophysiology and management., Educational material distributed., Suggested low cholesterol diet., Encouraged aerobic exercise., Discussed foot care., Reminded to get yearly retinal exam. - Hemoglobin A1c - Insulin, fasting  Hyperlipidemia -continue medications, check lipids, decrease fatty foods, increase activity.  - Lipid panel  morbid Obesity Obesity with co morbidities- long discussion about weight loss, diet, and exercise  Vitamin D deficiency - Vit D  25 hydroxy (rtn osteoporosis monitoring)  Medication management - Magnesium  Depression Negative/remission   Other migraine without status migrainosus, not intractable Avoid triggers  Routine general medical examination at a health care facility DUE MGM PAP today  Hyperthyroidism monitor  Varicose veins Varicose veins- weight loss discussed, continue compression stockings and elevation  Anemia, unspecified anemia type   Discussed med's effects and SE's. Screening labs and tests as requested with regular follow-up as recommended.  HPI 57 y.o. female  presents for a complete physical. Her blood pressure has been controlled at home, she is on HCTZ 1/2 pill daily, she gets leg cramps when she takes a whole pill, today their BP is BP: (!) 151/99  BP Readings from Last 3 Encounters:  12/20/17 (!) 151/99  08/18/17 124/76  04/18/17 122/78    She does not workout, has been trying to walk. She denies chest pain, shortness of breath.    She is on cholesterol medication, simvastatin 40mg  and zetia  and denies myalgias. Her cholesterol is at goal. The cholesterol last visit was:   Lab Results  Component Value Date   CHOL 224 (H) 08/18/2017   HDL 53 08/18/2017   LDLCALC 148 (H) 08/18/2017   TRIG 114 08/18/2017   CHOLHDL 4.2 08/18/2017   She has been working on diet and exercise for prediabetes, and denies paresthesia of the feet, polydipsia and polyuria. She is on bASA. Last A1C in the office was:  Lab Results  Component Value Date   HGBA1C 6.2 (H) 08/18/2017   Patient is on Vitamin D supplement, 2500.  Lab Results  Component Value Date   VD25OH 31 11/15/2016   She has had hyperthyroidism, she has had normal RAI uptake and negative thyroid BX.  Lab Results  Component Value Date   TSH 0.31 (L) 08/18/2017   She is off estrogen for hot flashes/estrogen def.  BMI is Body mass index is 41.95 kg/m., she is working on diet and exercise. Recently married, she is not on phentermine at this time.  Wt Readings from Last 3 Encounters:  12/20/17 244 lb 6.4 oz (110.9 kg)  08/18/17 244 lb 9.6 oz (110.9 kg)  04/18/17 249 lb 3.2 oz (113 kg)    Current Medications:  Current Outpatient Medications on File Prior to Visit  Medication Sig Dispense Refill  . albuterol (PROAIR HFA) 108 (90 Base) MCG/ACT inhaler Inhale 2 puffs into the lungs every 6 (six) hours as needed for wheezing or shortness of breath. 18 g 1  . aspirin 81 MG tablet Take 81 mg by mouth daily.    Marland Kitchen. azelastine (ASTELIN) 137 MCG/SPRAY nasal spray Place 2 sprays into both nostrils 2 (two) times daily. Use in each nostril as directed    .  Calcium-Magnesium-Zinc 167-83-8 MG TABS Take 1 tablet by mouth daily.    . cetirizine (ZYRTEC) 10 MG tablet Take 10 mg by mouth daily.    . cholecalciferol (VITAMIN D) 1000 UNITS tablet Take 5,000 Units by mouth daily.    Marland Kitchen ezetimibe (ZETIA) 10 MG tablet Take 1 tablet (10 mg total) by mouth daily. 90 tablet 1  . hydrochlorothiazide (HYDRODIURIL) 25 MG tablet Take 1 tablet (25 mg total) by  mouth daily. 90 tablet 0  . simvastatin (ZOCOR) 40 MG tablet Take 1 tablet (40 mg total) by mouth daily. 90 tablet 0  . triamcinolone cream (KENALOG) 0.1 % Apply 1 application topically 2 (two) times daily. 80 g 1   No current facility-administered medications on file prior to visit.    Health Maintenance:   Immunization History  Administered Date(s) Administered  . Td 05/07/2007   Tetanus: 2009 due  Pneumovax: N/A Flu vaccine: N/A Zostavax: N/A  Pap: 06/2014 due next year LMP: 2007 MGM: 06/2014 nl, DUE DEXA: 2014 normal Colonoscopy: 2010 due 2020 EGD: 01/2012- gastritis Echo: 2011 trace MR CT AB/Pelvis 06/2014 RAI thyroid uptake normal Nodule negative  Patient Care Team: Lucky Cowboy, MD as PCP - General (Internal Medicine) Sharrell Ku, MD as Consulting Physician (Gastroenterology) Teodora Medici, MD as Consulting Physician (Gynecology)  Medical History:  Past Medical History:  Diagnosis Date  . Allergy   . Depression   . Hyperlipidemia   . Hypertension    Echo 2011 normal EF, trace MR  . Migraines   . Obesity   . Prediabetes   . Vitamin D deficiency    Allergies Allergies  Allergen Reactions  . Singulair [Montelukast Sodium] Swelling  . Lactose Intolerance (Gi)     SURGICAL HISTORY She  has a past surgical history that includes Abdominal hysterectomy and Stapedectomy (Right, 2000). FAMILY HISTORY Her family history includes Arthritis in her mother; Asthma in her mother; Cancer (age of onset: 41) in her maternal grandfather; Cancer (age of onset: 55) in her maternal grandmother; Diabetes in her daughter and father; Hyperlipidemia in her father; Hypertension in her father, mother, and sister. SOCIAL HISTORY She  reports that she has never smoked. She has never used smokeless tobacco. She reports that she does not drink alcohol or use drugs.  Review of Systems  Constitutional: Negative.   HENT: Negative.   Eyes: Negative.   Respiratory: Negative.    Cardiovascular: Negative.   Gastrointestinal: Negative.   Genitourinary: Negative.   Musculoskeletal: Negative.   Skin: Negative.   Neurological: Negative for dizziness, tingling, tremors, sensory change, speech change, focal weakness, seizures, loss of consciousness and headaches.  Endo/Heme/Allergies: Negative.   Psychiatric/Behavioral: Negative.     Physical Exam: Estimated body mass index is 41.95 kg/m as calculated from the following:   Height as of this encounter: 5\' 4"  (1.626 m).   Weight as of this encounter: 244 lb 6.4 oz (110.9 kg). BP (!) 151/99   Pulse 86   Temp 98 F (36.7 C)   Resp 18   Ht 5\' 4"  (1.626 m)   Wt 244 lb 6.4 oz (110.9 kg)   SpO2 98%   BMI 41.95 kg/m  Wt Readings from Last 3 Encounters:  12/20/17 244 lb 6.4 oz (110.9 kg)  08/18/17 244 lb 9.6 oz (110.9 kg)  04/18/17 249 lb 3.2 oz (113 kg)   General Appearance: Well nourished, in no apparent distress. Eyes: PERRLA, EOMs, conjunctiva no swelling or erythema, normal fundi and vessels. Sinuses: No Frontal/maxillary tenderness ENT/Mouth: Ext  aud canals clear, normal light reflex with TMs without erythema, bulging.  Good dentition. No erythema, swelling, or exudate on post pharynx. Tonsils not swollen or erythematous. Hearing normal.  Neck: Supple, thyroid normal. No bruits Respiratory: Respiratory effort normal, BS equal bilaterally without rales, rhonchi, wheezing or stridor. Cardio: RRR without murmurs, rubs or gallops. Brisk peripheral pulses without edema.  Chest: symmetric, with normal excursions and percussion. Breasts: no masses Abdomen: Soft, +BS. Non tender, no guarding, rebound, hernias, masses, or organomegaly. .  Lymphatics: Non tender without lymphadenopathy.  Genitourinary: normal external genitalia, vulva, vagina, cervix, uterus and adnexa. Musculoskeletal: Full ROM all peripheral extremities,5/5 strength, and normal gait. Skin: Warm, dry without rashes, lesions, ecchymosis. 4x655mm nevus  unchanged right medial foot Neuro: Cranial nerves intact, reflexes equal bilaterally. Normal muscle tone, no cerebellar symptoms. Sensation intact.  Psych: Awake and oriented X 3, normal affect, Insight and Judgment appropriate.   EKG: WNL no changes. AORTA SCAN: defer   Quentin Mullingmanda Dreon Pineda 3:19 PM

## 2017-12-20 ENCOUNTER — Ambulatory Visit (INDEPENDENT_AMBULATORY_CARE_PROVIDER_SITE_OTHER): Payer: BC Managed Care – PPO | Admitting: Physician Assistant

## 2017-12-20 ENCOUNTER — Other Ambulatory Visit (HOSPITAL_COMMUNITY)
Admission: RE | Admit: 2017-12-20 | Discharge: 2017-12-20 | Disposition: A | Discharge status: Home / Self Care | Payer: BC Managed Care – PPO | Source: Ambulatory Visit | Attending: Physician Assistant | Admitting: Physician Assistant

## 2017-12-20 ENCOUNTER — Encounter: Payer: Self-pay | Admitting: Physician Assistant

## 2017-12-20 VITALS — BP 151/99 | HR 86 | Temp 98.0°F | Resp 18 | Ht 64.0 in | Wt 244.4 lb

## 2017-12-20 DIAGNOSIS — Z0000 Encounter for general adult medical examination without abnormal findings: Secondary | ICD-10-CM | POA: Diagnosis not present

## 2017-12-20 DIAGNOSIS — Z136 Encounter for screening for cardiovascular disorders: Secondary | ICD-10-CM | POA: Diagnosis not present

## 2017-12-20 DIAGNOSIS — Z13220 Encounter for screening for lipoid disorders: Secondary | ICD-10-CM | POA: Diagnosis not present

## 2017-12-20 DIAGNOSIS — Z6841 Body Mass Index (BMI) 40.0 and over, adult: Secondary | ICD-10-CM

## 2017-12-20 DIAGNOSIS — Z79899 Other long term (current) drug therapy: Secondary | ICD-10-CM

## 2017-12-20 DIAGNOSIS — Z1329 Encounter for screening for other suspected endocrine disorder: Secondary | ICD-10-CM | POA: Diagnosis not present

## 2017-12-20 DIAGNOSIS — E041 Nontoxic single thyroid nodule: Secondary | ICD-10-CM

## 2017-12-20 DIAGNOSIS — E559 Vitamin D deficiency, unspecified: Secondary | ICD-10-CM | POA: Diagnosis not present

## 2017-12-20 DIAGNOSIS — Z124 Encounter for screening for malignant neoplasm of cervix: Secondary | ICD-10-CM | POA: Insufficient documentation

## 2017-12-20 DIAGNOSIS — G43809 Other migraine, not intractable, without status migrainosus: Secondary | ICD-10-CM

## 2017-12-20 DIAGNOSIS — F3341 Major depressive disorder, recurrent, in partial remission: Secondary | ICD-10-CM

## 2017-12-20 DIAGNOSIS — Z1389 Encounter for screening for other disorder: Secondary | ICD-10-CM

## 2017-12-20 DIAGNOSIS — Z131 Encounter for screening for diabetes mellitus: Secondary | ICD-10-CM

## 2017-12-20 DIAGNOSIS — I10 Essential (primary) hypertension: Secondary | ICD-10-CM | POA: Diagnosis not present

## 2017-12-20 DIAGNOSIS — D649 Anemia, unspecified: Secondary | ICD-10-CM

## 2017-12-20 DIAGNOSIS — E0590 Thyrotoxicosis, unspecified without thyrotoxic crisis or storm: Secondary | ICD-10-CM

## 2017-12-20 DIAGNOSIS — E785 Hyperlipidemia, unspecified: Secondary | ICD-10-CM

## 2017-12-20 DIAGNOSIS — R87610 Atypical squamous cells of undetermined significance on cytologic smear of cervix (ASC-US): Secondary | ICD-10-CM | POA: Diagnosis not present

## 2017-12-20 DIAGNOSIS — Z23 Encounter for immunization: Secondary | ICD-10-CM | POA: Diagnosis not present

## 2017-12-20 DIAGNOSIS — I8393 Asymptomatic varicose veins of bilateral lower extremities: Secondary | ICD-10-CM

## 2017-12-20 DIAGNOSIS — R7303 Prediabetes: Secondary | ICD-10-CM

## 2017-12-20 MED ORDER — ENALAPRIL MALEATE 5 MG PO TABS: 5.0000 mg | ORAL_TABLET | Freq: Every day | ORAL | 11 refills | Status: DC

## 2017-12-20 NOTE — Patient Instructions (Addendum)
[facility]  7 a.m.-6:30 p.m., Monday 7 a.m.-5 p.m., Tuesday-Friday Schedule an appointment by calling [phone].   ACE inhibitors are blood pressure medications that protect your heart and kidneys. It can cause two symptoms: The most common symptom is a dry cough/tickle in your throat that can happen the first day you take it or 5 years after you have been taking it. Please call us if you have this and we can switch it to a different medications. The least common side effect is called angioedema which is swelling of your lips and tongue and can cause problems with your breathing. This is a very very rare side effect but very serious. If this happens please stop the medication and go to the ER.   Take enalapril 5 mg and the HCTZ 12.5mg  Check BP if this helps we will send in for you  Monitor your blood pressure at home, please keep a record and bring that in with you to your next office visit.   Go to the ER if any CP, SOB, nausea, dizziness, severe HA, changes vision/speech  Due to a recent study, SPRINT, we have changed our goal for the systolic or top blood pressure number. Ideally we want your top number at 120.  In the [facility] Trial, 5000 people were randomized to a goal BP of 120 and 5000 people were randomized to a goal BP of less than 140. The patients with the goal BP at 120 had LESS DEMENTIA, LESS HEART ATTACKS, AND LESS STROKES, AS WELL AS OVERALL DECREASED MORTALITY OR DEATH RATE.   If you are willing, our goal BP is the top number of 120.  Your most recent BP: BP: (!) 151/99   Take your medications faithfully as instructed. Maintain a healthy weight. Get at least 150 minutes of aerobic exercise per week. Minimize salt intake. Minimize alcohol intake  DASH Eating Plan DASH stands for "Dietary Approaches to Stop Hypertension." The DASH eating plan is a healthy eating plan that has been shown to reduce high blood pressure (hypertension).  Additional health benefits may include reducing the risk of type 2 diabetes mellitus, heart disease, and stroke. The DASH eating plan may also help with weight loss. WHAT DO I NEED TO KNOW ABOUT THE DASH EATING PLAN? For the DASH eating plan, you will follow these general guidelines:  Choose foods with a percent daily value for sodium of less than 5% (as listed on the food label).  Use salt-free seasonings or herbs instead of table salt or sea salt.  Check with your health care provider or pharmacist before using salt substitutes.  Eat lower-sodium products, often labeled as "lower sodium" or "no salt added."  Eat fresh foods.  Eat more vegetables, fruits, and low-fat dairy products.  Choose whole grains. Look for the word "whole" as the first word in the ingredient list.  Choose fish and skinless chicken or [place] more often than red meat. Limit fish, poultry, and meat to 6 oz (170 g) each day.  Limit sweets, desserts, sugars, and sugary drinks.  Choose heart-healthy fats.  Limit cheese to 1 oz (28 g) per day.  Eat more home-cooked food and less restaurant, buffet, and fast food.  Limit fried foods.  Cook foods using methods other than frying.  Limit canned vegetables. If you do use them, rinse them well to decrease the sodium.  When eating at a restaurant, ask that your food be prepared with less salt, or no salt if  possible. WHAT FOODS CAN I EAT? Seek help from a dietitian for individual calorie needs. Grains Whole grain or whole wheat bread. Brown rice. Whole grain or whole wheat pasta. Quinoa, bulgur, and whole grain cereals. Low-sodium cereals. Corn or whole wheat flour tortillas. Whole grain cornbread. Whole grain crackers. Low-sodium crackers. Vegetables Fresh or frozen vegetables (raw, steamed, roasted, or grilled). Low-sodium or reduced-sodium tomato and vegetable juices. Low-sodium or reduced-sodium tomato sauce and paste. Low-sodium or reduced-sodium canned  vegetables.  Fruits All fresh, canned (in natural juice), or frozen fruits. Meat and Other Protein Products Ground beef (85% or leaner), grass-fed beef, or beef trimmed of fat. Skinless chicken or [place]. Ground chicken or [place]. Pork trimmed of fat. All fish and seafood. Eggs. Dried beans, peas, or lentils. Unsalted nuts and seeds. Unsalted canned beans. Dairy Low-fat dairy products, such as skim or 1% milk, 2% or reduced-fat cheeses, low-fat ricotta or cottage cheese, or plain low-fat yogurt. Low-sodium or reduced-sodium cheeses. Fats and Oils Tub margarines without trans fats. Light or reduced-fat mayonnaise and salad dressings (reduced sodium). Avocado. Safflower, olive, or canola oils. Natural peanut or almond butter. Other Unsalted popcorn and pretzels. The items listed above may not be a complete list of recommended foods or beverages. Contact your dietitian for more options. WHAT FOODS ARE NOT RECOMMENDED? Grains White bread. White pasta. White rice. Refined cornbread. Bagels and croissants. Crackers that contain trans fat. Vegetables Creamed or fried vegetables. Vegetables in a cheese sauce. Regular canned vegetables. Regular canned tomato sauce and paste. Regular tomato and vegetable juices. Fruits Dried fruits. Canned fruit in light or heavy syrup. Fruit juice. Meat and Other Protein Products Fatty cuts of meat. Ribs, chicken wings, bacon, sausage, bologna, salami, chitterlings, fatback, hot dogs, bratwurst, and packaged luncheon meats. Salted nuts and seeds. Canned beans with salt. Dairy Whole or 2% milk, cream, half-and-half, and cream cheese. Whole-fat or sweetened yogurt. Full-fat cheeses or blue cheese. Nondairy creamers and whipped toppings. Processed cheese, cheese spreads, or cheese curds. Condiments Onion and garlic salt, seasoned salt, table salt, and sea salt. Canned and packaged gravies. Worcestershire sauce. Tartar sauce. Barbecue sauce. Teriyaki sauce. Soy sauce,  including reduced sodium. Steak sauce. Fish sauce. Oyster sauce. Cocktail sauce. Horseradish. Ketchup and mustard. Meat flavorings and tenderizers. Bouillon cubes. Hot sauce. Tabasco sauce. Marinades. Taco seasonings. Relishes. Fats and Oils Butter, stick margarine, lard, shortening, ghee, and bacon fat. Coconut, palm kernel, or palm oils. Regular salad dressings. Other Pickles and olives. Salted popcorn and pretzels. The items listed above may not be a complete list of foods and beverages to avoid. Contact your dietitian for more information. WHERE CAN I FIND MORE INFORMATION? National Heart, Lung, and [employer] Institute: [employer] Document Released: 04/08/2011 Document Revised: 09/03/2013 Document Reviewed: 02/21/2013 [facility] Patient Information 2015 [place], [place]. This information is not intended to replace advice given to you by your health care provider. Make sure you discuss any questions you have with your health care provider.  VAGINAL DRYNESS OVERVIEW  Vaginal dryness, also known as atrophic vaginitis, is a common condition in postmenopausal women. This condition is also common in women who have had both ovaries removed at the time of hysterectomy.   Some women have uncomfortable symptoms of vaginal dryness, such as pain with sex, burning vaginal discomfort or itching, or abnormal vaginal discharge, while others have no symptoms at all.  VAGINAL DRYNESS CAUSES   Estrogen helps to keep the vagina moist and to maintain thickness of the vaginal lining. Vaginal dryness occurs when the  ovaries produce a decreased amount of estrogen. This can occur at certain times in a woman's life, and may be permanent or temporary. Times when less estrogen is made include: ?At the time of menopause. ?After surgical removal of the ovaries, chemotherapy, or radiation therapy of the pelvis for cancer. ?After having a baby, particularly in women who breastfeed. ?While  using certain medications, such as danazol, medroxyprogesterone (brand names: Provera or DepoProvera), leuprolide (brand name: Lupron), or nafarelin. When these medications are stopped, estrogen production resumes.  Women who smoke cigarettes have been shown to have an increased risk of an earlier menopause transition as compared to non-smokers. Therefore, atrophic vaginitis symptoms may appear at a younger age in this population.  VAGINAL DRYNESS TREATMENT   There are three treatment options for women with vaginal dryness:  Vaginal lubricants and moisturizers - Vaginal lubricants and moisturizers can be purchased without a prescription. These products do not contain any hormones and have virtually no side effects. - Albolene is found in the facial cleanser section at [facility], [facility], or [employer]. It is a large jar with a blue top. This is the best lubricant for women because it is hypoallergenic. -Natural lubricants, such as olive, avocado or peanut oil, are easily available products that may be used as a lubricant with sex.  -Vaginal moisturizes (eg, Replens, Moist Again, Vagisil, K-Y Silk-E, and Feminease) are formulated to allow water to be retained in the vaginal tissues. Moisturizers are applied into the vagina three times weekly to allow a continued moisturizing effect. These should not be used just before having sex, as they can be irritating.  Vaginal estrogen - Vaginal estrogen is the most effective treatment option for women with vaginal dryness. Vaginal estrogen must be prescribed by a healthcare provider. Very low doses of vaginal estrogen can be used when it is put into the vagina to treat vaginal dryness. A small amount of estrogen is absorbed into the bloodstream, but only about 100 times less than when using estrogen pills or tablets. As a result, there is a much lower risk of side effects, such as blood clots, breast cancer, and heart attack, compared with other estrogen-containing  products (birth control pills, menopausal hormone therapy).   Ospemifene - Ospemifene is a prescription medication that is similar to estrogen, but is not estrogen. In the vaginal tissue, it acts similarly to estrogen. In the breast tissue, it acts as an estrogen blocker. It comes in a pill, and is prescribed for women who want to use an estrogen-like medication for vaginal dryness or painful sex associated with vaginal dryness, but prefer not to use a vaginal medication. The medication may cause hot flashes as a side effect. This type of medication may increase the risk of blood clots or uterine cancer. Further study of ospemifene is needed to evaluate the risk of these complications. This medication has not been tested in women who have had breast cancer or are at a high risk of developing breast cancer.    Sexual activity - Vaginal estrogen improves vaginal dryness quickly, usually within a few weeks. You may continue to have sex as you treat vaginal dryness because sex itself can help to keep the vaginal tissues healthy. Vaginal intercourse may help the vaginal tissues by keeping them soft and stretchable and preventing the tissues from shrinking.  If sex continues to be painful despite treatment for vaginal dryness, talk to your healthcare provider.    Check out  Mini habits for weight loss book  2 apps  for tracking food is myfitness pal  loseit OR can take picture of your food  8 Critical Weight-Loss Tips That Aren't Diet and Exercise  1. STARVE THE DISTRACTIONS  All too often when we eat, we're also multitasking: watching TV, answering emails, scrolling through social media. These habits are detrimental to having a strong, clear, healthy relationship with food, and they can hinder our ability to make dietary changes.  In order to truly focus on what you're eating, how much you're eating, why you're eating those specific foods and, most importantly, how those foods make you feel, you  need to starve the distractions. That means when you eat, just eat. Focus on your food, the process it went through to end up on your plate, where it came from and how it nourishes you. With this technique, you're more likely to finish a meal feeling satiated.  2.  CONSIDER WHAT YOU'RE NOT WILLING TO DO  This might sound counterintuitive, but it can help provide a "why" when motivation is waning. Declare, in writing, what you are unwilling to do, for example "I am unwilling to be the old dad who cannot play sports with my children".  So consider what you're not willing to accept, write it down, and keep it at the ready.  3.  STOP LABELING FOOD "GOOD" AND "BAD"  You've probably heard someone say they ate something "bad." Maybe you've even said it yourself.  The trouble with 'bad' foods isn't that they'll send you to the grave after a bite or two. The trouble comes when we eat excessive portions of really calorie-dense foods meal after meal, day after day.  Instead of labeling foods as good or bad, think about which foods you can eat a lot of, and which ones you should just eat a little of. Then, plan ways to eat the foods you really like in portions that fit with your overall goals. A good example of this would be having a slice of pizza alongside a club salad with chicken breast, avocado and a bit of dressing. This is vastly different than 3 slices of pizza, 4 breadsticks with cheese sauce and half of a liter of regular soda.  4.  BRUSH YOUR TEETH AFTER YOU EAT  Getting your mindset in order is important, but sometimes small habits can make a big difference. After eating, you still have the taste of food in their mouth, which often causes people to eat more even if they are full or engage in a nibble or two of dessert.  Brushing your teeth will remove the taste of food from your mouth, and the clean, minty freshness will serve as a cue that mealtime is over.  5.  FOCUS ON CROWDING NOT  CUTTING  The most common first step during 'dieting' is to cut. We cut our portion sizes down, we cut out 'bad' foods, we cut out entire food groups. This act of cutting puts us and our minds into scarcity mode.  When something is off-limits, even if you're able to avoid it for a while, you could end up bingeing on it later because you've gone so long without it. So, instead of cutting, focus on crowding. If you crowd your plate and fill it up with more foods like veggies and protein, it simply allows less room for the other stuff. In other words, shift your focus away from what you can't eat, and celebrate the foods that will help you reach your goals.  6.  TAKE  TRACKING A STEP FURTHER  Track what you eat, when you ate it, how much you ate and how that food made you feel. Being completely honest with yourself and writing down every single thing that passes through your lips will help you start to notice that maybe you actually do snack, possibly take in more sugar than you thought, eat when you're bored rather than just hungry or maybe that you have a habit of snacking before bed while watching TV.  The difference from simply tracking your food intake is you're taking into account how food makes you feel, as well as what you're doing while you're eating. This is about becoming more mindful of what, when and why you eat.  7.  PRIORITIZE GOOD SLEEP  One of the strongest risk factors for being overweight is poor sleep. When you're feeling tired, you're more likely to choose unhealthy comfort foods and to skip your workout. Additionally, sleep deprivation may slow down your metabolism. Burnett KanarisYikes! Therefore, sleeping 7-8 hours per night can help with weight loss without having to change your diet or increase your physical activity. And if you feel you snore and still wake up tired, talk with me about sleep apnea.  8.  SET ASIDE TIME TO DISCONNECT  Just get out there. Disconnect from the electronics and  connect to the elements. Not only will this help reduce stress (a major factor in weight gain) by giving your mind a break from the constant stimulation we've all become so accustomed to, but it may also reprogram your brain to connect with yourself and what you're feeling.

## 2017-12-21 LAB — URINALYSIS, ROUTINE W REFLEX MICROSCOPIC
BILIRUBIN URINE: NEGATIVE
GLUCOSE, UA: NEGATIVE
HGB URINE DIPSTICK: NEGATIVE
KETONES UR: NEGATIVE
Leukocytes, UA: NEGATIVE
Nitrite: NEGATIVE
PH: 5.5 (ref 5.0–8.0)
Protein, ur: NEGATIVE
Specific Gravity, Urine: 1.016 (ref 1.001–1.03)

## 2017-12-21 LAB — COMPLETE METABOLIC PANEL WITH GFR
AG RATIO: 1.5 (calc) (ref 1.0–2.5)
ALBUMIN MSPROF: 4.3 g/dL (ref 3.6–5.1)
ALT: 12 U/L (ref 6–29)
AST: 13 U/L (ref 10–35)
Alkaline phosphatase (APISO): 77 U/L (ref 33–130)
BUN / CREAT RATIO: 9 (calc) (ref 6–22)
BUN: 10 mg/dL (ref 7–25)
CALCIUM: 9.5 mg/dL (ref 8.6–10.4)
CHLORIDE: 103 mmol/L (ref 98–110)
CO2: 28 mmol/L (ref 20–32)
Creat: 1.12 mg/dL — ABNORMAL HIGH (ref 0.50–1.05)
GFR, EST AFRICAN AMERICAN: 64 mL/min/{1.73_m2} (ref >=60)
GFR, EST NON AFRICAN AMERICAN: 55 mL/min/{1.73_m2} — AB (ref >=60)
Globulin: 2.9 g/dL (calc) (ref 1.9–3.7)
Glucose, Bld: 89 mg/dL (ref 65–99)
Potassium: 4 mmol/L (ref 3.5–5.3)
Sodium: 141 mmol/L (ref 135–146)
Total Bilirubin: 0.4 mg/dL (ref 0.2–1.2)
Total Protein: 7.2 g/dL (ref 6.1–8.1)

## 2017-12-21 LAB — CBC WITH DIFFERENTIAL/PLATELET
Basophils Absolute: 53 cells/uL (ref 0–200)
Basophils Relative: 0.9 %
EOS ABS: 330 {cells}/uL (ref 15–500)
Eosinophils Relative: 5.6 %
HCT: 39.6 % (ref 35.0–45.0)
HEMOGLOBIN: 13.4 g/dL (ref 11.7–15.5)
LYMPHS ABS: 2496 {cells}/uL (ref 850–3900)
MCH: 28 pg (ref 27.0–33.0)
MCHC: 33.8 g/dL (ref 32.0–36.0)
MCV: 82.7 fL (ref 80.0–100.0)
MPV: 10.8 fL (ref 7.5–12.5)
Monocytes Relative: 6.5 %
NEUTROS ABS: 2637 {cells}/uL (ref 1500–7800)
Neutrophils Relative %: 44.7 %
Platelets: 314 10*3/uL (ref 140–400)
RBC: 4.79 10*6/uL (ref 3.80–5.10)
RDW: 13.7 % (ref 11.0–15.0)
Total Lymphocyte: 42.3 %
WBC: 5.9 10*3/uL (ref 3.8–10.8)
WBCMIX: 384 {cells}/uL (ref 200–950)

## 2017-12-21 LAB — MICROALBUMIN / CREATININE URINE RATIO
Creatinine, Urine: 126 mg/dL (ref 20–275)
Microalb Creat Ratio: 2 mcg/mg creat (ref <30)
Microalb, Ur: 0.2 mg/dL

## 2017-12-21 LAB — TSH: TSH: 0.42 mIU/L

## 2017-12-21 LAB — HEMOGLOBIN A1C
HEMOGLOBIN A1C: 6.2 %{Hb} — AB (ref <5.7)
Mean Plasma Glucose: 131 (calc)
eAG (mmol/L): 7.3 (calc)

## 2017-12-21 LAB — VITAMIN D 25 HYDROXY (VIT D DEFICIENCY, FRACTURES): Vit D, 25-Hydroxy: 24 ng/mL — ABNORMAL LOW (ref 30–100)

## 2017-12-21 LAB — LIPID PANEL
Cholesterol: 241 mg/dL — ABNORMAL HIGH (ref <200)
HDL: 55 mg/dL (ref >50)
LDL Cholesterol (Calc): 164 mg/dL (calc) — ABNORMAL HIGH
NON-HDL CHOLESTEROL (CALC): 186 mg/dL — AB (ref <130)
Total CHOL/HDL Ratio: 4.4 (calc) (ref <5.0)
Triglycerides: 107 mg/dL (ref <150)

## 2017-12-21 LAB — MAGNESIUM: MAGNESIUM: 1.8 mg/dL (ref 1.5–2.5)

## 2017-12-28 LAB — CYTOLOGY - PAP
DIAGNOSIS: UNDETERMINED — AB
HPV 16/18/45 genotyping: NEGATIVE
HPV: DETECTED — AB

## 2018-03-27 ENCOUNTER — Other Ambulatory Visit: Payer: Self-pay | Admitting: Physician Assistant

## 2018-04-17 ENCOUNTER — Ambulatory Visit: Payer: BC Managed Care – PPO | Admitting: Physician Assistant

## 2018-04-17 ENCOUNTER — Encounter: Payer: Self-pay | Admitting: Physician Assistant

## 2018-04-17 VITALS — BP 130/70 | HR 100 | Temp 98.0°F | Ht 64.0 in | Wt 246.0 lb

## 2018-04-17 DIAGNOSIS — E041 Nontoxic single thyroid nodule: Secondary | ICD-10-CM

## 2018-04-17 DIAGNOSIS — E0590 Thyrotoxicosis, unspecified without thyrotoxic crisis or storm: Secondary | ICD-10-CM

## 2018-04-17 DIAGNOSIS — R7303 Prediabetes: Secondary | ICD-10-CM | POA: Diagnosis not present

## 2018-04-17 DIAGNOSIS — I10 Essential (primary) hypertension: Secondary | ICD-10-CM

## 2018-04-17 DIAGNOSIS — E785 Hyperlipidemia, unspecified: Secondary | ICD-10-CM | POA: Diagnosis not present

## 2018-04-17 DIAGNOSIS — Z79899 Other long term (current) drug therapy: Secondary | ICD-10-CM

## 2018-04-17 DIAGNOSIS — F3341 Major depressive disorder, recurrent, in partial remission: Secondary | ICD-10-CM

## 2018-04-17 NOTE — Patient Instructions (Addendum)
WATER IS IMPORTANT  Being dehydrated can hurt your kidneys, cause fatigue, headaches, muscle aches, joint pain, and dry skin/nails so please increase your fluids.   Drink 80-100 oz a day of water, measure it out! Eat 3 meals a day, have to do breakfast, eat protein- hard boiled eggs, protein bar like [place] protein bar, greek yogurt like oikos triple zero, chobani 100, or light n fit greek  Can check out plantnanny app on your phone to help you keep track of your water    Check out  Mini habits for weight loss book  2 apps for tracking food is myfitness pal  loseit OR can take picture of your food   Google mindful eating and here are some tips and tricks below.   Rate your hunger before you eat on a scale of 1-10, try to eat closer to a 6 or higher. And if you are at below that, why are you eating? Slow down and listen to your body.          When it comes to diets, agreement about the perfect plan isn't easy to find, even among the experts. Experts at the [employer] developed an idea known as the Healthy Eating Plate. Just imagine a plate divided into logical, healthy portions.  The emphasis is on diet quality:  Load up on vegetables and fruits - one-half of your plate: Aim for color and variety, and remember that potatoes don't count.  Go for whole grains - one-quarter of your plate: Whole wheat, barley, wheat berries, quinoa, oats, brown rice, and foods made with them. If you want pasta, go with whole wheat pasta.  Protein power - one-quarter of your plate: Fish, chicken, beans, and nuts are all healthy, versatile protein sources. Limit red meat.  The diet, however, does go beyond the plate, offering a few other suggestions.  Use healthy plant oils, such as olive, canola, soy, corn, sunflower and peanut. Check the labels, and avoid partially hydrogenated oil, which have unhealthy trans fats.  If you're thirsty, drink water. Coffee and tea are  good in moderation, but skip sugary drinks and limit milk and dairy products to one or two daily servings.  The type of carbohydrate in the diet is more important than the amount. Some sources of carbohydrates, such as vegetables, fruits, whole grains, and beans-are healthier than others.  Finally, stay active.  

## 2018-04-17 NOTE — Progress Notes (Signed)
3 month follow up  Assessment and Plan: Essential hypertension - continue medications, DASH diet, exercise and monitor at home. Call if greater than 130/80.  - CBC with Differential/Platelet - BASIC METABOLIC PANEL WITH GFR - Hepatic function panel - TSH  Prediabetes Discussed general issues about diabetes pathophysiology and management., Educational material distributed., Suggested low cholesterol diet., Encouraged aerobic exercise., Discussed foot care., Reminded to get yearly retinal exam. - Hemoglobin A1c - Insulin, fasting  Hyperlipidemia -continue medications, check lipids, decrease fatty foods, increase activity.  - Lipid panel  Morbid Obesity - follow up 4 months for progress monitoring - increase veggies, decrease carbs - long discussion about weight loss, diet, and exercise  Medication management - Magnesium  Hyperthyroidism Monitor    Discussed med's effects and SE's. Screening labs and tests as requested with regular follow-up as recommended.  Future Appointments  Date Time Provider Department Center  12/25/2018  3:00 PM Quentin Mulling, PA-C [facility] None     HPI 57 y.o. female  presents for follow up for HTN, chol, hyperthyroidism, preDM, morbid obesity.   BMI is Body mass index is 42.23 kg/m., she is not working on diet and exercise. She has been drinking sodas, working A LOT and not eating dinner/snacking more.  Wt Readings from Last 3 Encounters:  04/17/18 246 lb (111.6 kg)  12/20/17 244 lb 6.4 oz (110.9 kg)  08/18/17 244 lb 9.6 oz (110.9 kg)   Her blood pressure has been controlled at home, today their BP is BP: 130/70 She does not workout.  She denies chest pain, dizziness.  She is on cholesterol medication, simvastatin 40mg  and denies myalgias. Her cholesterol is at goal. The cholesterol last visit was:   Lab Results  Component Value Date   CHOL 241 (H) 12/20/2017   HDL 55 12/20/2017   LDLCALC 164 (H) 12/20/2017   TRIG 107 12/20/2017   CHOLHDL 4.4 12/20/2017   She has been working on diet and exercise for prediabetes, and denies paresthesia of the feet, polydipsia and polyuria. She is on bASA. Last A1C in the office was:  Lab Results  Component Value Date   HGBA1C 6.2 (H) 12/20/2017   Patient is on Vitamin D supplement, 5000 daily.  Lab Results  Component Value Date   VD25OH 24 (L) 12/20/2017   She has had hyperthyroidism, she has had normal RAI uptake and negative thyroid BX.  Lab Results  Component Value Date   TSH 0.42 12/20/2017   She has been off estrogen x 2 years.   Current Medications:  Current Outpatient Medications on File Prior to Visit  Medication Sig  . albuterol (PROAIR HFA) 108 (90 Base) MCG/ACT inhaler Inhale 2 puffs into the lungs every 6 (six) hours as needed for wheezing or shortness of breath.  Marland Kitchen aspirin 81 MG tablet Take 81 mg by mouth daily.  Marland Kitchen azelastine (ASTELIN) 137 MCG/SPRAY nasal spray Place 2 sprays into both nostrils 2 (two) times daily. Use in each nostril as directed  . Calcium-Magnesium-Zinc 167-83-8 MG TABS Take 1 tablet by mouth daily.  . cetirizine (ZYRTEC) 10 MG tablet Take 10 mg by mouth daily.  . cholecalciferol (VITAMIN D) 1000 UNITS tablet Take 5,000 Units by mouth daily.  . enalapril (VASOTEC) 5 MG tablet Take 1 tablet (5 mg total) by mouth daily.  Marland Kitchen ezetimibe (ZETIA) 10 MG tablet TAKE 1 TABLET BY MOUTH EVERY DAY  . hydrochlorothiazide (HYDRODIURIL) 25 MG tablet Take 1 tablet (25 mg total) by mouth daily.  . simvastatin (ZOCOR) 40  MG tablet Take 1 tablet (40 mg total) by mouth daily.  Marland Kitchen. triamcinolone cream (KENALOG) 0.1 % Apply 1 application topically 2 (two) times daily.   No current facility-administered medications on file prior to visit.     Problem list She has Hypertension; Hyperlipidemia; Depression; Migraines; Vitamin D deficiency; Prediabetes; Morbid obesity (HCC); Medication management; Varicose veins of both lower extremities; Hyperthyroidism; Thyroid nodule;  and Anemia on their problem list.   A SURGICAL HISTORY She  has a past surgical history that includes Abdominal hysterectomy and Stapedectomy (Right, 2000). FAMILY HISTORY Her family history includes Arthritis in her mother; Asthma in her mother; Cancer (age of onset: 2550) in her maternal grandfather; Cancer (age of onset: 6172) in her maternal grandmother; Diabetes in her daughter and father; Hyperlipidemia in her father; Hypertension in her father, mother, and sister. SOCIAL HISTORY She  reports that she has never smoked. She has never used smokeless tobacco. She reports that she does not drink alcohol or use drugs.  Review of Systems  Constitutional: Negative.   HENT: Negative.   Eyes: Negative.   Respiratory: Negative.   Cardiovascular: Negative.   Gastrointestinal: Negative.   Genitourinary: Negative.   Musculoskeletal: Negative.   Skin: Negative.   Neurological: Negative.   Endo/Heme/Allergies: Negative.   Psychiatric/Behavioral: Negative.     Physical Exam: Estimated body mass index is 42.23 kg/m as calculated from the following:   Height as of this encounter: 5\' 4"  (1.626 m).   Weight as of this encounter: 246 lb (111.6 kg). BP 130/70   Pulse 100   Temp 98 F (36.7 C)   Ht 5\' 4"  (1.626 m)   Wt 246 lb (111.6 kg)   SpO2 98%   BMI 42.23 kg/m  Wt Readings from Last 3 Encounters:  04/17/18 246 lb (111.6 kg)  12/20/17 244 lb 6.4 oz (110.9 kg)  08/18/17 244 lb 9.6 oz (110.9 kg)   General Appearance: Well nourished, in no apparent distress. Eyes: PERRLA, EOMs, conjunctiva no swelling or erythema, normal fundi and vessels. Sinuses: No Frontal/maxillary tenderness ENT/Mouth: Ext aud canals clear, normal light reflex with TMs without erythema, bulging.  Good dentition. No erythema, swelling, or exudate on post pharynx. Tonsils not swollen or erythematous. Hearing normal.  Neck: Supple, thyroid normal. No bruits Respiratory: Respiratory effort normal, BS equal bilaterally  without rales, rhonchi, wheezing or stridor. Cardio: RRR with systolic murmur RSB without rubs or gallops. Brisk peripheral pulses without edema.  Chest: symmetric, with normal excursions and percussion. Abdomen: Soft, +BS. Non tender, no guarding, rebound, hernias, masses, or organomegaly. .  Lymphatics: Non tender without lymphadenopathy.  Musculoskeletal: Full ROM all peripheral extremities,5/5 strength, and normal gait. Skin: Warm, dry without rashes, lesions, ecchymosis. Neuro: Cranial nerves intact, reflexes equal bilaterally. Normal muscle tone, no cerebellar symptoms. Sensation intact.  Psych: Awake and oriented X 3, normal affect, Insight and Judgment appropriate.    Quentin MullingAmanda Landa Mullinax 2:39 PM

## 2018-04-18 LAB — CBC WITH DIFFERENTIAL/PLATELET
Absolute Monocytes: 514 cells/uL (ref 200–950)
Basophils Absolute: 33 cells/uL (ref 0–200)
Basophils Relative: 0.5 %
EOS PCT: 5.4 %
Eosinophils Absolute: 351 cells/uL (ref 15–500)
HCT: 39 % (ref 35.0–45.0)
Hemoglobin: 12.8 g/dL (ref 11.7–15.5)
Lymphs Abs: 2236 cells/uL (ref 850–3900)
MCH: 27.5 pg (ref 27.0–33.0)
MCHC: 32.8 g/dL (ref 32.0–36.0)
MCV: 83.9 fL (ref 80.0–100.0)
MPV: 10.7 fL (ref 7.5–12.5)
Monocytes Relative: 7.9 %
Neutro Abs: 3367 cells/uL (ref 1500–7800)
Neutrophils Relative %: 51.8 %
PLATELETS: 338 10*3/uL (ref 140–400)
RBC: 4.65 10*6/uL (ref 3.80–5.10)
RDW: 13.5 % (ref 11.0–15.0)
Total Lymphocyte: 34.4 %
WBC: 6.5 10*3/uL (ref 3.8–10.8)

## 2018-04-18 LAB — COMPLETE METABOLIC PANEL WITH GFR
AG Ratio: 1.4 (calc) (ref 1.0–2.5)
ALT: 10 U/L (ref 6–29)
AST: 12 U/L (ref 10–35)
Albumin: 4.2 g/dL (ref 3.6–5.1)
Alkaline phosphatase (APISO): 73 U/L (ref 33–130)
BUN: 14 mg/dL (ref 7–25)
CO2: 27 mmol/L (ref 20–32)
Calcium: 9.6 mg/dL (ref 8.6–10.4)
Chloride: 108 mmol/L (ref 98–110)
Creat: 0.94 mg/dL (ref 0.50–1.05)
GFR, Est African American: 79 mL/min/{1.73_m2} (ref >=60)
GFR, Est Non African American: 68 mL/min/{1.73_m2} (ref >=60)
Globulin: 2.9 g/dL (calc) (ref 1.9–3.7)
Glucose, Bld: 112 mg/dL — ABNORMAL HIGH (ref 65–99)
Potassium: 4.1 mmol/L (ref 3.5–5.3)
Sodium: 142 mmol/L (ref 135–146)
Total Bilirubin: 0.3 mg/dL (ref 0.2–1.2)
Total Protein: 7.1 g/dL (ref 6.1–8.1)

## 2018-04-18 LAB — LIPID PANEL
CHOLESTEROL: 224 mg/dL — AB (ref <200)
HDL: 50 mg/dL — ABNORMAL LOW (ref >50)
LDL Cholesterol (Calc): 148 mg/dL (calc) — ABNORMAL HIGH
Non-HDL Cholesterol (Calc): 174 mg/dL (calc) — ABNORMAL HIGH (ref <130)
Total CHOL/HDL Ratio: 4.5 (calc) (ref <5.0)
Triglycerides: 135 mg/dL (ref <150)

## 2018-04-18 LAB — HEMOGLOBIN A1C
Hgb A1c MFr Bld: 6 % of total Hgb — ABNORMAL HIGH (ref <5.7)
Mean Plasma Glucose: 126 (calc)
eAG (mmol/L): 7 (calc)

## 2018-04-18 LAB — TSH: TSH: 0.29 mIU/L — ABNORMAL LOW (ref 0.40–4.50)

## 2018-04-18 LAB — MAGNESIUM: MAGNESIUM: 1.8 mg/dL (ref 1.5–2.5)

## 2018-04-24 ENCOUNTER — Other Ambulatory Visit: Payer: Self-pay | Admitting: Physician Assistant

## 2018-08-02 ENCOUNTER — Other Ambulatory Visit: Payer: Self-pay | Admitting: Internal Medicine

## 2018-08-02 DIAGNOSIS — E785 Hyperlipidemia, unspecified: Secondary | ICD-10-CM

## 2018-08-16 NOTE — Progress Notes (Signed)
3 month follow up  Assessment and Plan: Essential hypertension - continue medications, DASH diet, exercise and monitor at home. Call if greater than 130/80.  - CBC with Differential/Platelet - BASIC METABOLIC PANEL WITH GFR - Hepatic function panel - TSH  Abnormal glucose Discussed general issues about diabetes pathophysiology and management., Educational material distributed., Suggested low cholesterol diet., Encouraged aerobic exercise., Discussed foot care., Reminded to get yearly retinal exam. - Hemoglobin A1c  Hyperlipidemia -continue medications, check lipids, decrease fatty foods, increase activity.  - Lipid panel  Morbid Obesity - follow up 4 months for progress monitoring - increase veggies, decrease carbs - long discussion about weight loss, diet, and exercise  Medication management - Magnesium  Hyperthyroidism Monitor  Anemia, unspecified type -     Iron,Total/Total Iron Binding Cap  Vitamin D deficiency -     VITAMIN D 25 Hydroxy (Vit-D Deficiency, Fractures)  Acute pain of right shoulder Bicep tendonitis versus rotator cuff tendonitis with impingement -     meloxicam (MOBIC) 15 MG tablet; Take one daily with food for 2 weeks, can take with tylenol, can not take with aleve, iburpofen, then as needed daily for pain  exercises given, RICE, NSAIDS, if not better PT or ortho referral      Discussed med's effects and SE's. Screening labs and tests as requested with regular follow-up as recommended.  Future Appointments  Date Time Provider Department Center  12/25/2018  3:00 PM Quentin Mullingollier, Amanda, PA-C [facility] None     HPI 58 y.o. female  presents for follow up for HTN, chol, hyperthyroidism, preDM, morbid obesity.   She has right shoulder stiffness x 1 month.  She removed some boxes from [place] shelf [place] and had some stiffness then but has gotten worse x 1 month. She is on aleve and biofreeze. She states she has been trying exercises at home. She    BMI is Body mass index is 41.88 kg/m., she is not working on diet and exercise. She has been drinking sodas, working A LOT and not eating dinner/snacking more.  Wt Readings from Last 3 Encounters:  08/17/18 244 lb (110.7 kg)  04/17/18 246 lb (111.6 kg)  12/20/17 244 lb 6.4 oz (110.9 kg)   Her blood pressure has been controlled at home, she is on enalapril and has been taking HCTZ 1 pill, today their BP is BP: 132/76 She does not workout.  She denies chest pain, dizziness.  She is on cholesterol medication, simvastatin 40 and zetia 10 and denies myalgias. Her cholesterol is at goal. The cholesterol last visit was:   Lab Results  Component Value Date   CHOL 224 (H) 04/17/2018   HDL 50 (L) 04/17/2018   LDLCALC 148 (H) 04/17/2018   TRIG 135 04/17/2018   CHOLHDL 4.5 04/17/2018   She has been working on diet and exercise for prediabetes, and denies paresthesia of the feet, polydipsia and polyuria. She is on bASA. Last A1C in the office was:  Lab Results  Component Value Date   HGBA1C 6.0 (H) 04/17/2018   Patient is on Vitamin D supplement, 5000 daily.  Lab Results  Component Value Date   VD25OH 24 (L) 12/20/2017   She has had hyperthyroidism, she has had normal RAI uptake and negative thyroid BX.  Lab Results  Component Value Date   TSH 0.29 (L) 04/17/2018   Current Medications:  Current Outpatient Medications on File Prior to Visit  Medication Sig  . albuterol (PROAIR HFA) 108 (90 Base) MCG/ACT inhaler Inhale  2 puffs into the lungs every 6 (six) hours as needed for wheezing or shortness of breath.  Marland Kitchen aspirin 81 MG tablet Take 81 mg by mouth daily.  Marland Kitchen azelastine (ASTELIN) 137 MCG/SPRAY nasal spray Place 2 sprays into both nostrils 2 (two) times daily. Use in each nostril as directed  . cetirizine (ZYRTEC) 10 MG tablet Take 10 mg by mouth daily.  . cholecalciferol (VITAMIN D) 1000 UNITS tablet Take 5,000 Units by mouth daily.  . enalapril (VASOTEC) 5 MG tablet TAKE 1 TABLET BY  MOUTH EVERY DAY  . ezetimibe (ZETIA) 10 MG tablet TAKE 1 TABLET BY MOUTH EVERY DAY  . simvastatin (ZOCOR) 40 MG tablet TAKE 1 TABLET BY MOUTH EVERY DAY  . triamcinolone cream (KENALOG) 0.1 % Apply 1 application topically 2 (two) times daily.   No current facility-administered medications on file prior to visit.     Problem list She has Hypertension; Hyperlipidemia; Depression; Migraines; Vitamin D deficiency; Abnormal glucose; Morbid obesity (HCC); Medication management; Varicose veins of both lower extremities; Hyperthyroidism; Thyroid nodule; and Anemia on their problem list.   A SURGICAL HISTORY She  has a past surgical history that includes Abdominal hysterectomy and Stapedectomy (Right, 2000). FAMILY HISTORY Her family history includes Arthritis in her mother; Asthma in her mother; Cancer (age of onset: 63) in her maternal grandfather; Cancer (age of onset: 22) in her maternal grandmother; Diabetes in her daughter and father; Hyperlipidemia in her father; Hypertension in her father, mother, and sister. SOCIAL HISTORY She  reports that she has never smoked. She has never used smokeless tobacco. She reports that she does not drink alcohol or use drugs.  Review of Systems  Constitutional: Negative.   HENT: Negative.   Eyes: Negative.   Respiratory: Negative.   Cardiovascular: Negative.   Gastrointestinal: Negative.   Genitourinary: Negative.   Musculoskeletal: Positive for joint pain. Negative for back pain, falls, myalgias and neck pain.  Skin: Negative.   Neurological: Negative.   Endo/Heme/Allergies: Negative.   Psychiatric/Behavioral: Negative.     Physical Exam: Estimated body mass index is 41.88 kg/m as calculated from the following:   Height as of this encounter:  (1.626 m).   Weight as of this encounter: 244 lb (110.7 kg). BP 132/76   Pulse (!) 104   Temp (!) 97.1 F (36.2 C)   Ht  (1.626 m)   Wt 244 lb (110.7 kg)   SpO2 98%   BMI 41.88 kg/m  Wt  Readings from Last 3 Encounters:  08/17/18 244 lb (110.7 kg)  04/17/18 246 lb (111.6 kg)  12/20/17 244 lb 6.4 oz (110.9 kg)   General Appearance: Well nourished, in no apparent distress. Eyes: PERRLA, EOMs, conjunctiva no swelling or erythema, normal fundi and vessels. Sinuses: No Frontal/maxillary tenderness ENT/Mouth: Ext aud canals clear, normal light reflex with TMs without erythema, bulging.  Good dentition. No erythema, swelling, or exudate on post pharynx. Tonsils not swollen or erythematous. Hearing normal.  Neck: Supple, thyroid normal. No bruits Respiratory: Respiratory effort normal, BS equal bilaterally without rales, rhonchi, wheezing or stridor. Cardio: RRR with systolic murmur RSB without rubs or gallops. Brisk peripheral pulses without edema.  Chest: symmetric, with normal excursions and percussion. Abdomen: Soft, +BS. Non tender, no guarding, rebound, hernias, masses, or organomegaly. Lymphatics: Non tender without lymphadenopathy.  Musculoskeletal: Full ROM all peripheral extremities,5/5 strength, and normal gait. Shoulder right:  Neurovascularly intact distally. Good strength with stress of rotator cuff but causes pain, pain with abduction to 80 degrees,  pain with internal and external rotation. Positive impingement signs. Negative empty can test.  Skin: Warm, dry without rashes, lesions, ecchymosis. Neuro: Cranial nerves intact, reflexes equal bilaterally. Normal muscle tone, no cerebellar symptoms. Sensation intact.  Psych: Awake and oriented X 3, normal affect, Insight and Judgment appropriate.    Quentin Mulling 12:42 PM

## 2018-08-17 ENCOUNTER — Other Ambulatory Visit: Payer: Self-pay

## 2018-08-17 ENCOUNTER — Ambulatory Visit: Payer: BC Managed Care – PPO | Admitting: Physician Assistant

## 2018-08-17 ENCOUNTER — Encounter: Payer: Self-pay | Admitting: Physician Assistant

## 2018-08-17 ENCOUNTER — Ambulatory Visit: Payer: Self-pay | Admitting: Physician Assistant

## 2018-08-17 VITALS — BP 132/76 | HR 104 | Temp 97.1°F | Ht 64.0 in | Wt 244.0 lb

## 2018-08-17 DIAGNOSIS — Z79899 Other long term (current) drug therapy: Secondary | ICD-10-CM

## 2018-08-17 DIAGNOSIS — F3341 Major depressive disorder, recurrent, in partial remission: Secondary | ICD-10-CM | POA: Diagnosis not present

## 2018-08-17 DIAGNOSIS — I10 Essential (primary) hypertension: Secondary | ICD-10-CM | POA: Diagnosis not present

## 2018-08-17 DIAGNOSIS — E785 Hyperlipidemia, unspecified: Secondary | ICD-10-CM | POA: Diagnosis not present

## 2018-08-17 DIAGNOSIS — R7309 Other abnormal glucose: Secondary | ICD-10-CM

## 2018-08-17 DIAGNOSIS — R7303 Prediabetes: Secondary | ICD-10-CM

## 2018-08-17 DIAGNOSIS — D649 Anemia, unspecified: Secondary | ICD-10-CM

## 2018-08-17 DIAGNOSIS — E559 Vitamin D deficiency, unspecified: Secondary | ICD-10-CM

## 2018-08-17 DIAGNOSIS — M25511 Pain in right shoulder: Secondary | ICD-10-CM

## 2018-08-17 DIAGNOSIS — E0590 Thyrotoxicosis, unspecified without thyrotoxic crisis or storm: Secondary | ICD-10-CM | POA: Diagnosis not present

## 2018-08-17 MED ORDER — MELOXICAM 15 MG PO TABS: ORAL_TABLET | ORAL | 1 refills | Status: DC

## 2018-08-17 MED ORDER — HYDROCHLOROTHIAZIDE 25 MG PO TABS: 25.0000 mg | ORAL_TABLET | Freq: Every day | ORAL | 1 refills | Status: DC

## 2018-08-17 NOTE — Patient Instructions (Signed)
If not better in 1 month will refer to ortho for possible injection Versus PT   Biceps Tendon Tendinitis (Proximal) and Tenosynovitis  The proximal biceps tendon is a strong cord of tissue that connects the biceps muscle, on the front of the upper arm, to the shoulder blade. Tendinitis is inflammation of a tendon. Tenosynovitis is inflammation of the lining around the tendon (tendon sheath). These conditions often occur at the same time, and they can interfere with the ability to bend the elbow and turn the hand palm-up (supination). Proximal biceps tendon tendinitis and tenosynovitis are usually caused by overusing the shoulder joint and the biceps muscle. These conditions usually heal within 6 weeks. Proximal biceps tendon tendinitis may include a grade 1 or grade 2 strain of the tendon. A grade 1 strain is mild, and it involves a slight pull of the tendon without any stretching or noticeable tearing of the tendon. There is usually no loss of biceps muscle strength. A grade 2 strain is moderate, and it involves a small tear in the tendon. The tendon is stretched, and biceps strength is usually decreased. What are the causes? This condition may be caused by:  A sudden increase in frequency or intensity of activity that involves the shoulder and the biceps muscle.  Overuse of the biceps muscle. This can happen when you do the same movements over and over, such as: ? Supination. ? Forceful straightening (hyperextension) of the elbow. ? Bending the elbow.  A direct, forceful hit or injury (trauma) to the elbow. This is rare. What increases the risk? The following factors may make you more likely to develop this condition:  Playing contact sports.  Playing sports that involve throwing and overhead movements, including racket sports, gymnastics, weight lifting, or bodybuilding.  Doing physical labor.  Having poor strength and flexibility of the arm and shoulder. What are the signs or  symptoms? Symptoms of this condition may include:  Pain and inflammation in the front of the shoulder. Pain may get worse with movement, especially when you use resistance, as in weight lifting.  A feeling of warmth in the front of the shoulder.  Limited range of motion of the shoulder and the elbow.  A crackling sound (crepitation) when you move or touch the shoulder or the upper arm. In some cases, symptoms may return (recur) after treatment, and they may be long-lasting (chronic). How is this diagnosed? This condition is diagnosed based on your symptoms, your medical history, and a physical exam. You may have tests, including X-rays or MRIs. Your health care provider may test your range of motion by asking you to do arm movements. How is this treated? This condition is treated by resting and icing the injured area, and by doing physical therapy exercises. Depending on the severity of your condition, treatment may also include:  Medicines to help relieve pain and inflammation.  Ultrasound therapy. This is the application of sound waves to the injured area.  Injecting medicines (corticosteroids) into your tendon sheath.  Injecting medicines that numb the area (local anesthetics).  Surgery to remove the damaged part of the tendon and reattach the undamaged part of the tendon to the arm bone (humerus). This is usually only done if you have symptoms that do not get better with other treatment methods. Follow these instructions at home: Managing pain, stiffness, and swelling   If directed, put ice on the injured area: ? Put ice in a plastic bag. ? Place a towel between your skin and  the bag. ? Leave the ice on for 20 minutes, 2-3 times a day.  Move your fingers often to avoid stiffness and to lessen swelling.  Raise (elevate) the injured area above the level of your heart while you are sitting or lying down.  If directed, apply heat to the affected area before you exercise. Use the  heat source that your health care provider recommends, such as a moist heat pack or a heating pad. ? Place a towel between your skin and the heat source. ? Leave the heat on for 20-30 minutes. ? Remove the heat if your skin turns bright red. This is especially important if you are unable to feel pain, heat, or cold. You may have a greater risk of getting burned. Activity  Return to your normal activities as told by your health care provider. Ask your health care provider what activities are safe for you.  Do not lift anything that is heavier than 10 lb (4.5 kg) until your health care provider tells you that it is safe.  Avoid activities that cause pain or make your condition worse.  Do exercises as told by your health care provider. General instructions  Take over-the-counter and prescription medicines only as told by your health care provider.  Do not drive or operate heavy machinery while taking prescription pain medicines.  Keep all follow-up visits as told by your health care provider. This is important. How is this prevented?  Warm up and stretch before being active.  Cool down and stretch after being active.  Give your body time to rest between periods of activity.  Make sure any equipment that you use is fitted to you.  Be safe and responsible while being active to avoid falls.  Do at least 150 minutes of moderate-intensity aerobic exercise each week, such as brisk walking or water aerobics.  Maintain physical fitness, including: ? Strength. ? Flexibility. ? Cardiovascular fitness. ? Endurance. Contact a health care provider if:  You have symptoms that get worse or do not get better after 2 weeks of treatment.  You develop new symptoms. Get help right away if:  You develop severe pain. This information is not intended to replace advice given to you by your health care provider. Make sure you discuss any questions you have with your health care provider. Document  Released: 04/19/2005 Document Revised: 01/27/2017 Document Reviewed: 03/28/2015 Elsevier Interactive Patient Education  2019 [employer]   Biceps Tendon Tendinitis (Proximal) and Tenosynovitis Rehab Ask your health care provider which exercises are safe for you. Do exercises exactly as told by your health care provider and adjust them as directed. It is normal to feel mild stretching, pulling, tightness, or discomfort as you do these exercises, but you should stop right away if you feel sudden pain or your pain gets worse.Do not begin these exercises until told by your health care provider. Stretching and range of motion exercises These exercises warm up your muscles and joints and improve the movement and flexibility of your arm and shoulder. These exercises also help to relieve pain and stiffness. Exercise A: Shoulder flexion  1. Stand facing a wall. Put your left / right hand on the wall. 2. Slide your left / right hand up the wall. Stop when you feel a stretch in your shoulder, or when you reach the angle that is recommended by your health care provider. ? Use your other hand to help raise your arm, if needed. ? As your hand gets higher, you may  need to step closer to the wall. ? Avoid shrugging your shoulder while you raise your arm. To do this, keep your shoulder blade tucked down toward your spine. 3. Hold for __________ seconds. 4. Slowly return to the starting position. Use your other arm to help, if needed. Repeat __________ times. Complete this exercise __________ times a day. Exercise B: Posterior capsule stretch (passive horizontal adduction)  1. Sit or stand and pull your left / right elbow across your chest, toward your other shoulder. Stop when you feel a gentle stretch in the back of your shoulder and upper arm. ? Keep your arm at shoulder height. ? Keep your arm as close to your body as you comfortably can. 2. Hold for __________ seconds. 3. Slowly return to the starting  position. Repeat __________ times. Complete this exercise __________ times a day. Strengthening exercises These exercises build strength and endurance in your arm and shoulder. Endurance is the ability to use your muscles for a long time, even after your muscles get tired. Exercise C: Elbow flexion, supinated  1. Sit on a stable chair without armrests, or stand. 2. If directed, hold a __________ weight in your left / right hand, or hold an exercise band with both hands. Your palms should face up toward the ceiling at the starting position. 3. Bend your left / right elbow and move your hand up toward your shoulder. Keep your other arm straight down, in the starting position. 4. Slowly return to the starting position. Repeat __________ times. Complete this exercise __________ times a day. Exercise D: Scapular protraction, supine  1. Lie on your back on a firm surface. If directed, hold a __________ weight in your left / right hand. 2. Raise your left / right arm straight into the air so your hand is directly above your shoulder joint. 3. Push the weight into the air so your shoulder lifts off of the surface that you are lying on. Do not move your head, neck, or back. 4. Hold for __________ seconds. 5. Slowly return to the starting position. Let your muscles relax completely before you repeat this exercise. Repeat __________ times. Complete this exercise __________ times a day. Exercise E: Scapular retraction  1. Sit in a stable chair without armrests, or stand. 2. Secure an exercise band to a stable object in front of you so the band is at shoulder height. 3. Hold one end of the exercise band in each hand. 4. Squeeze your shoulder blades together and move your elbows slightly behind you. Do not shrug your shoulders. 5. Hold for __________ seconds. 6. Slowly return to the starting position. Repeat __________ times. Complete this exercise __________ times a day. This information is not  intended to replace advice given to you by your health care provider. Make sure you discuss any questions you have with your health care provider. Document Released: 04/19/2005 Document Revised: 12/25/2015 Document Reviewed: 03/28/2015 Elsevier Interactive Patient Education  2019 [employer].

## 2018-08-18 LAB — HEMOGLOBIN A1C
Hgb A1c MFr Bld: 6.6 % of total Hgb — ABNORMAL HIGH (ref <5.7)
Mean Plasma Glucose: 143 (calc)
eAG (mmol/L): 7.9 (calc)

## 2018-08-18 LAB — COMPLETE METABOLIC PANEL WITH GFR
AG Ratio: 1.2 (calc) (ref 1.0–2.5)
ALT: 10 U/L (ref 6–29)
AST: 11 U/L (ref 10–35)
Albumin: 4.4 g/dL (ref 3.6–5.1)
Alkaline phosphatase (APISO): 73 U/L (ref 37–153)
BUN/Creatinine Ratio: 17 (calc) (ref 6–22)
BUN: 19 mg/dL (ref 7–25)
CO2: 32 mmol/L (ref 20–32)
Calcium: 10.6 mg/dL — ABNORMAL HIGH (ref 8.6–10.4)
Chloride: 102 mmol/L (ref 98–110)
Creat: 1.12 mg/dL — ABNORMAL HIGH (ref 0.50–1.05)
GFR, Est African American: 64 mL/min/{1.73_m2} (ref >=60)
GFR, Est Non African American: 55 mL/min/{1.73_m2} — ABNORMAL LOW (ref >=60)
Globulin: 3.6 g/dL (calc) (ref 1.9–3.7)
Glucose, Bld: 109 mg/dL — ABNORMAL HIGH (ref 65–99)
Potassium: 4.1 mmol/L (ref 3.5–5.3)
Sodium: 145 mmol/L (ref 135–146)
Total Bilirubin: 0.5 mg/dL (ref 0.2–1.2)
Total Protein: 8 g/dL (ref 6.1–8.1)

## 2018-08-18 LAB — CBC WITH DIFFERENTIAL/PLATELET
Absolute Monocytes: 384 cells/uL (ref 200–950)
Basophils Absolute: 32 cells/uL (ref 0–200)
Basophils Relative: 0.5 %
Eosinophils Absolute: 365 cells/uL (ref 15–500)
Eosinophils Relative: 5.7 %
HCT: 41.5 % (ref 35.0–45.0)
Hemoglobin: 14.1 g/dL (ref 11.7–15.5)
Lymphs Abs: 2477 cells/uL (ref 850–3900)
MCH: 28.2 pg (ref 27.0–33.0)
MCHC: 34 g/dL (ref 32.0–36.0)
MCV: 83 fL (ref 80.0–100.0)
MPV: 10.2 fL (ref 7.5–12.5)
Monocytes Relative: 6 %
Neutro Abs: 3142 cells/uL (ref 1500–7800)
Neutrophils Relative %: 49.1 %
Platelets: 327 10*3/uL (ref 140–400)
RBC: 5 10*6/uL (ref 3.80–5.10)
RDW: 13.3 % (ref 11.0–15.0)
Total Lymphocyte: 38.7 %
WBC: 6.4 10*3/uL (ref 3.8–10.8)

## 2018-08-18 LAB — LIPID PANEL
Cholesterol: 150 mg/dL (ref <200)
HDL: 52 mg/dL (ref >=50)
LDL Cholesterol (Calc): 84 mg/dL (calc)
Non-HDL Cholesterol (Calc): 98 mg/dL (calc) (ref <130)
Total CHOL/HDL Ratio: 2.9 (calc) (ref <5.0)
Triglycerides: 62 mg/dL (ref <150)

## 2018-08-18 LAB — IRON, TOTAL/TOTAL IRON BINDING CAP
Iron: 74 ug/dL (ref 45–160)
TIBC: 324 mcg/dL (calc) (ref 250–450)

## 2018-08-18 LAB — IRON,?TOTAL/TOTAL IRON BINDING CAP: %SAT: 23 % (calc) (ref 16–45)

## 2018-08-18 LAB — TSH: TSH: 0.55 mIU/L (ref 0.40–4.50)

## 2018-08-18 LAB — MAGNESIUM: Magnesium: 2 mg/dL (ref 1.5–2.5)

## 2018-08-18 LAB — VITAMIN D 25 HYDROXY (VIT D DEFICIENCY, FRACTURES): Vit D, 25-Hydroxy: 31 ng/mL (ref 30–100)

## 2018-10-25 ENCOUNTER — Other Ambulatory Visit: Payer: Self-pay | Admitting: Physician Assistant

## 2018-10-25 DIAGNOSIS — E785 Hyperlipidemia, unspecified: Secondary | ICD-10-CM

## 2018-12-21 NOTE — Progress Notes (Signed)
Complete Physical  Assessment and Plan: Essential hypertension - continue medications, DASH diet, exercise and monitor at home. Call if greater than 130/80.  - CBC with Differential/Platelet - BASIC METABOLIC PANEL WITH GFR - Hepatic function panel - TSH - Urinalysis, Routine w reflex microscopic (not at [facility]) - Microalbumin / creatinine urine ratio - EKG 12-Lead  Abnormal glucose Discussed general issues about diabetes pathophysiology and management., Educational material distributed., Suggested low cholesterol diet., Encouraged aerobic exercise., Discussed foot care., Reminded to get yearly retinal exam. - Hemoglobin A1c - Insulin, fasting  Hyperlipidemia -continue medications, check lipids, decrease fatty foods, increase activity.  - Lipid panel  morbid Obesity Obesity with co morbidities- long discussion about weight loss, diet, and exercise  Vitamin D deficiency - Vit D  25 hydroxy (rtn osteoporosis monitoring)  Medication management - Magnesium  Depression Negative/remission   Other migraine without status migrainosus, not intractable Avoid triggers  Routine general medical examination at a health care facility DUE MGM  Hyperthyroidism monitor  Varicose veins Varicose veins- weight loss discussed, continue compression stockings and elevation  Tinea corporis -     nystatin cream (MYCOSTATIN); Apply 1 application topically 2 (two) times daily. - weight loss suggested    Discussed med's effects and SE's. Screening labs and tests as requested with regular follow-up as recommended.  HPI 58 y.o. female  presents for a complete physical. Gets yeast under her breast during the summer, requesting a cream.  Her blood pressure has been controlled at home, she is on HCTZ 1/2 pill daily, she gets leg cramps when she takes a whole pill, she has stopped the enlapril, today their BP is BP: 134/74  BP Readings from Last 3 Encounters:  12/25/18 134/74  08/17/18 132/76   04/17/18 130/70   BMI is Body mass index is 42.02 kg/m., she is working on diet and exercise. Wt Readings from Last 3 Encounters:  12/25/18 244 lb 12.8 oz (111 kg)  08/17/18 244 lb (110.7 kg)  04/17/18 246 lb (111.6 kg)   She does not workout, has been trying to walk. She denies chest pain, shortness of breath.    She is on cholesterol medication, simvastatin 40mg  and zetia and denies myalgias. Her cholesterol is at goal. The cholesterol last visit was:   Lab Results  Component Value Date   CHOL 150 08/17/2018   HDL 52 08/17/2018   LDLCALC 84 08/17/2018   TRIG 62 08/17/2018   CHOLHDL 2.9 08/17/2018   She has been working on diet and exercise for diabetes, and denies paresthesia of the feet, polydipsia and polyuria. She is on bASA. Last A1C in the office was:  Lab Results  Component Value Date   HGBA1C 6.6 (H) 08/17/2018   Patient is on Vitamin D supplement, 2500.  Lab Results  Component Value Date   VD25OH 31 08/17/2018   She has had hyperthyroidism, she has had normal RAI uptake and negative thyroid BX.  Lab Results  Component Value Date   TSH 0.55 08/17/2018   She is off estrogen for hot flashes/estrogen def.   Current Medications:  Current Outpatient Medications on File Prior to Visit  Medication Sig Dispense Refill  . albuterol (PROAIR HFA) 108 (90 Base) MCG/ACT inhaler Inhale 2 puffs into the lungs every 6 (six) hours as needed for wheezing or shortness of breath. 18 g 1  . aspirin 81 MG tablet Take 81 mg by mouth daily.    Marland Kitchen. azelastine (ASTELIN) 137 MCG/SPRAY nasal spray Place 2 sprays into both  nostrils 2 (two) times daily. Use in each nostril as directed    . cetirizine (ZYRTEC) 10 MG tablet Take 10 mg by mouth daily.    . cholecalciferol (VITAMIN D) 1000 UNITS tablet Take 5,000 Units by mouth daily.    Marland Kitchen ezetimibe (ZETIA) 10 MG tablet TAKE 1 TABLET BY MOUTH EVERY DAY 90 tablet 1  . hydrochlorothiazide (HYDRODIURIL) 25 MG tablet Take 1 tablet (25 mg total) by  mouth daily. 90 tablet 1  . meloxicam (MOBIC) 15 MG tablet Take one daily with food for 2 weeks, can take with tylenol, can not take with aleve, iburpofen, then as needed daily for pain 30 tablet 1  . simvastatin (ZOCOR) 40 MG tablet TAKE 1 TABLET BY MOUTH EVERY DAY 90 tablet 0  . triamcinolone cream (KENALOG) 0.1 % Apply 1 application topically 2 (two) times daily. 80 g 1   No current facility-administered medications on file prior to visit.    Health Maintenance:   Immunization History  Administered Date(s) Administered  . Td 05/07/2007  . Tdap 12/20/2017   Tetanus: 2019 Pneumovax: N/A Flu vaccine: at school work Zostavax: N/A  Pap: 12/20/2017 LMP: 2007 MGM: 06/2014 nl, DUE DEXA: 2014 normal Colonoscopy: 2010 due 2020 EGD: 01/2012- gastritis Echo: 2011 trace MR CT AB/Pelvis 06/2014 RAI thyroid uptake normal Nodule negative  Patient Care Team: Unk Pinto, MD as PCP - General (Internal Medicine) Richmond Campbell, MD as Consulting Physician (Gastroenterology) Delila Pereyra, MD as Consulting Physician (Gynecology)  Medical History:  Past Medical History:  Diagnosis Date  . Allergy   . Depression   . Hyperlipidemia   . Hypertension    Echo 2011 normal EF, trace MR  . Migraines   . Obesity   . Prediabetes   . Vitamin D deficiency    Allergies Allergies  Allergen Reactions  . Singulair [Montelukast Sodium] Swelling  . Lactose Intolerance (Gi)     SURGICAL HISTORY She  has a past surgical history that includes Abdominal hysterectomy and Stapedectomy (Right, 2000). FAMILY HISTORY Her family history includes Arthritis in her mother; Asthma in her mother; Cancer (age of onset: 63) in her maternal grandfather; Cancer (age of onset: 82) in her maternal grandmother; Diabetes in her daughter and father; Hyperlipidemia in her father; Hypertension in her father, mother, and sister. SOCIAL HISTORY She  reports that she has never smoked. She has never used smokeless  tobacco. She reports that she does not drink alcohol or use drugs.  Review of Systems  Constitutional: Negative.   HENT: Negative.   Eyes: Negative.   Respiratory: Negative.   Cardiovascular: Negative.   Gastrointestinal: Negative.   Genitourinary: Negative.   Musculoskeletal: Negative.   Skin: Negative.   Neurological: Negative for dizziness, tingling, tremors, sensory change, speech change, focal weakness, seizures, loss of consciousness and headaches.  Endo/Heme/Allergies: Negative.   Psychiatric/Behavioral: Negative.     Physical Exam: Estimated body mass index is 42.02 kg/m as calculated from the following:   Height as of this encounter: 5\' 4"  (1.626 m).   Weight as of this encounter: 244 lb 12.8 oz (111 kg). BP 134/74   Pulse 66   Temp 97.8 F (36.6 C)   Ht 5\' 4"  (1.626 m)   Wt 244 lb 12.8 oz (111 kg)   SpO2 98%   BMI 42.02 kg/m  Wt Readings from Last 3 Encounters:  12/25/18 244 lb 12.8 oz (111 kg)  08/17/18 244 lb (110.7 kg)  04/17/18 246 lb (111.6 kg)   General Appearance: Well  nourished, in no apparent distress. Eyes: PERRLA, EOMs, conjunctiva no swelling or erythema, normal fundi and vessels. Sinuses: No Frontal/maxillary tenderness ENT/Mouth: Ext aud canals clear, normal light reflex with TMs without erythema, bulging.  Good dentition. No erythema, swelling, or exudate on post pharynx. Tonsils not swollen or erythematous. Hearing normal.  Neck: Supple, thyroid normal. No bruits Respiratory: Respiratory effort normal, BS equal bilaterally without rales, rhonchi, wheezing or stridor. Cardio: RRR without murmurs, rubs or gallops. Brisk peripheral pulses without edema.  Chest: symmetric, with normal excursions and percussion. Breasts: no masses Abdomen: Soft, +BS. Non tender, no guarding, rebound, hernias, masses, or organomegaly. .  Lymphatics: Non tender without lymphadenopathy.  Genitourinary: normal external genitalia, vulva, vagina, cervix, uterus and  adnexa. Musculoskeletal: Full ROM all peripheral extremities,5/5 strength, and normal gait. Skin: Warm, dry without rashes, lesions, ecchymosis. 4x335mm nevus unchanged right medial foot Neuro: Cranial nerves intact, reflexes equal bilaterally. Normal muscle tone, no cerebellar symptoms. Sensation intact.  Psych: Awake and oriented X 3, normal affect, Insight and Judgment appropriate.   EKG: WNL no changes. AORTA SCAN: defer   Laura Branch 3:30 PM

## 2018-12-25 ENCOUNTER — Other Ambulatory Visit: Payer: Self-pay

## 2018-12-25 ENCOUNTER — Encounter: Payer: Self-pay | Admitting: Physician Assistant

## 2018-12-25 ENCOUNTER — Ambulatory Visit (INDEPENDENT_AMBULATORY_CARE_PROVIDER_SITE_OTHER): Payer: BC Managed Care – PPO | Admitting: Physician Assistant

## 2018-12-25 VITALS — BP 134/74 | HR 66 | Temp 97.8°F | Ht 64.0 in | Wt 244.8 lb

## 2018-12-25 DIAGNOSIS — D649 Anemia, unspecified: Secondary | ICD-10-CM

## 2018-12-25 DIAGNOSIS — E559 Vitamin D deficiency, unspecified: Secondary | ICD-10-CM

## 2018-12-25 DIAGNOSIS — G43809 Other migraine, not intractable, without status migrainosus: Secondary | ICD-10-CM

## 2018-12-25 DIAGNOSIS — E041 Nontoxic single thyroid nodule: Secondary | ICD-10-CM

## 2018-12-25 DIAGNOSIS — Z136 Encounter for screening for cardiovascular disorders: Secondary | ICD-10-CM | POA: Diagnosis not present

## 2018-12-25 DIAGNOSIS — E0590 Thyrotoxicosis, unspecified without thyrotoxic crisis or storm: Secondary | ICD-10-CM

## 2018-12-25 DIAGNOSIS — F3341 Major depressive disorder, recurrent, in partial remission: Secondary | ICD-10-CM

## 2018-12-25 DIAGNOSIS — Z1389 Encounter for screening for other disorder: Secondary | ICD-10-CM

## 2018-12-25 DIAGNOSIS — I8393 Asymptomatic varicose veins of bilateral lower extremities: Secondary | ICD-10-CM

## 2018-12-25 DIAGNOSIS — E785 Hyperlipidemia, unspecified: Secondary | ICD-10-CM

## 2018-12-25 DIAGNOSIS — Z0000 Encounter for general adult medical examination without abnormal findings: Secondary | ICD-10-CM

## 2018-12-25 DIAGNOSIS — Z0001 Encounter for general adult medical examination with abnormal findings: Secondary | ICD-10-CM

## 2018-12-25 DIAGNOSIS — Z79899 Other long term (current) drug therapy: Secondary | ICD-10-CM

## 2018-12-25 DIAGNOSIS — I10 Essential (primary) hypertension: Secondary | ICD-10-CM

## 2018-12-25 DIAGNOSIS — R7309 Other abnormal glucose: Secondary | ICD-10-CM

## 2018-12-25 DIAGNOSIS — Z13220 Encounter for screening for lipoid disorders: Secondary | ICD-10-CM | POA: Diagnosis not present

## 2018-12-25 DIAGNOSIS — Z1329 Encounter for screening for other suspected endocrine disorder: Secondary | ICD-10-CM

## 2018-12-25 DIAGNOSIS — B354 Tinea corporis: Secondary | ICD-10-CM

## 2018-12-25 DIAGNOSIS — Z131 Encounter for screening for diabetes mellitus: Secondary | ICD-10-CM

## 2018-12-25 MED ORDER — NYSTATIN 100000 UNIT/GM EX CREA: 1.0000 "application " | TOPICAL_CREAM | Freq: Two times a day (BID) | CUTANEOUS | 1 refills | Status: DC

## 2018-12-25 NOTE — Patient Instructions (Addendum)
HOW TO SCHEDULE A MAMMOGRAM  [facility]  7 a.m.-6:30 p.m., Monday 7 a.m.-5 p.m., Tuesday-Friday Schedule an appointment by calling [phone].  [facility] Schedule an appointment by calling [phone].   Colon cancer is 3rd most diagnosed cancer and 2nd leading cause of death in both men and women 57 years of age and older despite being one of the most preventable and treatable cancers if found early.  4 of out 5 people diagnosed with colon cancer have NO prior family history.  When caught EARLY 90% of colon cancer is curable. Dr. Kinnie ScalesMedoff Phone: [phone];  Diabetes is a very complicated disease...lets simplify it.  An easy way to look at it to understand the complications is if you think of the extra sugar floating in your blood stream as glass shards floating through your blood stream.    Diabetes affects your small vessels first: 1) The glass shards (sugar) scraps down the tiny blood vessels in your eyes and lead to diabetic retinopathy, the leading cause of blindness in the [place]. Diabetes is the leading cause of newly diagnosed adult (5420 to 57 years of age) blindness in the [place].  2) The glass shards scratches down the tiny vessels of your legs leading to nerve damage called neuropathy and can lead to amputations of your feet. More than 60% of all non-traumatic amputations of lower limbs occur in people with diabetes.  3) Over time the small vessels in your brain are shredded and closed off, individually this does not cause any problems but over a long period of time many of the small vessels being blocked can lead to Vascular Dementia.   4) Your kidney's are a filter system and have a "net" that keeps certain things in the body and lets bad things out. Sugar shreds this net and leads to kidney damage and eventually failure. Decreasing the sugar that is destroying the net and certain blood pressure medications can help stop or decrease  progression of kidney disease. Diabetes was the primary cause of kidney failure in 44 percent of all new cases in 2011.  5) Diabetes also destroys the small vessels in your penis that lead to erectile dysfunction. Eventually the vessels are so damaged that you may not be responsive to cialis or viagra.   Diabetes and your large vessels: Your larger vessels consist of your coronary arteries in your heart and the carotid vessels to your brain. Diabetes or even increased sugars put you at 300% increased risk of heart attack and stroke and this is why.. The sugar scrapes down your large blood vessels and your body sees this as an internal injury and tries to repair itself. Just like you get a scab on your skin, your platelets will stick to the blood vessel wall trying to heal it. This is why we have diabetics on low dose aspirin daily, this prevents the platelets from sticking and can prevent plaque formation. In addition, your body takes cholesterol and tries to shove it into the open wound. This is why we want your LDL, or bad cholesterol, below 70.   The combination of platelets and cholesterol over 5-10 years forms plaque that can break off and cause a heart attack or stroke.   PLEASE REMEMBER:  Diabetes is preventable! Up to 85 percent of complications and morbidities among individuals with type 2 diabetes can be prevented, delayed, or effectively treated and minimized with regular visits to a health professional, appropriate monitoring and medication, and  a healthy diet and lifestyle.  STARTING A DIABETIC MEDICATION  The medication we are going to start you on is a sodium-glucose cotransporter-2 (SGLT2) inhibitors for your diabetes and weight.   This can decrease your blood pressure so please contact us if you have any dizziness, sometime we need to decrease or stop fluid pills or decrease BP meds.  You are peeing out 300-400 calories a day of sugar which can lead to yeast infections, you can take  the diflucan as needed but if the infections continue we will stop the medications.  If you get any pain or discomfort around your buttocks or genitals please let us know if it does not go away. We will have you follow up in 1 month to check your kidney function and weight. Call if you need anything.

## 2018-12-26 LAB — MICROALBUMIN / CREATININE URINE RATIO
Creatinine, Urine: 98 mg/dL (ref 20–275)
Microalb, Ur: <0.2 mg/dL

## 2018-12-26 LAB — URINALYSIS, ROUTINE W REFLEX MICROSCOPIC
Bilirubin Urine: NEGATIVE
Glucose, UA: NEGATIVE
Hgb urine dipstick: NEGATIVE
Ketones, ur: NEGATIVE
Leukocytes,Ua: NEGATIVE
Nitrite: NEGATIVE
Protein, ur: NEGATIVE
Specific Gravity, Urine: 1.014 (ref 1.001–1.03)
pH: 6 (ref 5.0–8.0)

## 2018-12-26 LAB — CBC WITH DIFFERENTIAL/PLATELET
Absolute Monocytes: 360 cells/uL (ref 200–950)
Basophils Absolute: 37 cells/uL (ref 0–200)
Basophils Relative: 0.6 %
Eosinophils Absolute: 477 cells/uL (ref 15–500)
Eosinophils Relative: 7.7 %
HCT: 41 % (ref 35.0–45.0)
Hemoglobin: 13.8 g/dL (ref 11.7–15.5)
Lymphs Abs: 2728 cells/uL (ref 850–3900)
MCH: 28.6 pg (ref 27.0–33.0)
MCHC: 33.7 g/dL (ref 32.0–36.0)
MCV: 84.9 fL (ref 80.0–100.0)
MPV: 10.6 fL (ref 7.5–12.5)
Monocytes Relative: 5.8 %
Neutro Abs: 2598 cells/uL (ref 1500–7800)
Neutrophils Relative %: 41.9 %
Platelets: 344 10*3/uL (ref 140–400)
RBC: 4.83 10*6/uL (ref 3.80–5.10)
RDW: 13.5 % (ref 11.0–15.0)
Total Lymphocyte: 44 %
WBC: 6.2 10*3/uL (ref 3.8–10.8)

## 2018-12-26 LAB — COMPLETE METABOLIC PANEL WITH GFR
AG Ratio: 1.5 (calc) (ref 1.0–2.5)
ALT: 20 U/L (ref 6–29)
AST: 15 U/L (ref 10–35)
Albumin: 4.4 g/dL (ref 3.6–5.1)
Alkaline phosphatase (APISO): 80 U/L (ref 37–153)
BUN: 12 mg/dL (ref 7–25)
CO2: 29 mmol/L (ref 20–32)
Calcium: 10 mg/dL (ref 8.6–10.4)
Chloride: 105 mmol/L (ref 98–110)
Creat: 0.88 mg/dL (ref 0.50–1.05)
GFR, Est African American: 85 mL/min/{1.73_m2} (ref >=60)
GFR, Est Non African American: 73 mL/min/{1.73_m2} (ref >=60)
Globulin: 3 g/dL (calc) (ref 1.9–3.7)
Glucose, Bld: 96 mg/dL (ref 65–99)
Potassium: 4.5 mmol/L (ref 3.5–5.3)
Sodium: 140 mmol/L (ref 135–146)
Total Bilirubin: 0.3 mg/dL (ref 0.2–1.2)
Total Protein: 7.4 g/dL (ref 6.1–8.1)

## 2018-12-26 LAB — LIPID PANEL
Cholesterol: 156 mg/dL (ref <200)
HDL: 54 mg/dL (ref >=50)
LDL Cholesterol (Calc): 85 mg/dL (calc)
Non-HDL Cholesterol (Calc): 102 mg/dL (calc) (ref <130)
Total CHOL/HDL Ratio: 2.9 (calc) (ref <5.0)
Triglycerides: 76 mg/dL (ref <150)

## 2018-12-26 LAB — HEMOGLOBIN A1C
Hgb A1c MFr Bld: 6.3 % of total Hgb — ABNORMAL HIGH (ref <5.7)
Mean Plasma Glucose: 134 (calc)
eAG (mmol/L): 7.4 (calc)

## 2018-12-26 LAB — TSH: TSH: 0.52 mIU/L (ref 0.40–4.50)

## 2018-12-26 LAB — MAGNESIUM: Magnesium: 2.1 mg/dL (ref 1.5–2.5)

## 2018-12-26 LAB — VITAMIN D 25 HYDROXY (VIT D DEFICIENCY, FRACTURES): Vit D, 25-Hydroxy: 38 ng/mL (ref 30–100)

## 2019-04-04 ENCOUNTER — Ambulatory Visit: Payer: BC Managed Care – PPO | Admitting: Physician Assistant

## 2019-04-05 ENCOUNTER — Encounter: Payer: Self-pay | Admitting: Physician Assistant

## 2019-04-05 ENCOUNTER — Ambulatory Visit: Payer: BC Managed Care – PPO | Admitting: Physician Assistant

## 2019-04-05 ENCOUNTER — Other Ambulatory Visit: Payer: Self-pay

## 2019-04-05 VITALS — BP 126/88 | HR 88 | Temp 97.5°F | Wt 248.0 lb

## 2019-04-05 DIAGNOSIS — E785 Hyperlipidemia, unspecified: Secondary | ICD-10-CM

## 2019-04-05 DIAGNOSIS — E559 Vitamin D deficiency, unspecified: Secondary | ICD-10-CM

## 2019-04-05 DIAGNOSIS — I10 Essential (primary) hypertension: Secondary | ICD-10-CM

## 2019-04-05 DIAGNOSIS — Z79899 Other long term (current) drug therapy: Secondary | ICD-10-CM

## 2019-04-05 DIAGNOSIS — R7309 Other abnormal glucose: Secondary | ICD-10-CM

## 2019-04-05 MED ORDER — EZETIMIBE 10 MG PO TABS: 10.0000 mg | ORAL_TABLET | Freq: Every day | ORAL | 1 refills | Status: DC

## 2019-04-05 MED ORDER — SIMVASTATIN 40 MG PO TABS: 40.0000 mg | ORAL_TABLET | Freq: Every day | ORAL | 1 refills | Status: DC

## 2019-04-05 NOTE — Patient Instructions (Signed)
Diet soda leads to weight gain.  We recently discovered that the artifical sugar in the soda stops an enzyme in your stomach that is suppose to signal that your brain is full. So patients that drink a lot of diet soda will never feel full and tend to over eat. So please cut back on diet soda and it can help with your weight loss.   A study found that two or more sodas a day increased the risk of hypertension, diabetes, stroke.   Another recent study found that higher consumption of soda and juice was associated with increased risk of cancer and breast cancer.   In animal studies artifical sweeteners affect the gut bacteria and may contribute to chronic disease this way.   General eating tips  What to Avoid . Avoid added sugars o Often added sugar can be found in processed foods such as many condiments, dry cereals, cakes, cookies, chips, crisps, crackers, candies, sweetened drinks, etc.  o Read labels and AVOID/DECREASE use of foods with the following in their ingredient list: Sugar, fructose, high fructose corn syrup, sucrose, glucose, maltose, dextrose, molasses, cane sugar, brown sugar, any type of syrup, agave nectar, etc.   . Avoid snacking in between meals- drink water or if you feel you need a snack, pick a high water content snack such as cucumbers, watermelon, or any veggie.  Marland Kitchen Avoid foods made with flour o If you are going to eat food made with flour, choose those made with whole-grains; and, minimize your consumption as much as is tolerable . Avoid processed foods o These foods are generally stocked in the middle of the grocery store.  o Focus on shopping on the perimeter of the grocery.  What to Include . Vegetables o GREEN LEAFY VEGETABLES: Kale, spinach, mustard greens, collard greens, cabbage, broccoli, etc. o OTHER: Asparagus, cauliflower, eggplant, carrots, peas, Brussel sprouts, tomatoes, bell peppers, zucchini, beets, cucumbers, etc. . Grains, seeds, and legumes o Beans:  kidney beans, black eyed peas, garbanzo beans, black beans, pinto beans, etc. o Whole, unrefined grains: brown rice, barley, bulgur, oatmeal, etc. . Healthy fats  o Avoid highly processed fats such as vegetable oil o Examples of healthy fats: avocado, olives, virgin olive oil, dark chocolate (?72% Cocoa), nuts (peanuts, almonds, walnuts, cashews, pecans, etc.) o Please still do small amount of these healthy fats, they are dense in calories.  . Low - Moderate Intake of Animal Sources of Protein o Meat sources: chicken, [place], salmon, tuna. Limit to 4 ounces of meat at one time or the size of your palm. o Consider limiting dairy sources, but when choosing dairy focus on: PLAIN [place] yogurt, cottage cheese, high-protein milk . Fruit o Choose berries

## 2019-04-05 NOTE — Progress Notes (Signed)
3 month follow up  Assessment and Plan: Essential hypertension - continue medications, DASH diet, exercise and monitor at home. Call if greater than 130/80.  - CBC with Differential/Platelet - BASIC METABOLIC PANEL WITH GFR - Hepatic function panel - TSH  Abnormal glucose Discussed general issues about diabetes pathophysiology and management., Educational material distributed., Suggested low cholesterol diet., Encouraged aerobic exercise., Discussed foot care., Reminded to get yearly retinal exam. - Hemoglobin A1c - may start on medication.   Hyperlipidemia -continue medications for now, may switch simvastatin to rouvastatin to reach goal 70, check lipids, decrease fatty foods, increase activity. - Lipid panel  Morbid Obesity - follow up 3 months for progress monitoring - increase veggies, decrease carbs - long discussion about weight loss, diet, and exercise  Medication management - Magnesium  Hyperthyroidism Monitor  Anemia, unspecified type -     Iron,Total/Total Iron Binding Cap  Vitamin D deficiency -     VITAMIN D 25 Hydroxy (Vit-D Deficiency, Fractures)   Discussed med's effects and SE's. Screening labs and tests as requested with regular follow-up as recommended.  Future Appointments  Date Time Provider Department Center  01/28/2020  2:00 PM Quentin Mulling, PA-C [facility] None     HPI 58 y.o. female  presents for follow up for HTN, chol, hyperthyroidism, preDM, morbid obesity.   BMI is Body mass index is 42.57 kg/m., she is not working on diet and exercise. She has been drinking sodas, loves cheerwine, working A LOT and not eating dinner/snacking more.  Wt Readings from Last 3 Encounters:  04/05/19 248 lb (112.5 kg)  12/25/18 244 lb 12.8 oz (111 kg)  08/17/18 244 lb (110.7 kg)   Her blood pressure has been controlled at work and runs well, she is on enalapril and has been taking HCTZ 1/2 pill, today their BP is BP: 126/88 She does not workout.  She  denies chest pain, dizziness.  She is on cholesterol medication, simvastatin 40 and zetia 10 and denies myalgias. Her cholesterol is at goal less than 100 but we may want to set new goal of 70. No FM HX of CAD.  The cholesterol last visit was:   Lab Results  Component Value Date   CHOL 156 12/25/2018   HDL 54 12/25/2018   LDLCALC 85 12/25/2018   TRIG 76 12/25/2018   CHOLHDL 2.9 12/25/2018   She has been working on diet and exercise for prediabetes, and denies paresthesia of the feet, polydipsia and polyuria. She is on bASA. Last A1C in the office was:  Lab Results  Component Value Date   HGBA1C 6.3 (H) 12/25/2018   Patient is on Vitamin D supplement, 5000 daily.  Lab Results  Component Value Date   VD25OH 38 12/25/2018   She has had hyperthyroidism, she has had normal RAI uptake and negative thyroid BX.  Lab Results  Component Value Date   TSH 0.52 12/25/2018   Current Medications:  Current Outpatient Medications on File Prior to Visit  Medication Sig  . albuterol (PROAIR HFA) 108 (90 Base) MCG/ACT inhaler Inhale 2 puffs into the lungs every 6 (six) hours as needed for wheezing or shortness of breath.  Marland Kitchen aspirin 81 MG tablet Take 81 mg by mouth daily.  Marland Kitchen azelastine (ASTELIN) 137 MCG/SPRAY nasal spray Place 2 sprays into both nostrils 2 (two) times daily. Use in each nostril as directed  . cetirizine (ZYRTEC) 10 MG tablet Take 10 mg by mouth daily.  . cholecalciferol (VITAMIN D) 1000 UNITS tablet Take 5,000 Units  by mouth daily.  . hydrochlorothiazide (HYDRODIURIL) 25 MG tablet Take 1 tablet (25 mg total) by mouth daily.  . meloxicam (MOBIC) 15 MG tablet Take one daily with food for 2 weeks, can take with tylenol, can not take with aleve, iburpofen, then as needed daily for pain  . nystatin cream (MYCOSTATIN) Apply 1 application topically 2 (two) times daily.  Marland Kitchen triamcinolone cream (KENALOG) 0.1 % Apply 1 application topically 2 (two) times daily.   No current  facility-administered medications on file prior to visit.     Problem list She has Hypertension; Hyperlipidemia; Depression; Migraines; Vitamin D deficiency; Abnormal glucose; Morbid obesity ([place]); Medication management; Varicose veins of both lower extremities; Hyperthyroidism; Thyroid nodule; and Anemia on their problem list.   A SURGICAL HISTORY She  has a past surgical history that includes Abdominal hysterectomy and Stapedectomy (Right, 2000). FAMILY HISTORY Her family history includes Arthritis in her mother; Asthma in her mother; Cancer (age of onset: 105) in her maternal grandfather; Cancer (age of onset: 38) in her maternal grandmother; Diabetes in her daughter and father; Hyperlipidemia in her father; Hypertension in her father, mother, and sister. SOCIAL HISTORY She  reports that she has never smoked. She has never used smokeless tobacco. She reports that she does not drink alcohol or use drugs.  Review of Systems  Constitutional: Negative.   HENT: Negative.   Eyes: Negative.   Respiratory: Negative.   Cardiovascular: Negative.   Gastrointestinal: Negative.   Genitourinary: Negative.   Musculoskeletal: Positive for joint pain. Negative for back pain, falls, myalgias and neck pain.  Skin: Negative.   Neurological: Negative.   Endo/Heme/Allergies: Negative.   Psychiatric/Behavioral: Negative.     Physical Exam: Estimated body mass index is 42.57 kg/m as calculated from the following:   Height as of 12/25/18: 5\' 4"  (1.626 m).   Weight as of this encounter: 248 lb (112.5 kg). BP 126/88   Pulse 88   Temp (!) 97.5 F (36.4 C)   Wt 248 lb (112.5 kg)   SpO2 99%   BMI 42.57 kg/m  Wt Readings from Last 3 Encounters:  04/05/19 248 lb (112.5 kg)  12/25/18 244 lb 12.8 oz (111 kg)  08/17/18 244 lb (110.7 kg)   General Appearance: Well nourished, in no apparent distress. Eyes: PERRLA, EOMs, conjunctiva no swelling or erythema, normal fundi and vessels. Sinuses: No  Frontal/maxillary tenderness ENT/Mouth: Ext aud canals clear, normal light reflex with TMs without erythema, bulging.  Good dentition. No erythema, swelling, or exudate on post pharynx. Tonsils not swollen or erythematous. Hearing normal.  Neck: Supple, thyroid normal. No bruits Respiratory: Respiratory effort normal, BS equal bilaterally without rales, rhonchi, wheezing or stridor. Cardio: RRR with systolic murmur RSB without rubs or gallops. Brisk peripheral pulses without edema.  Chest: symmetric, with normal excursions and percussion. Abdomen: Soft, +BS. Non tender, no guarding, rebound, hernias, masses, or organomegaly. Lymphatics: Non tender without lymphadenopathy.  Musculoskeletal: Full ROM all peripheral extremities,5/5 strength, and normal gait. Shoulder right:  Neurovascularly intact distally. Good strength with stress of rotator cuff but causes pain, pain with abduction to 80 degrees, pain with internal and external rotation. Positive impingement signs. Negative empty can test.  Skin: Warm, dry without rashes, lesions, ecchymosis. Neuro: Cranial nerves intact, reflexes equal bilaterally. Normal muscle tone, no cerebellar symptoms. Sensation intact.  Psych: Awake and oriented X 3, normal affect, Insight and Judgment appropriate.    Vicie Mutters 10:55 AM

## 2019-04-06 LAB — CBC WITH DIFFERENTIAL/PLATELET
Absolute Monocytes: 343 cells/uL (ref 200–950)
Basophils Absolute: 31 cells/uL (ref 0–200)
Basophils Relative: 0.6 %
Eosinophils Absolute: 390 cells/uL (ref 15–500)
Eosinophils Relative: 7.5 %
HCT: 40.8 % (ref 35.0–45.0)
Hemoglobin: 13.4 g/dL (ref 11.7–15.5)
Lymphs Abs: 2236 cells/uL (ref 850–3900)
MCH: 27.6 pg (ref 27.0–33.0)
MCHC: 32.8 g/dL (ref 32.0–36.0)
MCV: 84 fL (ref 80.0–100.0)
MPV: 9.9 fL (ref 7.5–12.5)
Monocytes Relative: 6.6 %
Neutro Abs: 2200 cells/uL (ref 1500–7800)
Neutrophils Relative %: 42.3 %
Platelets: 315 10*3/uL (ref 140–400)
RBC: 4.86 10*6/uL (ref 3.80–5.10)
RDW: 13.6 % (ref 11.0–15.0)
Total Lymphocyte: 43 %
WBC: 5.2 10*3/uL (ref 3.8–10.8)

## 2019-04-06 LAB — LIPID PANEL
Cholesterol: 167 mg/dL (ref <200)
HDL: 57 mg/dL (ref >=50)
LDL Cholesterol (Calc): 95 mg/dL (calc)
Non-HDL Cholesterol (Calc): 110 mg/dL (calc) (ref <130)
Total CHOL/HDL Ratio: 2.9 (calc) (ref <5.0)
Triglycerides: 67 mg/dL (ref <150)

## 2019-04-06 LAB — COMPLETE METABOLIC PANEL WITH GFR
AG Ratio: 1.4 (calc) (ref 1.0–2.5)
ALT: 19 U/L (ref 6–29)
AST: 15 U/L (ref 10–35)
Albumin: 4.2 g/dL (ref 3.6–5.1)
Alkaline phosphatase (APISO): 81 U/L (ref 37–153)
BUN: 12 mg/dL (ref 7–25)
CO2: 29 mmol/L (ref 20–32)
Calcium: 9.7 mg/dL (ref 8.6–10.4)
Chloride: 103 mmol/L (ref 98–110)
Creat: 0.95 mg/dL (ref 0.50–1.05)
GFR, Est African American: 77 mL/min/{1.73_m2} (ref >=60)
GFR, Est Non African American: 66 mL/min/{1.73_m2} (ref >=60)
Globulin: 3.1 g/dL (calc) (ref 1.9–3.7)
Glucose, Bld: 103 mg/dL — ABNORMAL HIGH (ref 65–99)
Potassium: 3.7 mmol/L (ref 3.5–5.3)
Sodium: 142 mmol/L (ref 135–146)
Total Bilirubin: 0.5 mg/dL (ref 0.2–1.2)
Total Protein: 7.3 g/dL (ref 6.1–8.1)

## 2019-04-06 LAB — MAGNESIUM: Magnesium: 1.8 mg/dL (ref 1.5–2.5)

## 2019-04-06 LAB — TSH: TSH: 0.67 mIU/L (ref 0.40–4.50)

## 2019-04-06 LAB — HEMOGLOBIN A1C
Hgb A1c MFr Bld: 6.2 % of total Hgb — ABNORMAL HIGH (ref <5.7)
Mean Plasma Glucose: 131 (calc)
eAG (mmol/L): 7.3 (calc)

## 2019-04-06 LAB — VITAMIN D 25 HYDROXY (VIT D DEFICIENCY, FRACTURES): Vit D, 25-Hydroxy: 34 ng/mL (ref 30–100)

## 2019-04-13 LAB — HM MAMMOGRAPHY

## 2019-04-18 ENCOUNTER — Encounter: Payer: Self-pay | Admitting: *Deleted

## 2019-05-17 ENCOUNTER — Encounter: Payer: Self-pay | Admitting: *Deleted

## 2019-07-06 NOTE — Progress Notes (Signed)
3 month follow up  Assessment and Plan: Essential hypertension - continue medications, DASH diet, exercise and monitor at home. Call if greater than 130/80.  - CBC with Differential/Platelet - BASIC METABOLIC PANEL WITH GFR - Hepatic function panel - TSH  Abnormal glucose Discussed general issues about diabetes pathophysiology and management., Educational material distributed., Suggested low cholesterol diet., Encouraged aerobic exercise., Discussed foot care., Reminded to get yearly retinal exam. - Hemoglobin A1c - may start on medication.   Hyperlipidemia -continue medications for now, may switch simvastatin to rouvastatin to reach goal 70, check lipids, decrease fatty foods, increase activity. - Lipid panel  Morbid Obesity - follow up 3 months for progress monitoring - increase veggies, decrease carbs - long discussion about weight loss, diet, and exercise  Medication management - Magnesium  Hyperthyroidism Monitor  Vitamin D deficiency -     VITAMIN D 25 Hydroxy (Vit-D Deficiency, Fractures)   Discussed med's effects and SE's. Screening labs and tests as requested with regular follow-up as recommended.  Future Appointments  Date Time Provider [place]  01/28/2020  2:00 PM Vicie Mutters, PA-C [facility] None     HPI 59 y.o. female  presents for follow up for HTN, chol, hyperthyroidism, preDM, morbid obesity.   BMI is Body mass index is 42.91 kg/m., she is not working on diet and exercise. She has been drinking sodas, loves cheerwine, working A LOT and not eating dinner/snacking more. She has occ left knee pain, states she is not walking as much due to that, she takes tylenol but has stopped taking aleve.  Has seen murphy wainer in the past.  Wt Readings from Last 3 Encounters:  07/09/19 250 lb (113.4 kg)  04/05/19 248 lb (112.5 kg)  12/25/18 244 lb 12.8 oz (111 kg)   Her blood pressure has been controlled at work and runs well, she is on enalapril and  has been taking HCTZ 1/2 pill, today their BP is BP: 128/78 She does not workout.  She denies chest pain, dizziness.  She is on cholesterol medication, simvastatin 40 and zetia 10 and denies myalgias. Her cholesterol is at goal less than 100 but we may want to set new goal of 70. No FM HX of CAD.  The cholesterol last visit was:   Lab Results  Component Value Date   CHOL 167 04/05/2019   HDL 57 04/05/2019   LDLCALC 95 04/05/2019   TRIG 67 04/05/2019   CHOLHDL 2.9 04/05/2019   She has been working on diet and exercise for prediabetes, and denies paresthesia of the feet, polydipsia and polyuria. She is on bASA. Last A1C in the office was:  Lab Results  Component Value Date   HGBA1C 6.2 (H) 04/05/2019   Patient is on Vitamin D supplement, 5000 daily.  Lab Results  Component Value Date   VD25OH 34 04/05/2019   She has had hyperthyroidism, she has had normal RAI uptake and negative thyroid BX.  Lab Results  Component Value Date   TSH 0.67 04/05/2019   Current Medications:  Current Outpatient Medications on File Prior to Visit  Medication Sig  . albuterol (PROAIR HFA) 108 (90 Base) MCG/ACT inhaler Inhale 2 puffs into the lungs every 6 (six) hours as needed for wheezing or shortness of breath.  Marland Kitchen aspirin 81 MG tablet Take 81 mg by mouth daily.  Marland Kitchen azelastine (ASTELIN) 137 MCG/SPRAY nasal spray Place 2 sprays into both nostrils 2 (two) times daily. Use in each nostril as directed  . cetirizine (ZYRTEC) 10  MG tablet Take 10 mg by mouth daily.  . cholecalciferol (VITAMIN D) 1000 UNITS tablet Take 5,000 Units by mouth daily.  Marland Kitchen ezetimibe (ZETIA) 10 MG tablet Take 1 tablet (10 mg total) by mouth daily.  . hydrochlorothiazide (HYDRODIURIL) 25 MG tablet Take 1 tablet (25 mg total) by mouth daily.  . meloxicam (MOBIC) 15 MG tablet Take one daily with food for 2 weeks, can take with tylenol, can not take with aleve, iburpofen, then as needed daily for pain  . Multiple Vitamins-Minerals (AIRBORNE)  CHEW Chew by mouth.  . nystatin cream (MYCOSTATIN) Apply 1 application topically 2 (two) times daily.  . simvastatin (ZOCOR) 40 MG tablet Take 1 tablet (40 mg total) by mouth daily.  Marland Kitchen triamcinolone cream (KENALOG) 0.1 % Apply 1 application topically 2 (two) times daily.   No current facility-administered medications on file prior to visit.    Problem list She has Hypertension; Hyperlipidemia; Depression; Migraines; Vitamin D deficiency; Abnormal glucose; Morbid obesity (HCC); Medication management; Varicose veins of both lower extremities; Hyperthyroidism; Thyroid nodule; and Anemia on their problem list.   A SURGICAL HISTORY She  has a past surgical history that includes Abdominal hysterectomy and Stapedectomy (Right, 2000). FAMILY HISTORY Her family history includes Arthritis in her mother; Asthma in her mother; Cancer (age of onset: 7) in her maternal grandfather; Cancer (age of onset: 48) in her maternal grandmother; Diabetes in her daughter and father; Hyperlipidemia in her father; Hypertension in her father, mother, and sister. SOCIAL HISTORY She  reports that she has never smoked. She has never used smokeless tobacco. She reports that she does not drink alcohol or use drugs.  Review of Systems  Constitutional: Negative.   HENT: Negative.   Eyes: Negative.   Respiratory: Negative.   Cardiovascular: Negative.   Gastrointestinal: Negative.   Genitourinary: Negative.   Musculoskeletal: Positive for joint pain. Negative for back pain, falls, myalgias and neck pain.  Skin: Negative.   Neurological: Negative.   Endo/Heme/Allergies: Negative.   Psychiatric/Behavioral: Negative.     Physical Exam: Estimated body mass index is 42.91 kg/m as calculated from the following:   Height as of 12/25/18: 5\' 4"  (1.626 m).   Weight as of this encounter: 250 lb (113.4 kg). BP 128/78   Pulse 76   Temp 97.7 F (36.5 C)   Wt 250 lb (113.4 kg)   SpO2 96%   BMI 42.91 kg/m  Wt Readings  from Last 3 Encounters:  07/09/19 250 lb (113.4 kg)  04/05/19 248 lb (112.5 kg)  12/25/18 244 lb 12.8 oz (111 kg)   General Appearance: Well nourished, in no apparent distress. Eyes: PERRLA, EOMs, conjunctiva no swelling or erythema, normal fundi and vessels. Sinuses: No Frontal/maxillary tenderness ENT/Mouth: Ext aud canals clear, normal light reflex with TMs without erythema, bulging.  Good dentition. No erythema, swelling, or exudate on post pharynx. Tonsils not swollen or erythematous. Hearing normal.  Neck: Supple, thyroid normal. No bruits Respiratory: Respiratory effort normal, BS equal bilaterally without rales, rhonchi, wheezing or stridor. Cardio: RRR with systolic murmur RSB without rubs or gallops. Brisk peripheral pulses without edema.  Chest: symmetric, with normal excursions and percussion. Abdomen: Soft, +BS. Non tender, no guarding, rebound, hernias, masses, or organomegaly. Lymphatics: Non tender without lymphadenopathy.  Musculoskeletal: Full ROM all peripheral extremities,5/5 strength, and normal gait. Skin: Warm, dry without rashes, lesions, ecchymosis. Neuro: Cranial nerves intact, reflexes equal bilaterally. Normal muscle tone, no cerebellar symptoms. Sensation intact.  Psych: Awake and oriented X 3, normal affect, Insight  and Judgment appropriate.    Quentin Mulling 4:23 PM

## 2019-07-09 ENCOUNTER — Ambulatory Visit (INDEPENDENT_AMBULATORY_CARE_PROVIDER_SITE_OTHER): Payer: BC Managed Care – PPO | Admitting: Physician Assistant

## 2019-07-09 ENCOUNTER — Other Ambulatory Visit: Payer: Self-pay

## 2019-07-09 ENCOUNTER — Encounter: Payer: Self-pay | Admitting: Physician Assistant

## 2019-07-09 VITALS — BP 128/78 | HR 76 | Temp 97.7°F | Wt 250.0 lb

## 2019-07-09 DIAGNOSIS — R7309 Other abnormal glucose: Secondary | ICD-10-CM

## 2019-07-09 DIAGNOSIS — I10 Essential (primary) hypertension: Secondary | ICD-10-CM | POA: Diagnosis not present

## 2019-07-09 DIAGNOSIS — E0590 Thyrotoxicosis, unspecified without thyrotoxic crisis or storm: Secondary | ICD-10-CM | POA: Diagnosis not present

## 2019-07-09 DIAGNOSIS — Z79899 Other long term (current) drug therapy: Secondary | ICD-10-CM | POA: Diagnosis not present

## 2019-07-09 DIAGNOSIS — E785 Hyperlipidemia, unspecified: Secondary | ICD-10-CM

## 2019-07-09 DIAGNOSIS — E559 Vitamin D deficiency, unspecified: Secondary | ICD-10-CM

## 2019-07-09 DIAGNOSIS — D649 Anemia, unspecified: Secondary | ICD-10-CM

## 2019-07-09 DIAGNOSIS — F3341 Major depressive disorder, recurrent, in partial remission: Secondary | ICD-10-CM

## 2019-07-09 NOTE — Patient Instructions (Addendum)
Call about your knee Phone: [phone]   Check out  Mini habits for weight loss book  2 free apps for tracking food is myfitness pal  loseit  If you want more structured weight loss that you have to pay for, you can look into  Noom or weight watchers   General eating tips  What to Avoid . Avoid added sugars o Often added sugar can be found in processed foods such as many condiments, dry cereals, cakes, cookies, chips, crisps, crackers, candies, sweetened drinks, etc.  o Read labels and AVOID/DECREASE use of foods with the following in their ingredient list: Sugar, fructose, high fructose corn syrup, sucrose, glucose, maltose, dextrose, molasses, cane sugar, brown sugar, any type of syrup, agave nectar, etc.   . Avoid snacking in between meals- drink water or if you feel you need a snack, pick a high water content snack such as cucumbers, watermelon, or any veggie.  Marland Kitchen Avoid foods made with flour o If you are going to eat food made with flour, choose those made with whole-grains; and, minimize your consumption as much as is tolerable . Avoid processed foods o These foods are generally stocked in the middle of the grocery store.  o Focus on shopping on the perimeter of the grocery.  What to Include . Vegetables o GREEN LEAFY VEGETABLES: Kale, spinach, mustard greens, collard greens, cabbage, broccoli, etc. o OTHER: Asparagus, cauliflower, eggplant, carrots, peas, Brussel sprouts, tomatoes, bell peppers, zucchini, beets, cucumbers, etc. . Grains, seeds, and legumes o Beans: kidney beans, black eyed peas, garbanzo beans, black beans, pinto beans, etc. o Whole, unrefined grains: brown rice, barley, bulgur, oatmeal, etc. . Healthy fats  o Avoid highly processed fats such as vegetable oil o Examples of healthy fats: avocado, olives, virgin olive oil, dark chocolate (?72% Cocoa), nuts (peanuts, almonds, walnuts, cashews, pecans, etc.) o Please still do small amount of these healthy fats,  they are dense in calories.  . Low - Moderate Intake of Animal Sources of Protein o Meat sources: chicken, [place], salmon, tuna. Limit to 4 ounces of meat at one time or the size of your palm. o Consider limiting dairy sources, but when choosing dairy focus on: PLAIN [place] yogurt, cottage cheese, high-protein milk . Fruit o Choose berries

## 2019-07-10 LAB — CBC WITH DIFFERENTIAL/PLATELET
Absolute Monocytes: 284 cells/uL (ref 200–950)
Basophils Absolute: 21 cells/uL (ref 0–200)
Basophils Relative: 0.3 %
Eosinophils Absolute: 362 cells/uL (ref 15–500)
Eosinophils Relative: 5.1 %
HCT: 39.7 % (ref 35.0–45.0)
Hemoglobin: 13.6 g/dL (ref 11.7–15.5)
Lymphs Abs: 2890 cells/uL (ref 850–3900)
MCH: 28.3 pg (ref 27.0–33.0)
MCHC: 34.3 g/dL (ref 32.0–36.0)
MCV: 82.5 fL (ref 80.0–100.0)
MPV: 10.4 fL (ref 7.5–12.5)
Monocytes Relative: 4 %
Neutro Abs: 3543 cells/uL (ref 1500–7800)
Neutrophils Relative %: 49.9 %
Platelets: 342 10*3/uL (ref 140–400)
RBC: 4.81 10*6/uL (ref 3.80–5.10)
RDW: 13.3 % (ref 11.0–15.0)
Total Lymphocyte: 40.7 %
WBC: 7.1 10*3/uL (ref 3.8–10.8)

## 2019-07-10 LAB — VITAMIN D 25 HYDROXY (VIT D DEFICIENCY, FRACTURES): Vit D, 25-Hydroxy: 31 ng/mL (ref 30–100)

## 2019-07-10 LAB — HEMOGLOBIN A1C
Hgb A1c MFr Bld: 6.6 % of total Hgb — ABNORMAL HIGH (ref <5.7)
Mean Plasma Glucose: 143 (calc)
eAG (mmol/L): 7.9 (calc)

## 2019-07-10 LAB — COMPLETE METABOLIC PANEL WITH GFR
AG Ratio: 1.4 (calc) (ref 1.0–2.5)
ALT: 16 U/L (ref 6–29)
AST: 16 U/L (ref 10–35)
Albumin: 4.5 g/dL (ref 3.6–5.1)
Alkaline phosphatase (APISO): 73 U/L (ref 37–153)
BUN: 15 mg/dL (ref 7–25)
CO2: 35 mmol/L — ABNORMAL HIGH (ref 20–32)
Calcium: 10.2 mg/dL (ref 8.6–10.4)
Chloride: 99 mmol/L (ref 98–110)
Creat: 0.94 mg/dL (ref 0.50–1.05)
GFR, Est African American: 78 mL/min/{1.73_m2} (ref >=60)
GFR, Est Non African American: 67 mL/min/{1.73_m2} (ref >=60)
Globulin: 3.2 g/dL (calc) (ref 1.9–3.7)
Glucose, Bld: 140 mg/dL — ABNORMAL HIGH (ref 65–99)
Potassium: 3.8 mmol/L (ref 3.5–5.3)
Sodium: 141 mmol/L (ref 135–146)
Total Bilirubin: 0.4 mg/dL (ref 0.2–1.2)
Total Protein: 7.7 g/dL (ref 6.1–8.1)

## 2019-07-10 LAB — LIPID PANEL
Cholesterol: 162 mg/dL (ref <200)
HDL: 54 mg/dL (ref >=50)
LDL Cholesterol (Calc): 89 mg/dL (calc)
Non-HDL Cholesterol (Calc): 108 mg/dL (calc) (ref <130)
Total CHOL/HDL Ratio: 3 (calc) (ref <5.0)
Triglycerides: 101 mg/dL (ref <150)

## 2019-07-10 LAB — MAGNESIUM: Magnesium: 2 mg/dL (ref 1.5–2.5)

## 2019-07-10 LAB — TSH: TSH: 0.37 mIU/L — ABNORMAL LOW (ref 0.40–4.50)

## 2019-07-12 ENCOUNTER — Ambulatory Visit: Payer: BC Managed Care – PPO | Attending: Family

## 2019-07-12 DIAGNOSIS — Z23 Encounter for immunization: Secondary | ICD-10-CM

## 2019-07-12 NOTE — Progress Notes (Signed)
   Covid-19 Vaccination Clinic  Name:  Laura Branch    MRN: [id number] DOB: 1960/09/19  07/12/2019  Ms. [name] was observed post Covid-19 immunization for 15 minutes without incident. She was provided with Vaccine Information Sheet and instruction to access the V-Safe system.   Ms. [name] was instructed to call 911 with any severe reactions post vaccine: Marland Kitchen Difficulty breathing  . Swelling of face and throat  . A fast heartbeat  . A bad rash all over body  . Dizziness and weakness   Immunizations Administered    Name Date Dose VIS Date Route   Moderna COVID-19 Vaccine 07/12/2019 10:11 AM 0.5 mL 04/03/2019 Intramuscular   Manufacturer: Moderna   Lot: [id number]   NDC: 22411-464-31

## 2019-08-14 ENCOUNTER — Ambulatory Visit: Payer: BC Managed Care – PPO | Attending: Family

## 2019-08-14 DIAGNOSIS — Z23 Encounter for immunization: Secondary | ICD-10-CM

## 2019-08-14 NOTE — Progress Notes (Signed)
   Covid-19 Vaccination Clinic  Name:  Laura Branch    MRN: [id number] DOB: January 09, 1961  08/14/2019  Ms. Laura Branch was observed post Covid-19 immunization for 15 minutes without incident. She was provided with Vaccine Information Sheet and instruction to access the V-Safe system.   Ms. Laura Branch was instructed to call 911 with any severe reactions post vaccine: Marland Kitchen Difficulty breathing  . Swelling of face and throat  . A fast heartbeat  . A bad rash all over body  . Dizziness and weakness   Immunizations Administered    Name Date Dose VIS Date Route   Moderna COVID-19 Vaccine 08/14/2019 10:35 AM 0.5 mL 04/03/2019 Intramuscular   Manufacturer: Moderna   Lot: [id number]   NDC: 72182-883-37

## 2019-08-14 NOTE — Progress Notes (Signed)
1 month follow up  Assessment and Plan:  Type 2 diabetes mellitus with hyperlipidemia (HCC) -     fluconazole (DIFLUCAN) 150 MG tablet; Take 1 tablet (150 mg total) by mouth daily. -     dapagliflozin propanediol (FARXIGA) 10 MG TABS tablet; Take 10 mg by mouth daily. Discussed general issues about diabetes pathophysiology and management., Educational material distributed., Suggested low cholesterol diet., Encouraged aerobic exercise., Discussed foot care., Reminded to get yearly retinal exam. Will start on farxiga for weight loss- gave enough samples for 2 months Can cut back on HCTZ and monitor BP Kidney function in 1 month lab only 2 month A1C follow up May add on metformin at that time  Medication management -     COMPLETE METABOLIC PANEL WITH GFR- in 1 month   Discussed med's effects and SE's. Screening labs and tests as requested with regular follow-up as recommended.  Future Appointments  Date Time Provider [place]  01/28/2020  2:00 PM Vicie Mutters, PA-C [facility] None     HPI 59 y.o. female  presents for 1 month follow up for diabetes.   BMI is Body mass index is 41.71 kg/m., she is not working on diet and exercise.  She has lost 7 lbs, she has increased water, increased fruit and less snacking. She is working A LOT, but is trying to walk more then.  Her left knee is doing better.  Wt Readings from Last 3 Encounters:  08/16/19 243 lb (110.2 kg)  07/09/19 250 lb (113.4 kg)  04/05/19 248 lb (112.5 kg)   Her blood pressure has been controlled at work and runs well, she is on enalapril and has been taking HCTZ 1/2 pill, today their BP is BP: 136/74 She does not workout.  She denies chest pain, dizziness.  She is on cholesterol medication, simvastatin 40 and zetia 10 and denies myalgias. Her cholesterol is at goal less than 100 but we may want to set new goal of 70. No FM HX of CAD.  The cholesterol last visit was:   Lab Results  Component Value Date   CHOL  162 07/09/2019   HDL 54 07/09/2019   LDLCALC 89 07/09/2019   TRIG 101 07/09/2019   CHOLHDL 3.0 07/09/2019   She has been working on diet and exercise for prediabetes but last visit was in the DM range.  With hyperlipidemia- she is on zetia and zocor 40 mg max amount.  CKD stage 2 not on ACE or ARB denies paresthesia of the feet, polydipsia and polyuria. She is on bASA.  Last A1C in the office was:  Lab Results  Component Value Date   HGBA1C 6.6 (H) 07/09/2019   Lab Results  Component Value Date   GFRAA 78 07/09/2019    Current Medications:  Current Outpatient Medications on File Prior to Visit  Medication Sig  . albuterol (PROAIR HFA) 108 (90 Base) MCG/ACT inhaler Inhale 2 puffs into the lungs every 6 (six) hours as needed for wheezing or shortness of breath.  Marland Kitchen aspirin 81 MG tablet Take 81 mg by mouth daily.  Marland Kitchen azelastine (ASTELIN) 137 MCG/SPRAY nasal spray Place 2 sprays into both nostrils 2 (two) times daily. Use in each nostril as directed  . cetirizine (ZYRTEC) 10 MG tablet Take 10 mg by mouth daily.  . cholecalciferol (VITAMIN D) 1000 UNITS tablet Take 5,000 Units by mouth daily.  Marland Kitchen ezetimibe (ZETIA) 10 MG tablet Take 1 tablet (10 mg total) by mouth daily.  . hydrochlorothiazide (HYDRODIURIL) 25  MG tablet Take 1 tablet (25 mg total) by mouth daily.  . meloxicam (MOBIC) 15 MG tablet Take one daily with food for 2 weeks, can take with tylenol, can not take with aleve, iburpofen, then as needed daily for pain  . Multiple Vitamins-Minerals (AIRBORNE) CHEW Chew by mouth.  . nystatin cream (MYCOSTATIN) Apply 1 application topically 2 (two) times daily.  . simvastatin (ZOCOR) 40 MG tablet Take 1 tablet (40 mg total) by mouth daily.  Marland Kitchen triamcinolone cream (KENALOG) 0.1 % Apply 1 application topically 2 (two) times daily.   No current facility-administered medications on file prior to visit.    Problem list She has Hypertension; Hyperlipidemia; Depression; Migraines; Vitamin D  deficiency; Abnormal glucose; Morbid obesity (HCC); Medication management; Varicose veins of both lower extremities; Hyperthyroidism; Thyroid nodule; and Anemia on their problem list.   Surgical History: reviewed and unchanged Family History: reviewed and unchanged Social History: reviewed and unchanged  Review of Systems  Constitutional: Negative.   HENT: Negative.   Eyes: Negative.   Respiratory: Negative.   Cardiovascular: Negative.   Gastrointestinal: Negative.   Genitourinary: Negative.   Musculoskeletal: Positive for joint pain. Negative for back pain, falls, myalgias and neck pain.  Skin: Negative.   Neurological: Negative.   Endo/Heme/Allergies: Negative.   Psychiatric/Behavioral: Negative.     Physical Exam: Estimated body mass index is 41.71 kg/m as calculated from the following:   Height as of 12/25/18: 5\' 4"  (1.626 m).   Weight as of this encounter: 243 lb (110.2 kg). BP 136/74   Pulse 64   Temp 97.6 F (36.4 C)   Wt 243 lb (110.2 kg)   SpO2 96%   BMI 41.71 kg/m  Wt Readings from Last 3 Encounters:  08/16/19 243 lb (110.2 kg)  07/09/19 250 lb (113.4 kg)  04/05/19 248 lb (112.5 kg)   General Appearance: Well nourished, in no apparent distress. Eyes: PERRLA, EOMs, conjunctiva no swelling or erythema, normal fundi and vessels. Sinuses: No Frontal/maxillary tenderness ENT/Mouth: Ext aud canals clear, normal light reflex with TMs without erythema, bulging.  Good dentition. No erythema, swelling, or exudate on post pharynx. Tonsils not swollen or erythematous. Hearing normal.  Neck: Supple, thyroid normal. No bruits Respiratory: Respiratory effort normal, BS equal bilaterally without rales, rhonchi, wheezing or stridor. Cardio: RRR with systolic murmur RSB without rubs or gallops. Brisk peripheral pulses without edema.  Chest: symmetric, with normal excursions and percussion. Abdomen: Soft, +BS. Non tender, no guarding, rebound, hernias, masses, or  organomegaly. Lymphatics: Non tender without lymphadenopathy.  Musculoskeletal: Full ROM all peripheral extremities,5/5 strength, and normal gait. Skin: Warm, dry without rashes, lesions, ecchymosis. Neuro: Cranial nerves intact, reflexes equal bilaterally. Normal muscle tone, no cerebellar symptoms. Sensation intact.  Psych: Awake and oriented X 3, normal affect, Insight and Judgment appropriate.    14/03/20 9:07 AM

## 2019-08-16 ENCOUNTER — Other Ambulatory Visit: Payer: Self-pay

## 2019-08-16 ENCOUNTER — Encounter: Payer: Self-pay | Admitting: Physician Assistant

## 2019-08-16 ENCOUNTER — Ambulatory Visit (INDEPENDENT_AMBULATORY_CARE_PROVIDER_SITE_OTHER): Payer: BC Managed Care – PPO | Admitting: Physician Assistant

## 2019-08-16 VITALS — BP 136/74 | HR 64 | Temp 97.6°F | Wt 243.0 lb

## 2019-08-16 DIAGNOSIS — E1169 Type 2 diabetes mellitus with other specified complication: Secondary | ICD-10-CM

## 2019-08-16 DIAGNOSIS — Z79899 Other long term (current) drug therapy: Secondary | ICD-10-CM

## 2019-08-16 DIAGNOSIS — E785 Hyperlipidemia, unspecified: Secondary | ICD-10-CM | POA: Diagnosis not present

## 2019-08-16 MED ORDER — FLUCONAZOLE 150 MG PO TABS: 150.0000 mg | ORAL_TABLET | Freq: Every day | ORAL | 3 refills | Status: DC

## 2019-08-16 MED ORDER — FARXIGA 10 MG PO TABS: 10.0000 mg | ORAL_TABLET | Freq: Every day | ORAL | 3 refills | Status: DC

## 2019-08-16 NOTE — Patient Instructions (Addendum)
STARTING A DIABETIC MEDICATION  The medication we are going to start you on is a sodium-glucose cotransporter-2 (SGLT2) inhibitors for your diabetes and weight.    This can decrease your blood pressure so please contact us if you have any dizziness, sometime we need to decrease or stop fluid pills or decrease BP meds.  JUST GO AHEAD AND START TAKING THE FLUID PILL EVERY OTHER DAY AND WHEN YOU GO UP TO THE 10 MG MAY BE ABLE TO STOP MONITOR BP CLOSELY  You are peeing out 300-400 calories a day of sugar which can lead to yeast infections, you can take the diflucan as needed but if the infections continue we will stop the medications.  If you get any pain or discomfort around your buttocks or genitals please let us know if it does not go away. We will have you follow up in 1 month to check your kidney function and weight. Call if you need anything.   RANGE OF A1C   Your A1C is a measure of your sugar over the past 3 months and is not affected by what you have eaten over the past few days. Diabetes increases your chances of stroke and heart attack over 300 % and is the leading cause of blindness and kidney failure in the [place]. Please make sure you decrease bad carbs like white bread, white rice, potatoes, corn, soft drinks, pasta, cereals, refined sugars, sweet tea, dried fruits, and fruit juice. Good carbs are okay to eat in moderation like sweet potatoes, brown rice, whole grain pasta/bread, most fruit (except dried fruit) and you can eat as many veggies as you want.   Greater than 6.5 is considered diabetic. Between 6.4 and 5.7 is prediabetic If your A1C is less than 5.7 you are NOT diabetic.  Targets for Glucose Readings: Time of Check Target for patients WITHOUT Diabetes Target for DIABETICS  Before Meals Less than 100  less than 150  Two hours after meals Less than 200  Less than 250   General eating tips  What to Avoid . Avoid added sugars o Often added sugar can be found in  processed foods such as many condiments, dry cereals, cakes, cookies, chips, crisps, crackers, candies, sweetened drinks, etc.  o Read labels and AVOID/DECREASE use of foods with the following in their ingredient list: Sugar, fructose, high fructose corn syrup, sucrose, glucose, maltose, dextrose, molasses, cane sugar, brown sugar, any type of syrup, agave nectar, etc.   . Avoid snacking in between meals- drink water or if you feel you need a snack, pick a high water content snack such as cucumbers, watermelon, or any veggie.  Marland Kitchen Avoid foods made with flour o If you are going to eat food made with flour, choose those made with whole-grains; and, minimize your consumption as much as is tolerable . Avoid processed foods o These foods are generally stocked in the middle of the grocery store.  o Focus on shopping on the perimeter of the grocery.  What to Include . Vegetables o GREEN LEAFY VEGETABLES: Kale, spinach, mustard greens, collard greens, cabbage, broccoli, etc. o OTHER: Asparagus, cauliflower, eggplant, carrots, peas, Brussel sprouts, tomatoes, bell peppers, zucchini, beets, cucumbers, etc. . Grains, seeds, and legumes o Beans: kidney beans, black eyed peas, garbanzo beans, black beans, pinto beans, etc. o Whole, unrefined grains: brown rice, barley, bulgur, oatmeal, etc. . Healthy fats  o Avoid highly processed fats such as vegetable oil o Examples of healthy fats: avocado, olives, virgin olive oil,  dark chocolate (?72% Cocoa), nuts (peanuts, almonds, walnuts, cashews, pecans, etc.) o Please still do small amount of these healthy fats, they are dense in calories.  . Low - Moderate Intake of Animal Sources of Protein o Meat sources: chicken, [place], salmon, tuna. Limit to 4 ounces of meat at one time or the size of your palm. o Consider limiting dairy sources, but when choosing dairy focus on: PLAIN [place] yogurt, cottage cheese, high-protein milk . Fruit o Choose berries   HYPERTENSION  INFORMATION  Monitor your blood pressure at home, please keep a record and bring that in with you to your next office visit.   Go to the ER if any CP, SOB, nausea, dizziness, severe HA, changes vision/speech  Testing/Procedures: HOW TO TAKE YOUR BLOOD PRESSURE:  Rest 5 minutes before taking your blood pressure.  Don't smoke or drink caffeinated beverages for at least 30 minutes before.  Take your blood pressure before (not after) you eat.  Sit comfortably with your back supported and both feet on the floor (don't cross your legs).  Elevate your arm to heart level on a table or a desk.  Use the proper sized cuff. It should fit smoothly and snugly around your bare upper arm. There should be enough room to slip a fingertip under the cuff. The bottom edge of the cuff should be 1 inch above the crease of the elbow.  Due to a recent study, SPRINT, we have changed our goal for the systolic or top blood pressure number. Ideally we want your top number at 120.  In the [facility] Trial, 5000 people were randomized to a goal BP of 120 and 5000 people were randomized to a goal BP of less than 140. The patients with the goal BP at 120 had LESS DEMENTIA, LESS HEART ATTACKS, AND LESS STROKES, AS WELL AS OVERALL DECREASED MORTALITY OR DEATH RATE.   There was another study that showed taking your blood pressure medications at night decrease cardiovascular events.  However if you are on a fluid pill, please take this in the morning.   If you are willing, our goal BP is the top number of 120.  Your most recent BP: BP: 136/74   Take your medications faithfully as instructed. Maintain a healthy weight. Get at least 150 minutes of aerobic exercise per week. Minimize salt intake. Minimize alcohol intake  DASH Eating Plan DASH stands for "Dietary Approaches to Stop Hypertension." The DASH eating plan is a healthy eating plan that has been shown to reduce high blood pressure (hypertension). Additional health  benefits may include reducing the risk of type 2 diabetes mellitus, heart disease, and stroke. The DASH eating plan may also help with weight loss. WHAT DO I NEED TO KNOW ABOUT THE DASH EATING PLAN? For the DASH eating plan, you will follow these general guidelines:  Choose foods with a percent daily value for sodium of less than 5% (as listed on the food label).  Use salt-free seasonings or herbs instead of table salt or sea salt.  Check with your health care provider or pharmacist before using salt substitutes.  Eat lower-sodium products, often labeled as "lower sodium" or "no salt added."  Eat fresh foods.  Eat more vegetables, fruits, and low-fat dairy products.  Choose whole grains. Look for the word "whole" as the first word in the ingredient list.  Choose fish and skinless chicken or [place] more often than red meat. Limit fish, poultry, and meat to 6 oz (170 g) each day.  Limit sweets, desserts, sugars, and sugary drinks.  Choose heart-healthy fats.  Limit cheese to 1 oz (28 g) per day.  Eat more home-cooked food and less restaurant, buffet, and fast food.  Limit fried foods.  Cook foods using methods other than frying.  Limit canned vegetables. If you do use them, rinse them well to decrease the sodium.  When eating at a restaurant, ask that your food be prepared with less salt, or no salt if possible. WHAT FOODS CAN I EAT? Seek help from a dietitian for individual calorie needs. Grains Whole grain or whole wheat bread. Brown rice. Whole grain or whole wheat pasta. Quinoa, bulgur, and whole grain cereals. Low-sodium cereals. Corn or whole wheat flour tortillas. Whole grain cornbread. Whole grain crackers. Low-sodium crackers. Vegetables Fresh or frozen vegetables (raw, steamed, roasted, or grilled). Low-sodium or reduced-sodium tomato and vegetable juices. Low-sodium or reduced-sodium tomato sauce and paste. Low-sodium or reduced-sodium canned vegetables.  Fruits All  fresh, canned (in natural juice), or frozen fruits. Meat and Other Protein Products Ground beef (85% or leaner), grass-fed beef, or beef trimmed of fat. Skinless chicken or [place]. Ground chicken or [place]. Pork trimmed of fat. All fish and seafood. Eggs. Dried beans, peas, or lentils. Unsalted nuts and seeds. Unsalted canned beans. Dairy Low-fat dairy products, such as skim or 1% milk, 2% or reduced-fat cheeses, low-fat ricotta or cottage cheese, or plain low-fat yogurt. Low-sodium or reduced-sodium cheeses. Fats and Oils Tub margarines without trans fats. Light or reduced-fat mayonnaise and salad dressings (reduced sodium). Avocado. Safflower, olive, or canola oils. Natural peanut or almond butter. Other Unsalted popcorn and pretzels. The items listed above may not be a complete list of recommended foods or beverages. Contact your dietitian for more options. WHAT FOODS ARE NOT RECOMMENDED? Grains White bread. White pasta. White rice. Refined cornbread. Bagels and croissants. Crackers that contain trans fat. Vegetables Creamed or fried vegetables. Vegetables in a cheese sauce. Regular canned vegetables. Regular canned tomato sauce and paste. Regular tomato and vegetable juices. Fruits Dried fruits. Canned fruit in light or heavy syrup. Fruit juice. Meat and Other Protein Products Fatty cuts of meat. Ribs, chicken wings, bacon, sausage, bologna, salami, chitterlings, fatback, hot dogs, bratwurst, and packaged luncheon meats. Salted nuts and seeds. Canned beans with salt. Dairy Whole or 2% milk, cream, half-and-half, and cream cheese. Whole-fat or sweetened yogurt. Full-fat cheeses or blue cheese. Nondairy creamers and whipped toppings. Processed cheese, cheese spreads, or cheese curds. Condiments Onion and garlic salt, seasoned salt, table salt, and sea salt. Canned and packaged gravies. Worcestershire sauce. Tartar sauce. Barbecue sauce. Teriyaki sauce. Soy sauce, including reduced sodium.  Steak sauce. Fish sauce. Oyster sauce. Cocktail sauce. Horseradish. Ketchup and mustard. Meat flavorings and tenderizers. Bouillon cubes. Hot sauce. Tabasco sauce. Marinades. Taco seasonings. Relishes. Fats and Oils Butter, stick margarine, lard, shortening, ghee, and bacon fat. Coconut, palm kernel, or palm oils. Regular salad dressings. Other Pickles and olives. Salted popcorn and pretzels. The items listed above may not be a complete list of foods and beverages to avoid. Contact your dietitian for more information. WHERE CAN I FIND MORE INFORMATION? National Heart, Lung, and Blood Institute: travelstabloid.com Document Released: 04/08/2011 Document Revised: 09/03/2013 Document Reviewed: 02/21/2013 [facility] Patient Information 2015 [place], [place]. This information is not intended to replace advice given to you by your health care provider. Make sure you discuss any questions you have with your health care provider.

## 2019-08-27 ENCOUNTER — Other Ambulatory Visit: Payer: Self-pay | Admitting: Physician Assistant

## 2019-08-27 DIAGNOSIS — I10 Essential (primary) hypertension: Secondary | ICD-10-CM

## 2019-09-13 ENCOUNTER — Other Ambulatory Visit: Payer: Self-pay

## 2019-09-13 ENCOUNTER — Other Ambulatory Visit (INDEPENDENT_AMBULATORY_CARE_PROVIDER_SITE_OTHER): Payer: BC Managed Care – PPO

## 2019-09-13 DIAGNOSIS — Z79899 Other long term (current) drug therapy: Secondary | ICD-10-CM

## 2019-09-13 DIAGNOSIS — E0590 Thyrotoxicosis, unspecified without thyrotoxic crisis or storm: Secondary | ICD-10-CM

## 2019-09-14 LAB — COMPLETE METABOLIC PANEL WITH GFR
AG Ratio: 1.4 (calc) (ref 1.0–2.5)
ALT: 15 U/L (ref 6–29)
AST: 14 U/L (ref 10–35)
Albumin: 4.2 g/dL (ref 3.6–5.1)
Alkaline phosphatase (APISO): 71 U/L (ref 37–153)
BUN/Creatinine Ratio: 13 (calc) (ref 6–22)
BUN: 14 mg/dL (ref 7–25)
CO2: 29 mmol/L (ref 20–32)
Calcium: 9.5 mg/dL (ref 8.6–10.4)
Chloride: 106 mmol/L (ref 98–110)
Creat: 1.08 mg/dL — ABNORMAL HIGH (ref 0.50–1.05)
GFR, Est African American: 66 mL/min/{1.73_m2} (ref >=60)
GFR, Est Non African American: 57 mL/min/{1.73_m2} — ABNORMAL LOW (ref >=60)
Globulin: 3 g/dL (calc) (ref 1.9–3.7)
Glucose, Bld: 104 mg/dL — ABNORMAL HIGH (ref 65–99)
Potassium: 4.2 mmol/L (ref 3.5–5.3)
Sodium: 142 mmol/L (ref 135–146)
Total Bilirubin: 0.4 mg/dL (ref 0.2–1.2)
Total Protein: 7.2 g/dL (ref 6.1–8.1)

## 2019-09-14 LAB — TSH: TSH: 0.78 mIU/L (ref 0.40–4.50)

## 2019-10-15 ENCOUNTER — Encounter: Payer: Self-pay | Admitting: Physician Assistant

## 2019-10-15 DIAGNOSIS — E1169 Type 2 diabetes mellitus with other specified complication: Secondary | ICD-10-CM

## 2019-10-15 HISTORY — DX: Type 2 diabetes mellitus with other specified complication: E11.69

## 2019-10-15 NOTE — Progress Notes (Signed)
3 month follow up  Assessment and Plan: Essential hypertension -     CBC with Differential/Platelet -     COMPLETE METABOLIC PANEL WITH GFR -     TSH - continue medications, DASH diet, exercise and monitor at home. Call if greater than 130/80.   Hyperthyroidism -     TSH Hypothyroidism-check TSH level, continue medications the same, reminded to take on an empty stomach 30-85mins before food.   Morbid obesity (HCC) - follow up 3 months for progress monitoring - increase veggies, decrease carbs - long discussion about weight loss, diet, and exercise  Medication management -     Magnesium  Anemia, unspecified type Monitor  Type 2 diabetes mellitus with hyperlipidemia (HCC) -     Lipid panel -     Hemoglobin A1c Has lost 9 lbs, would prefer to keep doing diet and stop the farxiga for the time being if her A1C is better Follow up 3 months  Vitamin D deficiency -     VITAMIN D 25 Hydroxy (Vit-D Deficiency, Fractures)   Discussed med's effects and SE's. Screening labs and tests as requested with regular follow-up as recommended.  Future Appointments  Date Time Provider Department Center  01/28/2020  2:00 PM Quentin Mulling, PA-C [facility] None     HPI 59 y.o. female  presents for follow up for HTN, chol, hyperthyroidism, preDM, morbid obesity.   BMI is Body mass index is 41.37 kg/m., she is not working on diet and exercise. She stopped drinking soda,  She has occ left knee pain, states she is not walking as much due to that, she takes tylenol but has stopped taking aleve.  Has seen murphy wainer in the past.  Wt Readings from Last 3 Encounters:  10/17/19 241 lb (109.3 kg)  08/16/19 243 lb (110.2 kg)  07/09/19 250 lb (113.4 kg)   Her blood pressure has been controlled at work and runs well, she was taken off HCTZ when put on farxiga, today their BP is BP: 128/78 She does not workout.  She denies chest pain, dizziness.  She is on cholesterol medication, simvastatin 40  and zetia 10 and denies myalgias. Her cholesterol is at goal less than 100 but we may want to set new goal of 70. No FM HX of CAD.  The cholesterol last visit was:   Lab Results  Component Value Date   CHOL 162 07/09/2019   HDL 54 07/09/2019   LDLCALC 89 07/09/2019   TRIG 101 07/09/2019   CHOLHDL 3.0 07/09/2019   She has been working on diet and exercise for diabetes With hyperlipidemia- she is on zetia and zocor 40 mg max amount.  CKD stage 2 not on ACE or ARB STARTED ON FARXIGA LAST VISIT She did take a diflucan once denies paresthesia of the feet, polydipsia and polyuria.  She is on bASA.  Last A1C in the office was:  Lab Results  Component Value Date   HGBA1C 6.6 (H) 07/09/2019   Lab Results  Component Value Date   GFRAA 66 09/13/2019   Patient is on Vitamin D supplement, 5000 daily.  Lab Results  Component Value Date   VD25OH 31 07/09/2019   She has had hyperthyroidism, she has had normal RAI uptake and negative thyroid BX.  Lab Results  Component Value Date   TSH 0.78 09/13/2019   Current Medications:   Current Outpatient Medications (Endocrine & Metabolic):  .  dapagliflozin propanediol (FARXIGA) 10 MG TABS tablet, Take 10 mg by mouth  daily.  Current Outpatient Medications (Cardiovascular):  .  ezetimibe (ZETIA) 10 MG tablet, Take 1 tablet (10 mg total) by mouth daily. .  simvastatin (ZOCOR) 40 MG tablet, Take 1 tablet (40 mg total) by mouth daily.  Current Outpatient Medications (Respiratory):  .  albuterol (PROAIR HFA) 108 (90 Base) MCG/ACT inhaler, Inhale 2 puffs into the lungs every 6 (six) hours as needed for wheezing or shortness of breath. Marland Kitchen  azelastine (ASTELIN) 137 MCG/SPRAY nasal spray, Place 2 sprays into both nostrils 2 (two) times daily. Use in each nostril as directed .  cetirizine (ZYRTEC) 10 MG tablet, Take 10 mg by mouth daily.  Current Outpatient Medications (Analgesics):  .  aspirin 81 MG tablet, Take 81 mg by mouth daily. .  meloxicam  (MOBIC) 15 MG tablet, Take one daily with food for 2 weeks, can take with tylenol, can not take with aleve, iburpofen, then as needed daily for pain   Current Outpatient Medications (Other):  .  cholecalciferol (VITAMIN D) 1000 UNITS tablet, Take 5,000 Units by mouth daily. .  Multiple Vitamins-Minerals (AIRBORNE) CHEW, Chew by mouth. .  nystatin cream (MYCOSTATIN), Apply 1 application topically 2 (two) times daily. Marland Kitchen  triamcinolone cream (KENALOG) 0.1 %, Apply 1 application topically 2 (two) times daily.  Problem list She has Hypertension; Hyperlipidemia; Depression; Migraines; Vitamin D deficiency; Abnormal glucose; Morbid obesity (HCC); Medication management; Varicose veins of both lower extremities; Hyperthyroidism; Thyroid nodule; Anemia; and Type 2 diabetes mellitus with hyperlipidemia (HCC) on their problem list.   A SURGICAL HISTORY She  has a past surgical history that includes Abdominal hysterectomy and Stapedectomy (Right, 2000). FAMILY HISTORY Her family history includes Arthritis in her mother; Asthma in her mother; Cancer (age of onset: 57) in her maternal grandfather; Cancer (age of onset: 71) in her maternal grandmother; Diabetes in her daughter and father; Hyperlipidemia in her father; Hypertension in her father, mother, and sister. SOCIAL HISTORY She  reports that she has never smoked. She has never used smokeless tobacco. She reports that she does not drink alcohol and does not use drugs.  Review of Systems  Constitutional: Negative.   HENT: Negative.   Eyes: Negative.   Respiratory: Negative.   Cardiovascular: Negative.   Gastrointestinal: Negative.   Genitourinary: Negative.   Musculoskeletal: Positive for joint pain. Negative for back pain, falls, myalgias and neck pain.  Skin: Negative.   Neurological: Negative.   Endo/Heme/Allergies: Negative.   Psychiatric/Behavioral: Negative.     Physical Exam: Estimated body mass index is 41.37 kg/m as calculated from  the following:   Height as of 12/25/18: 5\' 4"  (1.626 m).   Weight as of this encounter: 241 lb (109.3 kg). BP 128/78   Pulse 100   Temp 98.4 F (36.9 C)   Wt 241 lb (109.3 kg)   SpO2 94%   BMI 41.37 kg/m  Wt Readings from Last 3 Encounters:  10/17/19 241 lb (109.3 kg)  08/16/19 243 lb (110.2 kg)  07/09/19 250 lb (113.4 kg)   General Appearance: Well nourished, in no apparent distress. Eyes: PERRLA, EOMs, conjunctiva no swelling or erythema, normal fundi and vessels. Sinuses: No Frontal/maxillary tenderness ENT/Mouth: Ext aud canals clear, normal light reflex with TMs without erythema, bulging.  Good dentition. No erythema, swelling, or exudate on post pharynx. Tonsils not swollen or erythematous. Hearing normal.  Neck: Supple, thyroid normal. No bruits Respiratory: Respiratory effort normal, BS equal bilaterally without rales, rhonchi, wheezing or stridor. Cardio: RRR with systolic murmur RSB without rubs or gallops.  Brisk peripheral pulses without edema.  Chest: symmetric, with normal excursions and percussion. Abdomen: Soft, +BS. Non tender, no guarding, rebound, hernias, masses, or organomegaly. Lymphatics: Non tender without lymphadenopathy.  Musculoskeletal: Full ROM all peripheral extremities,5/5 strength, and normal gait. Skin: Warm, dry without rashes, lesions, ecchymosis. Neuro: Cranial nerves intact, reflexes equal bilaterally. Normal muscle tone, no cerebellar symptoms. Sensation intact.  Psych: Awake and oriented X 3, normal affect, Insight and Judgment appropriate.    Vicie Mutters 9:50 AM

## 2019-10-17 ENCOUNTER — Other Ambulatory Visit: Payer: Self-pay

## 2019-10-17 ENCOUNTER — Encounter: Payer: Self-pay | Admitting: Physician Assistant

## 2019-10-17 ENCOUNTER — Ambulatory Visit: Payer: BC Managed Care – PPO | Admitting: Physician Assistant

## 2019-10-17 VITALS — BP 128/78 | HR 100 | Temp 98.4°F | Wt 241.0 lb

## 2019-10-17 DIAGNOSIS — I10 Essential (primary) hypertension: Secondary | ICD-10-CM

## 2019-10-17 DIAGNOSIS — E785 Hyperlipidemia, unspecified: Secondary | ICD-10-CM | POA: Diagnosis not present

## 2019-10-17 DIAGNOSIS — Z79899 Other long term (current) drug therapy: Secondary | ICD-10-CM | POA: Diagnosis not present

## 2019-10-17 DIAGNOSIS — E1169 Type 2 diabetes mellitus with other specified complication: Secondary | ICD-10-CM

## 2019-10-17 DIAGNOSIS — E559 Vitamin D deficiency, unspecified: Secondary | ICD-10-CM

## 2019-10-17 DIAGNOSIS — D649 Anemia, unspecified: Secondary | ICD-10-CM

## 2019-10-17 DIAGNOSIS — E0590 Thyrotoxicosis, unspecified without thyrotoxic crisis or storm: Secondary | ICD-10-CM

## 2019-10-17 NOTE — Patient Instructions (Signed)
General eating tips  What to Avoid . Avoid added sugars o Often added sugar can be found in processed foods such as many condiments, dry cereals, cakes, cookies, chips, crisps, crackers, candies, sweetened drinks, etc.  o Read labels and AVOID/DECREASE use of foods with the following in their ingredient list: Sugar, fructose, high fructose corn syrup, sucrose, glucose, maltose, dextrose, molasses, cane sugar, brown sugar, any type of syrup, agave nectar, etc.   . Avoid snacking in between meals- drink water or if you feel you need a snack, pick a high water content snack such as cucumbers, watermelon, or any veggie.  Marland Kitchen Avoid foods made with flour o If you are going to eat food made with flour, choose those made with whole-grains; and, minimize your consumption as much as is tolerable . Avoid processed foods o These foods are generally stocked in the middle of the grocery store.  o Focus on shopping on the perimeter of the grocery.  What to Include . Vegetables o GREEN LEAFY VEGETABLES: Kale, spinach, mustard greens, collard greens, cabbage, broccoli, etc. o OTHER: Asparagus, cauliflower, eggplant, carrots, peas, Brussel sprouts, tomatoes, bell peppers, zucchini, beets, cucumbers, etc. . Grains, seeds, and legumes o Beans: kidney beans, black eyed peas, garbanzo beans, black beans, pinto beans, etc. o Whole, unrefined grains: brown rice, barley, bulgur, oatmeal, etc. . Healthy fats  o Avoid highly processed fats such as vegetable oil o Examples of healthy fats: avocado, olives, virgin olive oil, dark chocolate (?72% Cocoa), nuts (peanuts, almonds, walnuts, cashews, pecans, etc.) o Please still do small amount of these healthy fats, they are dense in calories.  . Low - Moderate Intake of Animal Sources of Protein o Meat sources: chicken, [place], salmon, tuna. Limit to 4 ounces of meat at one time or the size of your palm. o Consider limiting dairy sources, but when choosing dairy focus on:  PLAIN [place] yogurt, cottage cheese, high-protein milk . Fruit o Choose berries    Google mindful eating and here are some tips and tricks below.   Also if you are going to have a night time, after dinner snack.  Have it planned out. Know what you are going to eat.   If you even find yourself looking in the pantry or the fridge, just looking- you are looking for comfort- drink some water, go for a walk, call a friend, try some fruit.   Rate your hunger before you eat on a scale of 1-10, try to eat closer to a 6 or higher. And if you are at below that, why are you eating? Slow down and listen to your body.

## 2019-10-18 LAB — HEMOGLOBIN A1C
Hgb A1c MFr Bld: 6 % of total Hgb — ABNORMAL HIGH (ref <5.7)
Mean Plasma Glucose: 126 (calc)
eAG (mmol/L): 7 (calc)

## 2019-10-18 LAB — CBC WITH DIFFERENTIAL/PLATELET
Absolute Monocytes: 293 cells/uL (ref 200–950)
Basophils Absolute: 29 cells/uL (ref 0–200)
Basophils Relative: 0.6 %
Eosinophils Absolute: 274 cells/uL (ref 15–500)
Eosinophils Relative: 5.7 %
HCT: 42.8 % (ref 35.0–45.0)
Hemoglobin: 14.1 g/dL (ref 11.7–15.5)
Lymphs Abs: 1627 cells/uL (ref 850–3900)
MCH: 27.6 pg (ref 27.0–33.0)
MCHC: 32.9 g/dL (ref 32.0–36.0)
MCV: 83.8 fL (ref 80.0–100.0)
MPV: 10.4 fL (ref 7.5–12.5)
Monocytes Relative: 6.1 %
Neutro Abs: 2578 cells/uL (ref 1500–7800)
Neutrophils Relative %: 53.7 %
Platelets: 309 10*3/uL (ref 140–400)
RBC: 5.11 10*6/uL — ABNORMAL HIGH (ref 3.80–5.10)
RDW: 13.8 % (ref 11.0–15.0)
Total Lymphocyte: 33.9 %
WBC: 4.8 10*3/uL (ref 3.8–10.8)

## 2019-10-18 LAB — COMPLETE METABOLIC PANEL WITH GFR
AG Ratio: 1.3 (calc) (ref 1.0–2.5)
ALT: 12 U/L (ref 6–29)
AST: 11 U/L (ref 10–35)
Albumin: 4.3 g/dL (ref 3.6–5.1)
Alkaline phosphatase (APISO): 76 U/L (ref 37–153)
BUN: 13 mg/dL (ref 7–25)
CO2: 25 mmol/L (ref 20–32)
Calcium: 9.8 mg/dL (ref 8.6–10.4)
Chloride: 106 mmol/L (ref 98–110)
Creat: 1.01 mg/dL (ref 0.50–1.05)
GFR, Est African American: 72 mL/min/{1.73_m2} (ref >=60)
GFR, Est Non African American: 62 mL/min/{1.73_m2} (ref >=60)
Globulin: 3.2 g/dL (calc) (ref 1.9–3.7)
Glucose, Bld: 106 mg/dL — ABNORMAL HIGH (ref 65–99)
Potassium: 4.3 mmol/L (ref 3.5–5.3)
Sodium: 140 mmol/L (ref 135–146)
Total Bilirubin: 0.4 mg/dL (ref 0.2–1.2)
Total Protein: 7.5 g/dL (ref 6.1–8.1)

## 2019-10-18 LAB — LIPID PANEL
Cholesterol: 148 mg/dL (ref <200)
HDL: 52 mg/dL (ref >=50)
LDL Cholesterol (Calc): 81 mg/dL (calc)
Non-HDL Cholesterol (Calc): 96 mg/dL (calc) (ref <130)
Total CHOL/HDL Ratio: 2.8 (calc) (ref <5.0)
Triglycerides: 74 mg/dL (ref <150)

## 2019-10-18 LAB — MAGNESIUM: Magnesium: 2 mg/dL (ref 1.5–2.5)

## 2019-10-18 LAB — TSH: TSH: 0.51 mIU/L (ref 0.40–4.50)

## 2019-10-18 LAB — VITAMIN D 25 HYDROXY (VIT D DEFICIENCY, FRACTURES): Vit D, 25-Hydroxy: 69 ng/mL (ref 30–100)

## 2019-11-22 ENCOUNTER — Other Ambulatory Visit: Payer: Self-pay | Admitting: Physician Assistant

## 2019-11-22 DIAGNOSIS — E785 Hyperlipidemia, unspecified: Secondary | ICD-10-CM

## 2019-12-12 ENCOUNTER — Other Ambulatory Visit: Payer: Self-pay | Admitting: Physician Assistant

## 2019-12-12 DIAGNOSIS — I10 Essential (primary) hypertension: Secondary | ICD-10-CM

## 2020-01-28 ENCOUNTER — Ambulatory Visit (INDEPENDENT_AMBULATORY_CARE_PROVIDER_SITE_OTHER): Payer: BC Managed Care – PPO | Admitting: Physician Assistant

## 2020-01-28 ENCOUNTER — Other Ambulatory Visit: Payer: Self-pay

## 2020-01-28 ENCOUNTER — Encounter: Payer: Self-pay | Admitting: Physician Assistant

## 2020-01-28 VITALS — BP 136/82 | HR 63 | Temp 97.3°F | Ht 64.0 in | Wt 243.0 lb

## 2020-01-28 DIAGNOSIS — Z131 Encounter for screening for diabetes mellitus: Secondary | ICD-10-CM

## 2020-01-28 DIAGNOSIS — E559 Vitamin D deficiency, unspecified: Secondary | ICD-10-CM

## 2020-01-28 DIAGNOSIS — E1169 Type 2 diabetes mellitus with other specified complication: Secondary | ICD-10-CM

## 2020-01-28 DIAGNOSIS — Z0000 Encounter for general adult medical examination without abnormal findings: Secondary | ICD-10-CM

## 2020-01-28 DIAGNOSIS — G43809 Other migraine, not intractable, without status migrainosus: Secondary | ICD-10-CM

## 2020-01-28 DIAGNOSIS — Z13220 Encounter for screening for lipoid disorders: Secondary | ICD-10-CM | POA: Diagnosis not present

## 2020-01-28 DIAGNOSIS — Z79899 Other long term (current) drug therapy: Secondary | ICD-10-CM

## 2020-01-28 DIAGNOSIS — I8393 Asymptomatic varicose veins of bilateral lower extremities: Secondary | ICD-10-CM

## 2020-01-28 DIAGNOSIS — Z1329 Encounter for screening for other suspected endocrine disorder: Secondary | ICD-10-CM | POA: Diagnosis not present

## 2020-01-28 DIAGNOSIS — Z130 Encounter for screening for diseases of the blood and blood-forming organs and certain disorders involving the immune mechanism: Secondary | ICD-10-CM | POA: Diagnosis not present

## 2020-01-28 DIAGNOSIS — Z0001 Encounter for general adult medical examination with abnormal findings: Secondary | ICD-10-CM

## 2020-01-28 DIAGNOSIS — Z136 Encounter for screening for cardiovascular disorders: Secondary | ICD-10-CM | POA: Diagnosis not present

## 2020-01-28 DIAGNOSIS — F3341 Major depressive disorder, recurrent, in partial remission: Secondary | ICD-10-CM

## 2020-01-28 DIAGNOSIS — E0590 Thyrotoxicosis, unspecified without thyrotoxic crisis or storm: Secondary | ICD-10-CM

## 2020-01-28 DIAGNOSIS — Z1389 Encounter for screening for other disorder: Secondary | ICD-10-CM | POA: Diagnosis not present

## 2020-01-28 DIAGNOSIS — D649 Anemia, unspecified: Secondary | ICD-10-CM

## 2020-01-28 DIAGNOSIS — F329 Major depressive disorder, single episode, unspecified: Secondary | ICD-10-CM

## 2020-01-28 DIAGNOSIS — E041 Nontoxic single thyroid nodule: Secondary | ICD-10-CM

## 2020-01-28 DIAGNOSIS — I10 Essential (primary) hypertension: Secondary | ICD-10-CM | POA: Diagnosis not present

## 2020-01-28 MED ORDER — BUPROPION HCL ER (XL) 150 MG PO TB24: 150.0000 mg | ORAL_TABLET | ORAL | 1 refills | Status: DC

## 2020-01-28 NOTE — Patient Instructions (Addendum)
Will start you on wellbutrin to try to help with mood/weight loss If you get nausea and HA after the first week please stop If you get anxious or snappy with people than stop the medication But this medication can help with energy, weight loss and mood It kicks in about 1-2 weeks And can be stopped quickly   There are some great apps too  Check out GTHX, give thanks app.  Meditations apps are great like headspace.    Going to refer for colonoscopy- CALL Dr. Kinnie Scales Phone: [phone]  Colon cancer is 3rd most diagnosed cancer and 2nd leading cause of death in both men and women 38 years of age and older despite being one of the most preventable and treatable cancers if found early.  4 of out 5 people diagnosed with colon cancer have NO prior family history.  When caught EARLY 90% of colon cancer is curable.    Can do a steroid nasal spary 1-2 sparys at night each nostril.  Examples are nasonex, flonase, nasocort- they are over the counter.  Remember to spray each nostril twice towards the outer part of your eye.   Do not sniff but instead pinch your nose and tilt your head back to help the medicine get into your sinuses.   The best time to do this is at bedtime.  Stop if you get blurred vision or nose bleeds.   THIS WILL TAKE 7 DAYS TO WORK AND IS BETTER IF YOU START BEFORE SYMPTOMS SO IF YOU HAVE A SEASON OR TIME OF THE YEAR YOU ALWAYS GET A COLD, START BEFORE THAT!   General eating tips  What to Avoid  Avoid added sugars o Often added sugar can be found in processed foods such as many condiments, dry cereals, cakes, cookies, chips, crisps, crackers, candies, sweetened drinks, etc.  o Read labels and AVOID/DECREASE use of foods with the following in their ingredient list: Sugar, fructose, high fructose corn syrup, sucrose, glucose, maltose, dextrose, molasses, cane sugar, brown sugar, any type of syrup, agave nectar, etc.    Avoid snacking in between meals- drink water or if  you feel you need a snack, pick a high water content snack such as cucumbers, watermelon, or any veggie.   Avoid foods made with flour o If you are going to eat food made with flour, choose those made with whole-grains; and, minimize your consumption as much as is tolerable  Avoid processed foods o These foods are generally stocked in the middle of the grocery store.  o Focus on shopping on the perimeter of the grocery.  What to Include  Vegetables o GREEN LEAFY VEGETABLES: Kale, spinach, mustard greens, collard greens, cabbage, broccoli, etc. o OTHER: Asparagus, cauliflower, eggplant, carrots, peas, Brussel sprouts, tomatoes, bell peppers, zucchini, beets, cucumbers, etc.  Grains, seeds, and legumes o Beans: kidney beans, black eyed peas, garbanzo beans, black beans, pinto beans, etc. o Whole, unrefined grains: brown rice, barley, bulgur, oatmeal, etc.  Healthy fats  o Avoid highly processed fats such as vegetable oil o Examples of healthy fats: avocado, olives, virgin olive oil, dark chocolate (?72% Cocoa), nuts (peanuts, almonds, walnuts, cashews, pecans, etc.) o Please still do small amount of these healthy fats, they are dense in calories.   Low - Moderate Intake of Animal Sources of Protein o Meat sources: chicken, [place], salmon, tuna. Limit to 4 ounces of meat at one time or the size of your palm. o Consider limiting dairy sources, but when choosing dairy focus  on: PLAIN Greek yogurt, cottage cheese, high-protein milk  Fruit o Choose berries

## 2020-01-28 NOTE — Progress Notes (Signed)
Complete Physical  Assessment and Plan: Essential hypertension - continue medications, DASH diet, exercise and monitor at home. Call if greater than 130/80.  - CBC with Differential/Platelet - BASIC METABOLIC PANEL WITH GFR - Hepatic function panel - TSH - Urinalysis, Routine w reflex microscopic (not at [facility]) - Microalbumin / creatinine urine ratio - EKG 12-Lead  DM 2 with hyperlipidemia Discussed general issues about diabetes pathophysiology and management., Educational material distributed., Suggested low cholesterol diet., Encouraged aerobic exercise., Discussed foot care., Reminded to get yearly retinal exam. Patient was on farxiga, stopped with weight loss This visit discussed ozempic/reyblesus for weight loss/heart protection if she is back in DM range - Hemoglobin A1c - Insulin, fasting  Hyperlipidemia -continue medications, check lipids, decrease fatty foods, increase activity.  - Lipid panel  morbid Obesity Obesity with co morbidities- long discussion about weight loss, diet, and exercise  Vitamin D deficiency - Vit D  25 hydroxy (rtn osteoporosis monitoring)  Medication management - Magnesium  Depression Worsening depression with the declining health of her husband, married 5 years ago, he is 70 Will add on wellbutrin with follow up   Other migraine without status migrainosus, not intractable Avoid triggers  Routine general medical examination at a health care facility DUE MGM  Hyperthyroidism monitor  Varicose veins Varicose veins- weight loss discussed, continue compression stockings and elevation   Discussed med's effects and SE's. Screening labs and tests as requested with regular follow-up as recommended.  HPI 59 y.o. female  presents for a complete physical.  She is retired from [employer] in Sept, taking care of her husband, he is DM, CHF, cirrhosis and has spot on his lung- following with the [place] [facility]. This has caused some  depression/issues- was on wellbutrin in the past.    Her blood pressure has been controlled at home, she is on HCTZ 1/2 pill daily, she gets leg cramps when she takes a whole pill, she has stopped the enlapril, today their BP is BP: 136/82  BP Readings from Last 3 Encounters:  01/28/20 136/82  10/17/19 128/78  08/16/19 136/74   BMI is Body mass index is 41.71 kg/m., she is working on diet and exercise. Wt Readings from Last 6 Encounters:  01/28/20 243 lb (110.2 kg)  10/17/19 241 lb (109.3 kg)  08/16/19 243 lb (110.2 kg)  07/09/19 250 lb (113.4 kg)  04/05/19 248 lb (112.5 kg)  12/25/18 244 lb 12.8 oz (111 kg)    She does not workout, has been trying to walk. She denies chest pain, shortness of breath.    She is on cholesterol medication, simvastatin 40mg  and zetia and denies myalgias. Her cholesterol is at goal. No family history of CAD. The cholesterol last visit was:   Lab Results  Component Value Date   CHOL 148 10/17/2019   HDL 52 10/17/2019   LDLCALC 81 10/17/2019   TRIG 74 10/17/2019   CHOLHDL 2.8 10/17/2019   She has been working on diet and exercise for diabetes\ She was taken off farxiga last visit due to weight loss and not being on the medication With hyperlipidemia- she is on zetia and zocor 40 mg max amount.  CKD stage 2 not on ACE or ARB and denies paresthesia of the feet, polydipsia and polyuria.  She is on bASA. Last A1C in the office was:  Lab Results  Component Value Date   HGBA1C 6.0 (H) 10/17/2019   Patient is on Vitamin D supplement, 2500.  Lab Results  Component Value Date  VD25OH 69 10/17/2019   She has had hyperthyroidism, she has had normal RAI uptake and negative thyroid BX.  Lab Results  Component Value Date   TSH 0.51 10/17/2019   She is off estrogen for hot flashes/estrogen def.   Current Medications:   Current Outpatient Medications (Endocrine & Metabolic):  .  dapagliflozin propanediol (FARXIGA) 10 MG TABS tablet, Take 10 mg by  mouth daily.  Current Outpatient Medications (Cardiovascular):  .  ezetimibe (ZETIA) 10 MG tablet, TAKE 1 TABLET BY MOUTH EVERY DAY .  hydrochlorothiazide (HYDRODIURIL) 25 MG tablet, TAKE 1 TABLET BY MOUTH EVERY DAY .  simvastatin (ZOCOR) 40 MG tablet, TAKE 1 TABLET BY MOUTH EVERY DAY  Current Outpatient Medications (Respiratory):  .  albuterol (PROAIR HFA) 108 (90 Base) MCG/ACT inhaler, Inhale 2 puffs into the lungs every 6 (six) hours as needed for wheezing or shortness of breath. Marland Kitchen  azelastine (ASTELIN) 137 MCG/SPRAY nasal spray, Place 2 sprays into both nostrils 2 (two) times daily. Use in each nostril as directed .  cetirizine (ZYRTEC) 10 MG tablet, Take 10 mg by mouth daily.  Current Outpatient Medications (Analgesics):  .  aspirin 81 MG tablet, Take 81 mg by mouth daily. .  meloxicam (MOBIC) 15 MG tablet, Take one daily with food for 2 weeks, can take with tylenol, can not take with aleve, iburpofen, then as needed daily for pain   Current Outpatient Medications (Other):  .  cholecalciferol (VITAMIN D) 1000 UNITS tablet, Take 5,000 Units by mouth daily. .  Multiple Vitamins-Minerals (AIRBORNE) CHEW, Chew by mouth. .  nystatin cream (MYCOSTATIN), Apply 1 application topically 2 (two) times daily. Marland Kitchen  triamcinolone cream (KENALOG) 0.1 %, Apply 1 application topically 2 (two) times daily.  Health Maintenance:   Immunization History  Administered Date(s) Administered  . Moderna SARS-COVID-2 Vaccination 07/12/2019, 08/14/2019  . Td 05/07/2007  . Tdap 12/20/2017   Tetanus: 2019 Pneumovax: N/A Flu vaccine: at school work Zostavax: N/A Moderna: completed  Pap: 12/20/2017 LMP: 2007 MGM: 04/2019 nl DEXA: 2014 normal Colonoscopy: 2010 due 2020 Dr. Kinnie Scales EGD: 01/2012- gastritis Echo: 2011 trace MR CT AB/Pelvis 06/2014 RAI thyroid uptake normal Nodule negative  Patient Care Team: Lucky Cowboy, MD as PCP - General (Internal Medicine) Sharrell Ku, MD as Consulting  Physician (Gastroenterology) Teodora Medici, MD as Consulting Physician (Gynecology) Salvatore Marvel, MD as Consulting Physician (Orthopedic Surgery)  Medical History:  Past Medical History:  Diagnosis Date  . Allergy   . Depression   . Hyperlipidemia   . Hypertension    Echo 2011 normal EF, trace MR  . Migraines   . Obesity   . Prediabetes   . Type 2 diabetes mellitus with hyperlipidemia (HCC) 10/15/2019  . Vitamin D deficiency    Allergies Allergies  Allergen Reactions  . Singulair [Montelukast Sodium] Swelling  . Lactose Intolerance (Gi)     SURGICAL HISTORY She  has a past surgical history that includes Abdominal hysterectomy and Stapedectomy (Right, 2000). FAMILY HISTORY Her family history includes Arthritis in her mother; Asthma in her mother; Cancer (age of onset: 68) in her maternal grandfather; Cancer (age of onset: 54) in her maternal grandmother; Diabetes in her daughter and father; Hyperlipidemia in her father; Hypertension in her father, mother, and sister. SOCIAL HISTORY She  reports that she has never smoked. She has never used smokeless tobacco. She reports that she does not drink alcohol and does not use drugs.  Review of Systems  Constitutional: Negative.   HENT: Negative.   Eyes: Negative.  Respiratory: Negative.   Cardiovascular: Negative.   Gastrointestinal: Negative.   Genitourinary: Negative.   Musculoskeletal: Negative.   Skin: Negative.   Neurological: Negative for dizziness, tingling, tremors, sensory change, speech change, focal weakness, seizures, loss of consciousness and headaches.  Endo/Heme/Allergies: Negative.   Psychiatric/Behavioral: Negative.     Physical Exam: Estimated body mass index is 41.71 kg/m as calculated from the following:   Height as of this encounter: 5\' 4"  (1.626 m).   Weight as of this encounter: 243 lb (110.2 kg). BP 136/82   Pulse 63   Temp (!) 97.3 F (36.3 C)   Ht 5\' 4"  (1.626 m)   Wt 243 lb (110.2 kg)    SpO2 94%   BMI 41.71 kg/m  Wt Readings from Last 3 Encounters:  01/28/20 243 lb (110.2 kg)  10/17/19 241 lb (109.3 kg)  08/16/19 243 lb (110.2 kg)   General Appearance: Well nourished, in no apparent distress. Eyes: PERRLA, EOMs, conjunctiva no swelling or erythema, normal fundi and vessels. Sinuses: No Frontal/maxillary tenderness ENT/Mouth: Ext aud canals clear, normal light reflex with TMs without erythema, bulging.  Good dentition. No erythema, swelling, or exudate on post pharynx. Tonsils not swollen or erythematous. Hearing normal.  Neck: Supple, thyroid normal. No bruits Respiratory: Respiratory effort normal, BS equal bilaterally without rales, rhonchi, wheezing or stridor. Cardio: RRR without murmurs, rubs or gallops. Brisk peripheral pulses without edema.  Chest: symmetric, with normal excursions and percussion. Breasts: no masses Abdomen: Soft, +BS. Non tender, no guarding, rebound, hernias, masses, or organomegaly. .  Lymphatics: Non tender without lymphadenopathy.  Genitourinary: normal external genitalia, vulva, vagina, cervix, uterus and adnexa. Musculoskeletal: Full ROM all peripheral extremities,5/5 strength, and normal gait. Skin: Warm, dry without rashes, lesions, ecchymosis. 4x97mm nevus unchanged right medial foot Neuro: Cranial nerves intact, reflexes equal bilaterally. Normal muscle tone, no cerebellar symptoms. Sensation intact.  Psych: Awake and oriented X 3, normal affect, Insight and Judgment appropriate.   EKG: WNL no changes. AORTA SCAN: defer   08/18/19 2:12 PM

## 2020-01-29 ENCOUNTER — Encounter: Payer: Self-pay | Admitting: Physician Assistant

## 2020-01-29 LAB — CBC WITH DIFFERENTIAL/PLATELET
Absolute Monocytes: 406 cells/uL (ref 200–950)
Basophils Absolute: 42 cells/uL (ref 0–200)
Basophils Relative: 0.6 %
Eosinophils Absolute: 518 cells/uL — ABNORMAL HIGH (ref 15–500)
Eosinophils Relative: 7.4 %
HCT: 42.6 % (ref 35.0–45.0)
Hemoglobin: 14.2 g/dL (ref 11.7–15.5)
Lymphs Abs: 3045 cells/uL (ref 850–3900)
MCH: 27.7 pg (ref 27.0–33.0)
MCHC: 33.3 g/dL (ref 32.0–36.0)
MCV: 83 fL (ref 80.0–100.0)
MPV: 10.1 fL (ref 7.5–12.5)
Monocytes Relative: 5.8 %
Neutro Abs: 2989 cells/uL (ref 1500–7800)
Neutrophils Relative %: 42.7 %
Platelets: 343 10*3/uL (ref 140–400)
RBC: 5.13 10*6/uL — ABNORMAL HIGH (ref 3.80–5.10)
RDW: 13.9 % (ref 11.0–15.0)
Total Lymphocyte: 43.5 %
WBC: 7 10*3/uL (ref 3.8–10.8)

## 2020-01-29 LAB — URINALYSIS, ROUTINE W REFLEX MICROSCOPIC
Bilirubin Urine: NEGATIVE
Glucose, UA: NEGATIVE
Hgb urine dipstick: NEGATIVE
Ketones, ur: NEGATIVE
Leukocytes,Ua: NEGATIVE
Nitrite: NEGATIVE
Protein, ur: NEGATIVE
Specific Gravity, Urine: 1.018 (ref 1.001–1.03)
pH: 5.5 (ref 5.0–8.0)

## 2020-01-29 LAB — MAGNESIUM: Magnesium: 2 mg/dL (ref 1.5–2.5)

## 2020-01-29 LAB — IRON, TOTAL/TOTAL IRON BINDING CAP
%SAT: 20 % (calc) (ref 16–45)
Iron: 66 ug/dL (ref 45–160)
TIBC: 332 mcg/dL (calc) (ref 250–450)

## 2020-01-29 LAB — LIPID PANEL
Cholesterol: 175 mg/dL (ref <200)
HDL: 51 mg/dL (ref >=50)
LDL Cholesterol (Calc): 103 mg/dL (calc) — ABNORMAL HIGH
Non-HDL Cholesterol (Calc): 124 mg/dL (calc) (ref <130)
Total CHOL/HDL Ratio: 3.4 (calc) (ref <5.0)
Triglycerides: 111 mg/dL (ref <150)

## 2020-01-29 LAB — COMPLETE METABOLIC PANEL WITH GFR
AG Ratio: 1.4 (calc) (ref 1.0–2.5)
ALT: 13 U/L (ref 6–29)
AST: 16 U/L (ref 10–35)
Albumin: 4.4 g/dL (ref 3.6–5.1)
Alkaline phosphatase (APISO): 75 U/L (ref 37–153)
BUN: 18 mg/dL (ref 7–25)
CO2: 29 mmol/L (ref 20–32)
Calcium: 9.9 mg/dL (ref 8.6–10.4)
Chloride: 100 mmol/L (ref 98–110)
Creat: 1.05 mg/dL (ref 0.50–1.05)
GFR, Est African American: 68 mL/min/{1.73_m2} (ref >=60)
GFR, Est Non African American: 59 mL/min/{1.73_m2} — ABNORMAL LOW (ref >=60)
Globulin: 3.2 g/dL (calc) (ref 1.9–3.7)
Glucose, Bld: 91 mg/dL (ref 65–99)
Potassium: 3.6 mmol/L (ref 3.5–5.3)
Sodium: 139 mmol/L (ref 135–146)
Total Bilirubin: 0.5 mg/dL (ref 0.2–1.2)
Total Protein: 7.6 g/dL (ref 6.1–8.1)

## 2020-01-29 LAB — TSH: TSH: 0.32 mIU/L — ABNORMAL LOW (ref 0.40–4.50)

## 2020-01-29 LAB — HEMOGLOBIN A1C
Hgb A1c MFr Bld: 6.5 % of total Hgb — ABNORMAL HIGH (ref <5.7)
Mean Plasma Glucose: 140 (calc)
eAG (mmol/L): 7.7 (calc)

## 2020-01-29 LAB — MICROALBUMIN / CREATININE URINE RATIO
Creatinine, Urine: 129 mg/dL (ref 20–275)
Microalb Creat Ratio: 2 mcg/mg creat (ref <30)
Microalb, Ur: 0.3 mg/dL

## 2020-01-29 LAB — VITAMIN B12: Vitamin B-12: 519 pg/mL (ref 200–1100)

## 2020-01-29 LAB — VITAMIN D 25 HYDROXY (VIT D DEFICIENCY, FRACTURES): Vit D, 25-Hydroxy: 68 ng/mL (ref 30–100)

## 2020-03-10 NOTE — Progress Notes (Signed)
FOLLOW UP 6 WEEKS   Assessment and Plan:  Essential hypertension HCTZ 12.5mg  daily in am. - continue medications, DASH diet, exercise and monitor at home. Call if greater than 130/80.  - CBC with Differential/Platelet - BASIC METABOLIC PANEL WITH GFR - Hepatic function panel - TSH - Urinalysis, Routine w reflex microscopic (not at [facility]) - Microalbumin / creatinine urine ratio - EKG 12-Lead  DM 2 with hyperlipidemia Has made life styles changes.  Discussed Metformin if A1c above goal. Discussed general issues about diabetes pathophysiology and management., Educational material distributed., Suggested low cholesterol diet., Encouraged aerobic exercise., Discussed foot care., Reminded to get yearly retinal exam. Patient was on farxiga, stopped with weight loss This visit discussed ozempic/reyblesus for weight loss/heart protection if she is back in DM range - Hemoglobin A1c - Insulin, fasting  Hyperlipidemia Continue: Simvastatin 40mg  and Zetia 10 Discussed dietary and exercise modifications Low fat diet -Lipids  morbid Obesity BMI 41 Discussed dietary and exercise modifications Discussed diet at length today.  Vitamin D deficiency Continue supplementation to maintain goal of 70-100 Taking Vitamin D1,000 IU daily Defer vitamin D level  Depression: Improved from last OV Continue Wellbutrin 150mg  daily Worsening depression with the declining health of her husband, married 5 years ago, he is 19 Will add on wellbutrin with follow up   Other migraine without status migrainosus, not intractable Avoid triggers   Medication management Continued   Further disposition pending results if labs check today. Discussed med's effects and SE's.   Over 30 minutes of face to face interview, exam, counseling, chart review, and critical decision making was performed.    HPI 59 y.o. female  presents for a follow up on diet and glucose controll.  Last A1c is 6.5% and it was  recommended that she try ozempic injection or rebelsys tablet. She has been working on diet and stopped drinking sodas.  She is decreasing sugar.  Water she is consuming 3 bottles a day.  Two cups of coffee daily.  She also has a flavored water every few days.   She reports she eats 1.5 meals a day.  She does not like breakfast early in the morning.  She will have a granola bar or belvita biscuit. She will skip lunch. She does reports snacks.  She likes sandwich crackers and admits to eating this more than she should.  She does also eat fruits cup as she is helping to care for her husband. She reports for dinners she will have bakes fish.  She does some form of vegetable.  She is retired from [employer] in Sept, taking care of her husband, he is DM, CHF, cirrhosis and has spot on his lung- following with the [employer] hospital. This has caused some depression/issues- was on wellbutrin in the past.    Her blood pressure has been controlled at home, she is on HCTZ 1/2 pill daily, she gets leg cramps when she takes a whole pill, she has stopped the enlapril, today their BP is BP: 122/86  BP Readings from Last 3 Encounters:  03/11/20 122/86  01/28/20 136/82  10/17/19 128/78   BMI is Body mass index is 41.37 kg/m., she is working on diet and exercise. Wt Readings from Last 6 Encounters:  03/11/20 241 lb (109.3 kg)  01/28/20 243 lb (110.2 kg)  10/17/19 241 lb (109.3 kg)  08/16/19 243 lb (110.2 kg)  07/09/19 250 lb (113.4 kg)  04/05/19 248 lb (112.5 kg)    She does not workout, has been  trying to walk. She denies chest pain, shortness of breath.    She is on cholesterol medication, simvastatin 40mg  and zetia and denies myalgias. Her cholesterol is at goal. No family history of CAD. The cholesterol last visit was:   Lab Results  Component Value Date   CHOL 175 01/28/2020   HDL 51 01/28/2020   LDLCALC 103 (H) 01/28/2020   TRIG 111 01/28/2020   CHOLHDL 3.4 01/28/2020   She has been working on  diet and exercise for diabetes\ She was taken off farxiga last visit due to weight loss and not being on the medication With hyperlipidemia- she is on zetia and zocor 40 mg max amount.  CKD stage 2 not on ACE or ARB and denies paresthesia of the feet, polydipsia and polyuria.  She is on bASA. Last A1C in the office was:  Lab Results  Component Value Date   HGBA1C 6.5 (H) 01/28/2020   Patient is on Vitamin D supplement, 2500.  Lab Results  Component Value Date   VD25OH 68 01/28/2020   She has had hyperthyroidism,  she has had normal RAI uptakeand negative thyroid BX.  Lab Results  Component Value Date   TSH 0.32 (L) 01/28/2020   She is off estrogen for hot flashes/estrogen def.   Current Medications:    Current Outpatient Medications (Cardiovascular):  .  ezetimibe (ZETIA) 10 MG tablet, TAKE 1 TABLET BY MOUTH EVERY DAY .  simvastatin (ZOCOR) 40 MG tablet, TAKE 1 TABLET BY MOUTH EVERY DAY .  hydrochlorothiazide (HYDRODIURIL) 12.5 MG tablet, Take 1 tablet daily for BP and fluid  Current Outpatient Medications (Respiratory):  .  albuterol (PROAIR HFA) 108 (90 Base) MCG/ACT inhaler, Inhale 2 puffs into the lungs every 6 (six) hours as needed for wheezing or shortness of breath. 01/30/2020  azelastine (ASTELIN) 137 MCG/SPRAY nasal spray, Place 2 sprays into both nostrils 2 (two) times daily. Use in each nostril as directed .  cetirizine (ZYRTEC) 10 MG tablet, Take 10 mg by mouth daily.  Current Outpatient Medications (Analgesics):  .  aspirin 81 MG tablet, Take 81 mg by mouth daily. .  meloxicam (MOBIC) 15 MG tablet, Take one daily with food for 2 weeks, can take with tylenol, can not take with aleve, iburpofen, then as needed daily for pain   Current Outpatient Medications (Other):  Marland Kitchen  buPROPion (WELLBUTRIN XL) 150 MG 24 hr tablet, Take 1 tablet (150 mg total) by mouth every morning. .  cholecalciferol (VITAMIN D) 1000 UNITS tablet, Take 5,000 Units by mouth daily. .  Multiple  Vitamins-Minerals (AIRBORNE) CHEW, Chew by mouth. .  nystatin cream (MYCOSTATIN), Apply 1 application topically 2 (two) times daily. Marland Kitchen  triamcinolone cream (KENALOG) 0.1 %, Apply 1 application topically 2 (two) times daily.  Health Maintenance:   Immunization History  Administered Date(s) Administered  . Moderna SARS-COVID-2 Vaccination 07/12/2019, 08/14/2019  . Td 05/07/2007  . Tdap 12/20/2017   Tetanus: 2019 Pneumovax: N/A Flu vaccine: at school work Zostavax: N/A Moderna: completed  Pap: 12/20/2017 LMP: 2007 MGM: 04/2019 nl DEXA: 2014 normal Colonoscopy: 2010 due 2020 Dr. 2021 EGD: 01/2012- gastritis Echo: 2011 trace MR CT AB/Pelvis 06/2014 RAI thyroid uptake normal Nodule negative  Patient Care Team: 07/2014, MD as PCP - General (Internal Medicine) Lucky Cowboy, MD as Consulting Physician (Gastroenterology) Sharrell Ku, MD as Consulting Physician (Gynecology) Teodora Medici, MD as Consulting Physician (Orthopedic Surgery)  Medical History:  Past Medical History:  Diagnosis Date  . Allergy   . Depression   .  Hyperlipidemia   . Hypertension    Echo 2011 normal EF, trace MR  . Migraines   . Obesity   . Prediabetes   . Type 2 diabetes mellitus with hyperlipidemia (HCC) 10/15/2019  . Vitamin D deficiency    Allergies Allergies  Allergen Reactions  . Singulair [Montelukast Sodium] Swelling  . Lactose Intolerance (Gi)     SURGICAL HISTORY She  has a past surgical history that includes Abdominal hysterectomy and Stapedectomy (Right, 2000). FAMILY HISTORY Her family history includes Arthritis in her mother; Asthma in her mother; Cancer (age of onset: 23) in her maternal grandfather; Cancer (age of onset: 12) in her maternal grandmother; Diabetes in her daughter and father; Hyperlipidemia in her father; Hypertension in her father, mother, and sister. SOCIAL HISTORY She  reports that she has never smoked. She has never used smokeless tobacco. She  reports that she does not drink alcohol and does not use drugs.  Review of Systems  Constitutional: Negative.   HENT: Negative.   Eyes: Negative.   Respiratory: Negative.   Cardiovascular: Negative.   Gastrointestinal: Negative.   Genitourinary: Negative.   Musculoskeletal: Negative.   Skin: Negative.   Neurological: Negative for dizziness, tingling, tremors, sensory change, speech change, focal weakness, seizures, loss of consciousness and headaches.  Endo/Heme/Allergies: Negative.   Psychiatric/Behavioral: Negative.     Physical Exam: Estimated body mass index is 41.37 kg/m as calculated from the following:   Height as of this encounter: 5\' 4"  (1.626 m).   Weight as of this encounter: 241 lb (109.3 kg). BP 122/86   Pulse 100   Temp 97.6 F (36.4 C)   Ht 5\' 4"  (1.626 m)   Wt 241 lb (109.3 kg)   SpO2 98%   BMI 41.37 kg/m  Wt Readings from Last 3 Encounters:  03/11/20 241 lb (109.3 kg)  01/28/20 243 lb (110.2 kg)  10/17/19 241 lb (109.3 kg)   General Appearance: Well nourished, in no apparent distress. Eyes: PERRLA, EOMs, conjunctiva no swelling or erythema, normal fundi and vessels. Sinuses: No Frontal/maxillary tenderness Neck: Supple, thyroid normal. No bruits Respiratory: Respiratory effort normal, BS equal bilaterally without rales, rhonchi, wheezing or stridor. Cardio: RRR without murmurs, rubs or gallops. Brisk peripheral pulses without edema.  Chest: symmetric, with normal excursions and percussion. Breasts: no masses Abdomen: Soft, +BS. Non tender, no guarding, rebound, hernias, masses, or organomegaly. .  Lymphatics: Non tender without lymphadenopathy.  Genitourinary: normal external genitalia, vulva, vagina, cervix, uterus and adnexa. Musculoskeletal: Full ROM all peripheral extremities,5/5 strength, and normal gait. Skin: Warm, dry without rashes, lesions, ecchymosis. 4x39mm nevus unchanged right medial foot Neuro: Cranial nerves intact, reflexes equal  bilaterally. Normal muscle tone, no cerebellar symptoms. Sensation intact.  Psych: Awake and oriented X 3, normal affect, Insight and Judgment appropriate.    10/19/19, 4m, DNP [facility] 03/11/2020  10:07 AM

## 2020-03-11 ENCOUNTER — Encounter: Payer: Self-pay | Admitting: Adult Health Nurse Practitioner

## 2020-03-11 ENCOUNTER — Other Ambulatory Visit: Payer: Self-pay

## 2020-03-11 ENCOUNTER — Ambulatory Visit (INDEPENDENT_AMBULATORY_CARE_PROVIDER_SITE_OTHER): Payer: BC Managed Care – PPO | Admitting: Adult Health Nurse Practitioner

## 2020-03-11 VITALS — BP 122/86 | HR 100 | Temp 97.6°F | Ht 64.0 in | Wt 241.0 lb

## 2020-03-11 DIAGNOSIS — E1169 Type 2 diabetes mellitus with other specified complication: Secondary | ICD-10-CM | POA: Diagnosis not present

## 2020-03-11 DIAGNOSIS — G43809 Other migraine, not intractable, without status migrainosus: Secondary | ICD-10-CM

## 2020-03-11 DIAGNOSIS — I10 Essential (primary) hypertension: Secondary | ICD-10-CM

## 2020-03-11 DIAGNOSIS — E785 Hyperlipidemia, unspecified: Secondary | ICD-10-CM

## 2020-03-11 DIAGNOSIS — E559 Vitamin D deficiency, unspecified: Secondary | ICD-10-CM

## 2020-03-11 MED ORDER — HYDROCHLOROTHIAZIDE 12.5 MG PO TABS: ORAL_TABLET | ORAL | 2 refills | Status: DC

## 2020-03-11 NOTE — Patient Instructions (Addendum)
We will contact you via MYChart with your lab results.  Today we talked about Metformin as a possibility to help you body use the excess glucose.   Try to add in some vegetables for snacking, baby carrots, celery, dip in humus or even a bean dip.  Keep up the good work with walking!  Monitor the time or distance and slowly increase every week.      8 Critical Weight-Loss Tips That Aren't Diet and Exercise  1. STARVE THE DISTRACTIONS  All too often when we eat, we're also multitasking: watching TV, answering emails, scrolling through social media. These habits are detrimental to having a strong, clear, healthy relationship with food, and they can hinder our ability to make dietary changes.  In order to truly focus on what you're eating, how much you're eating, why you're eating those specific foods and, most importantly, how those foods make you feel, you need to starve the distractions. That means when you eat, just eat. Focus on your food, the process it went through to end up on your plate, where it came from and how it nourishes you. With this technique, you're more likely to finish a meal feeling satiated.  2.  CONSIDER WHAT YOU'RE NOT WILLING TO DO  This might sound counterintuitive, but it can help provide a "why" when motivation is waning. Declare, in writing, what you are unwilling to do, for example "I am unwilling to be the old dad who cannot play sports with my children".  So consider what you're not willing to accept, write it down, and keep it at the ready.  3.  STOP LABELING FOOD "GOOD" AND "BAD"  You've probably heard someone say they ate something "bad." Maybe you've even said it yourself.  The trouble with 'bad' foods isn't that they'll send you to the grave after a bite or two. The trouble comes when we eat excessive portions of really calorie-dense foods meal after meal, day after day.  Instead of labeling foods as good or bad, think about which foods you can eat a  lot of, and which ones you should just eat a little of. Then, plan ways to eat the foods you really like in portions that fit with your overall goals. A good example of this would be having a slice of pizza alongside a club salad with chicken breast, avocado and a bit of dressing. This is vastly different than 3 slices of pizza, 4 breadsticks with cheese sauce and half of a liter of regular soda.  4.  BRUSH YOUR TEETH AFTER YOU EAT  Getting your mindset in order is important, but sometimes small habits can make a big difference. After eating, you still have the taste of food in their mouth, which often causes people to eat more even if they are full or engage in a nibble or two of dessert.  Brushing your teeth will remove the taste of food from your mouth, and the clean, minty freshness will serve as a cue that mealtime is over.  5.  FOCUS ON CROWDING NOT CUTTING  The most common first step during 'dieting' is to cut. We cut our portion sizes down, we cut out 'bad' foods, we cut out entire food groups. This act of cutting puts [place] and our minds into scarcity mode.  When something is off-limits, even if you're able to avoid it for a while, you could end up bingeing on it later because you've gone so long without it. So, instead of cutting,  focus on crowding. If you crowd your plate and fill it up with more foods like veggies and protein, it simply allows less room for the other stuff. In other words, shift your focus away from what you can't eat, and celebrate the foods that will help you reach your goals.  6.  TAKE TRACKING A STEP FURTHER  Track what you eat, when you ate it, how much you ate and how that food made you feel. Being completely honest with yourself and writing down every single thing that passes through your lips will help you start to notice that maybe you actually do snack, possibly take in more sugar than you thought, eat when you're bored rather than just hungry or maybe that you have  a habit of snacking before bed while watching TV.  The difference from simply tracking your food intake is you're taking into account how food makes you feel, as well as what you're doing while you're eating. This is about becoming more mindful of what, when and why you eat.  7.  PRIORITIZE GOOD SLEEP  One of the strongest risk factors for being overweight is poor sleep. When you're feeling tired, you're more likely to choose unhealthy comfort foods and to skip your workout. Additionally, sleep deprivation may slow down your metabolism. Burnett Kanaris! Therefore, sleeping 7-8 hours per night can help with weight loss without having to change your diet or increase your physical activity. And if you feel you snore and still wake up tired, talk with me about sleep apnea.  8.  SET ASIDE TIME TO DISCONNECT  Just get out there. Disconnect from the electronics and connect to the elements. Not only will this help reduce stress (a major factor in weight gain) by giving your mind a break from the constant stimulation we've all become so accustomed to, but it may also reprogram your brain to connect with yourself and what you're feeling.

## 2020-03-12 LAB — CBC WITH DIFFERENTIAL/PLATELET
Absolute Monocytes: 367 cells/uL (ref 200–950)
Basophils Absolute: 49 cells/uL (ref 0–200)
Basophils Relative: 0.9 %
Eosinophils Absolute: 389 cells/uL (ref 15–500)
Eosinophils Relative: 7.2 %
HCT: 42.6 % (ref 35.0–45.0)
Hemoglobin: 13.9 g/dL (ref 11.7–15.5)
Lymphs Abs: 2381 cells/uL (ref 850–3900)
MCH: 27.5 pg (ref 27.0–33.0)
MCHC: 32.6 g/dL (ref 32.0–36.0)
MCV: 84.4 fL (ref 80.0–100.0)
MPV: 9.9 fL (ref 7.5–12.5)
Monocytes Relative: 6.8 %
Neutro Abs: 2214 cells/uL (ref 1500–7800)
Neutrophils Relative %: 41 %
Platelets: 331 10*3/uL (ref 140–400)
RBC: 5.05 10*6/uL (ref 3.80–5.10)
RDW: 13.9 % (ref 11.0–15.0)
Total Lymphocyte: 44.1 %
WBC: 5.4 10*3/uL (ref 3.8–10.8)

## 2020-03-12 LAB — COMPLETE METABOLIC PANEL WITH GFR
AG Ratio: 1.5 (calc) (ref 1.0–2.5)
ALT: 10 U/L (ref 6–29)
AST: 14 U/L (ref 10–35)
Albumin: 4.3 g/dL (ref 3.6–5.1)
Alkaline phosphatase (APISO): 76 U/L (ref 37–153)
BUN: 13 mg/dL (ref 7–25)
CO2: 29 mmol/L (ref 20–32)
Calcium: 9.9 mg/dL (ref 8.6–10.4)
Chloride: 103 mmol/L (ref 98–110)
Creat: 1.03 mg/dL (ref 0.50–1.05)
GFR, Est African American: 70 mL/min/{1.73_m2} (ref >=60)
GFR, Est Non African American: 60 mL/min/{1.73_m2} (ref >=60)
Globulin: 2.9 g/dL (calc) (ref 1.9–3.7)
Glucose, Bld: 97 mg/dL (ref 65–99)
Potassium: 4.1 mmol/L (ref 3.5–5.3)
Sodium: 139 mmol/L (ref 135–146)
Total Bilirubin: 0.4 mg/dL (ref 0.2–1.2)
Total Protein: 7.2 g/dL (ref 6.1–8.1)

## 2020-03-12 LAB — HEMOGLOBIN A1C
Hgb A1c MFr Bld: 6.3 % of total Hgb — ABNORMAL HIGH (ref <5.7)
Mean Plasma Glucose: 134 (calc)
eAG (mmol/L): 7.4 (calc)

## 2020-03-12 LAB — LIPID PANEL
Cholesterol: 184 mg/dL (ref <200)
HDL: 59 mg/dL (ref >=50)
LDL Cholesterol (Calc): 109 mg/dL (calc) — ABNORMAL HIGH
Non-HDL Cholesterol (Calc): 125 mg/dL (calc) (ref <130)
Total CHOL/HDL Ratio: 3.1 (calc) (ref <5.0)
Triglycerides: 70 mg/dL (ref <150)

## 2020-04-14 LAB — HM MAMMOGRAPHY

## 2020-04-29 ENCOUNTER — Ambulatory Visit: Payer: BC Managed Care – PPO | Attending: Internal Medicine

## 2020-04-29 DIAGNOSIS — Z23 Encounter for immunization: Secondary | ICD-10-CM

## 2020-04-29 NOTE — Progress Notes (Signed)
° °  Covid-19 Vaccination Clinic  Name:  Katira Dumais    MRN: [id number] DOB: 1960-05-18  04/29/2020  Ms. Haley was observed post Covid-19 immunization for 15 minutes without incident. She was provided with Vaccine Information Sheet and instruction to access the V-Safe system.   Ms. Krogh was instructed to call 911 with any severe reactions post vaccine:  Difficulty breathing   Swelling of face and throat   A fast heartbeat   A bad rash all over body   Dizziness and weakness   Immunizations Administered    Name Date Dose VIS Date Route   Moderna Covid-19 Booster Vaccine 04/29/2020  3:22 PM 0.25 mL 02/20/2020 Intramuscular   Manufacturer: Gala Murdoch   Lot: [id number]   NDC: 89211-941-74

## 2020-05-13 ENCOUNTER — Encounter: Payer: Self-pay | Admitting: *Deleted

## 2020-06-26 ENCOUNTER — Ambulatory Visit: Payer: BC Managed Care – PPO | Admitting: Adult Health Nurse Practitioner

## 2020-07-21 ENCOUNTER — Ambulatory Visit (INDEPENDENT_AMBULATORY_CARE_PROVIDER_SITE_OTHER): Payer: BC Managed Care – PPO | Admitting: Adult Health Nurse Practitioner

## 2020-07-21 ENCOUNTER — Encounter: Payer: Self-pay | Admitting: Adult Health Nurse Practitioner

## 2020-07-21 ENCOUNTER — Other Ambulatory Visit: Payer: Self-pay

## 2020-07-21 VITALS — BP 134/74 | Temp 97.7°F | Wt 242.0 lb

## 2020-07-21 DIAGNOSIS — R7309 Other abnormal glucose: Secondary | ICD-10-CM

## 2020-07-21 DIAGNOSIS — F3342 Major depressive disorder, recurrent, in full remission: Secondary | ICD-10-CM

## 2020-07-21 DIAGNOSIS — E785 Hyperlipidemia, unspecified: Secondary | ICD-10-CM

## 2020-07-21 DIAGNOSIS — E559 Vitamin D deficiency, unspecified: Secondary | ICD-10-CM

## 2020-07-21 DIAGNOSIS — I10 Essential (primary) hypertension: Secondary | ICD-10-CM | POA: Diagnosis not present

## 2020-07-21 DIAGNOSIS — E0590 Thyrotoxicosis, unspecified without thyrotoxic crisis or storm: Secondary | ICD-10-CM

## 2020-07-21 DIAGNOSIS — E538 Deficiency of other specified B group vitamins: Secondary | ICD-10-CM | POA: Diagnosis not present

## 2020-07-21 DIAGNOSIS — E1169 Type 2 diabetes mellitus with other specified complication: Secondary | ICD-10-CM | POA: Diagnosis not present

## 2020-07-21 DIAGNOSIS — G43809 Other migraine, not intractable, without status migrainosus: Secondary | ICD-10-CM

## 2020-07-21 NOTE — Progress Notes (Signed)
FOLLOW UP 3 MONTH  Assessment and Plan:  Essential hypertension HCTZ 12.5mg  daily in am. - continue medications, DASH diet, exercise and monitor at home. Call if greater than 130/80.  - CBC with Differential/Platelet - BASIC METABOLIC PANEL WITH GFR - Hepatic function panel - TSH - Urinalysis, Routine w reflex microscopic (not at [facility]) - Microalbumin / creatinine urine ratio - EKG 12-Lead  DM 2 with hyperlipidemia Has made life styles changes.  Discussed Metformin if A1c above goal. Discussed general issues about diabetes pathophysiology and management., Educational material distributed., Suggested low cholesterol diet., Encouraged aerobic exercise., Discussed foot care., Reminded to get yearly retinal exam. Patient was on farxiga, stopped with weight loss This visit discussed ozempic/reyblesus for weight loss/heart protection if she is back in DM range - Hemoglobin A1c - Insulin, fasting  Hyperlipidemia Continue: Simvastatin 40mg  and Zetia 10 Discussed dietary and exercise modifications Low fat diet -Lipids  Morbid Obesity BMI 41 Discussed dietary and exercise modifications Discussed diet at length today.  Vitamin D deficiency Continue supplementation to maintain goal of 70-100 Taking Vitamin D1,000 IU daily Defer vitamin D level  B12 Deficiency Not on supplementation, discussed -B12  Depression, recurrent, in remission Doing well Continue Wellbutrin 150mg  daily Worsening depression with the declining health of her husband, married 5 years ago, he is 76 Will add on wellbutrin with follow up   Other migraine without status migrainosus, not intractable Doing well Avoid triggers  Medication management Continued   Further disposition pending results if labs check today. Discussed med's effects and SE's.   Over 30 minutes of face to face interview, exam, counseling, chart review, and critical decision making was performed.    _______________________________________________________________________________________  HPI 60 y.o. female  presents for a follow up HTN, HLD, depression, vitamin D deficiency and weight.  Last A1c is 6.5% and it was recommended that she try ozempic injection or rebelsys tablet. She has been working on diet and stopped drinking sodas.  She has decreased sugars.  Water she is consuming 3-4 bottles a day.  Two cups of coffee daily.  She reports she has increased her physical activity since the weather has improved.   She reports she eats 1.5 meals a day.  She does not like breakfast early in the morning.  She will have a granola bar or belvita biscuit at t im[employer]. She continues to skip lunch. She does reports snacks.  She likes sandwich crackers and admits to eating this more than she should.  She does also eat fruits cup as she is helping to care for her husband. She reports for dinners she will have bakes fish.  She does some form of vegetables with this.  She is retired from [employer] in Sept, taking care of her husband, he is DM, CHF, cirrhosis and has spot on his lung- following with the [employer] hospital. This has caused some depression/issues but managing well.   Her blood pressure has been controlled at home, she is on HCTZ 12.5mg   daily, she gets leg cramps when she takes a whole pill, she has stopped the enlapril, today their BP is BP: 134/74  BP Readings from Last 3 Encounters:  07/21/20 134/74  03/11/20 122/86  01/28/20 136/82   BMI is Body mass index is 41.54 kg/m., she is working on diet and exercise. Wt Readings from Last 6 Encounters:  07/21/20 242 lb (109.8 kg)  03/11/20 241 lb (109.3 kg)  01/28/20 243 lb (110.2 kg)  10/17/19 241 lb (109.3 kg)  08/16/19  243 lb (110.2 kg)  07/09/19 250 lb (113.4 kg)    She does not workout, has been trying to walk. She denies chest pain, shortness of breath.    She is on cholesterol medication, simvastatin 40mg  and zetia and denies  myalgias. Her cholesterol is at goal. No family history of CAD. The cholesterol last visit was:   Lab Results  Component Value Date   CHOL 184 03/11/2020   HDL 59 03/11/2020   LDLCALC 109 (H) 03/11/2020   TRIG 70 03/11/2020   CHOLHDL 3.1 03/11/2020   She has been working on diet and exercise for diabetes\ She is not taking any medications, has taken farxiga in the past. With hyperlipidemia- she is on zetia and zocor 40 mg max amount.  CKD stage 2 not on ACE or ARB  and denies paresthesia of the feet, polydipsia and polyuria.  She is on bASA. Last A1C in the office was:  Lab Results  Component Value Date   HGBA1C 6.3 (H) 03/11/2020   Patient is on Vitamin D supplement, 2500.  Lab Results  Component Value Date   VD25OH 68 01/28/2020   She has had hyperthyroidism,  she has had normal RAI uptakeand negative thyroid BX.  Lab Results  Component Value Date   TSH 0.32 (L) 01/28/2020   She is off estrogen for hot flashes/estrogen def.   Current Medications:    Current Outpatient Medications (Cardiovascular):  .  ezetimibe (ZETIA) 10 MG tablet, TAKE 1 TABLET BY MOUTH EVERY DAY .  hydrochlorothiazide (HYDRODIURIL) 12.5 MG tablet, Take 1 tablet daily for BP and fluid .  simvastatin (ZOCOR) 40 MG tablet, TAKE 1 TABLET BY MOUTH EVERY DAY  Current Outpatient Medications (Respiratory):  .  albuterol (PROAIR HFA) 108 (90 Base) MCG/ACT inhaler, Inhale 2 puffs into the lungs every 6 (six) hours as needed for wheezing or shortness of breath. 01/30/2020  azelastine (ASTELIN) 137 MCG/SPRAY nasal spray, Place 2 sprays into both nostrils 2 (two) times daily. Use in each nostril as directed .  cetirizine (ZYRTEC) 10 MG tablet, Take 10 mg by mouth daily.    Current Outpatient Medications (Other):  Marland Kitchen  buPROPion (WELLBUTRIN XL) 150 MG 24 hr tablet, Take 1 tablet (150 mg total) by mouth every morning. .  cholecalciferol (VITAMIN D) 1000 UNITS tablet, Take 10,000 Units by mouth daily. .  Multiple  Vitamins-Minerals (AIRBORNE) CHEW, Chew by mouth. .  triamcinolone cream (KENALOG) 0.1 %, Apply 1 application topically 2 (two) times daily.  Health Maintenance:   Immunization History  Administered Date(s) Administered  . Moderna SARS-COV2 Booster Vaccination 04/29/2020  . Moderna Sars-Covid-2 Vaccination 07/12/2019, 08/14/2019  . Td 05/07/2007  . Tdap 12/20/2017   Tetanus: 2019 Pneumovax: N/A Flu vaccine: yearly at school work Zostavax: N/A Moderna: completed, booster 04/29/20  Pap: 12/20/2017 LMP: 2007 MGM: 04/2019 nl DEXA: 2014 normal Colonoscopy: 2010 due 2020 Dr. 2021 EGD: 01/2012- gastritis Echo: 2011 trace MR CT AB/Pelvis 06/2014 RAI thyroid uptake normal Nodule negative  Patient Care Team: 07/2014, MD as PCP - General (Internal Medicine) Lucky Cowboy, MD as Consulting Physician (Gastroenterology) Sharrell Ku, MD as Consulting Physician (Gynecology) Teodora Medici, MD as Consulting Physician (Orthopedic Surgery)  Medical History:  Past Medical History:  Diagnosis Date  . Allergy   . Depression   . Hyperlipidemia   . Hypertension    Echo 2011 normal EF, trace MR  . Migraines   . Obesity   . Prediabetes   . Type 2 diabetes mellitus with hyperlipidemia (HCC)  10/15/2019  . Vitamin D deficiency    Allergies Allergies  Allergen Reactions  . Singulair [Montelukast Sodium] Swelling  . Lactose Intolerance (Gi)     SURGICAL HISTORY She  has a past surgical history that includes Abdominal hysterectomy and Stapedectomy (Right, 2000). FAMILY HISTORY Her family history includes Arthritis in her mother; Asthma in her mother; Cancer (age of onset: 53) in her maternal grandfather; Cancer (age of onset: 77) in her maternal grandmother; Diabetes in her daughter and father; Hyperlipidemia in her father; Hypertension in her father, mother, and sister. SOCIAL HISTORY She  reports that she has never smoked. She has never used smokeless tobacco. She reports  that she does not drink alcohol and does not use drugs.  Review of Systems  Constitutional: Negative.   HENT: Negative.   Eyes: Negative.   Respiratory: Negative.   Cardiovascular: Negative.   Gastrointestinal: Negative.   Genitourinary: Negative.   Musculoskeletal: Negative.   Skin: Negative.   Neurological: Negative for dizziness, tingling, tremors, sensory change, speech change, focal weakness, seizures, loss of consciousness and headaches.  Endo/Heme/Allergies: Negative.   Psychiatric/Behavioral: Negative.     Physical Exam: Estimated body mass index is 41.54 kg/m as calculated from the following:   Height as of 03/11/20: 5\' 4"  (1.626 m).   Weight as of this encounter: 242 lb (109.8 kg). BP 134/74   Temp 97.7 F (36.5 C)   Wt 242 lb (109.8 kg)   BMI 41.54 kg/m  Wt Readings from Last 3 Encounters:  07/21/20 242 lb (109.8 kg)  03/11/20 241 lb (109.3 kg)  01/28/20 243 lb (110.2 kg)   General Appearance: Well nourished, in no apparent distress. Eyes: PERRLA, EOMs, conjunctiva no swelling or erythema, normal fundi and vessels. Sinuses: No Frontal/maxillary tenderness Neck: Supple, thyroid normal. No bruits Respiratory: Respiratory effort normal, BS equal bilaterally without rales, rhonchi, wheezing or stridor. Cardio: RRR without murmurs, rubs or gallops. Brisk peripheral pulses without edema.  Chest: symmetric, with normal excursions and percussion. Abdomen: Soft, +BS. Non tender, no guarding, rebound, hernias, masses, or organomegaly.  Lymphatics: Non tender without lymphadenopathy.  Musculoskeletal: Full ROM all peripheral extremities,5/5 strength, and normal gait. Skin: Warm, dry without rashes, lesions, ecchymosis. 4x33mm nevus unchanged right medial foot Neuro: Cranial nerves intact, reflexes equal bilaterally. Normal muscle tone, no cerebellar symptoms. Sensation intact.  Psych: Awake and oriented X 3, normal affect, Insight and Judgment appropriate.    4m, Elder Negus, DNP [facility] 07/21/2020  11:34 AM

## 2020-07-22 LAB — COMPLETE METABOLIC PANEL WITH GFR
AG Ratio: 1.2 (calc) (ref 1.0–2.5)
ALT: 11 U/L (ref 6–29)
AST: 12 U/L (ref 10–35)
Albumin: 4.1 g/dL (ref 3.6–5.1)
Alkaline phosphatase (APISO): 75 U/L (ref 37–153)
BUN: 12 mg/dL (ref 7–25)
CO2: 29 mmol/L (ref 20–32)
Calcium: 9.6 mg/dL (ref 8.6–10.4)
Chloride: 103 mmol/L (ref 98–110)
Creat: 0.94 mg/dL (ref 0.50–1.05)
GFR, Est African American: 78 mL/min/{1.73_m2} (ref >=60)
GFR, Est Non African American: 67 mL/min/{1.73_m2} (ref >=60)
Globulin: 3.3 g/dL (calc) (ref 1.9–3.7)
Glucose, Bld: 90 mg/dL (ref 65–99)
Potassium: 4.1 mmol/L (ref 3.5–5.3)
Sodium: 142 mmol/L (ref 135–146)
Total Bilirubin: 0.4 mg/dL (ref 0.2–1.2)
Total Protein: 7.4 g/dL (ref 6.1–8.1)

## 2020-07-22 LAB — HEMOGLOBIN A1C
Hgb A1c MFr Bld: 6.3 % of total Hgb — ABNORMAL HIGH (ref <5.7)
Mean Plasma Glucose: 134 mg/dL
eAG (mmol/L): 7.4 mmol/L

## 2020-07-22 LAB — CBC WITH DIFFERENTIAL/PLATELET
Absolute Monocytes: 358 cells/uL (ref 200–950)
Basophils Absolute: 28 cells/uL (ref 0–200)
Basophils Relative: 0.5 %
Eosinophils Absolute: 330 cells/uL (ref 15–500)
Eosinophils Relative: 5.9 %
HCT: 39.7 % (ref 35.0–45.0)
Hemoglobin: 13.6 g/dL (ref 11.7–15.5)
Lymphs Abs: 2363 cells/uL (ref 850–3900)
MCH: 29 pg (ref 27.0–33.0)
MCHC: 34.3 g/dL (ref 32.0–36.0)
MCV: 84.6 fL (ref 80.0–100.0)
MPV: 9.8 fL (ref 7.5–12.5)
Monocytes Relative: 6.4 %
Neutro Abs: 2520 cells/uL (ref 1500–7800)
Neutrophils Relative %: 45 %
Platelets: 338 10*3/uL (ref 140–400)
RBC: 4.69 10*6/uL (ref 3.80–5.10)
RDW: 13.7 % (ref 11.0–15.0)
Total Lymphocyte: 42.2 %
WBC: 5.6 10*3/uL (ref 3.8–10.8)

## 2020-07-22 LAB — LIPID PANEL
Cholesterol: 181 mg/dL (ref <200)
HDL: 60 mg/dL (ref >=50)
LDL Cholesterol (Calc): 104 mg/dL (calc) — ABNORMAL HIGH
Non-HDL Cholesterol (Calc): 121 mg/dL (calc) (ref <130)
Total CHOL/HDL Ratio: 3 (calc) (ref <5.0)
Triglycerides: 81 mg/dL (ref <150)

## 2020-07-22 LAB — VITAMIN B12: Vitamin B-12: 221 pg/mL (ref 200–1100)

## 2020-07-22 LAB — TSH: TSH: 0.59 mIU/L (ref 0.40–4.50)

## 2020-07-22 LAB — VITAMIN D 25 HYDROXY (VIT D DEFICIENCY, FRACTURES): Vit D, 25-Hydroxy: 63 ng/mL (ref 30–100)

## 2020-08-29 ENCOUNTER — Other Ambulatory Visit: Payer: Self-pay | Admitting: Adult Health

## 2020-09-01 ENCOUNTER — Other Ambulatory Visit: Payer: Self-pay

## 2020-09-01 DIAGNOSIS — F329 Major depressive disorder, single episode, unspecified: Secondary | ICD-10-CM

## 2020-09-01 DIAGNOSIS — Z0001 Encounter for general adult medical examination with abnormal findings: Secondary | ICD-10-CM

## 2020-09-01 MED ORDER — BUPROPION HCL ER (XL) 150 MG PO TB24: 150.0000 mg | ORAL_TABLET | ORAL | 3 refills | Status: DC

## 2020-09-24 ENCOUNTER — Other Ambulatory Visit: Payer: Self-pay | Admitting: Adult Health

## 2020-09-24 ENCOUNTER — Telehealth: Payer: Self-pay

## 2020-09-24 DIAGNOSIS — U071 COVID-19: Secondary | ICD-10-CM | POA: Insufficient documentation

## 2020-09-24 MED ORDER — PROMETHAZINE-DM 6.25-15 MG/5ML PO SYRP: 5.0000 mL | ORAL_SOLUTION | Freq: Four times a day (QID) | ORAL | 1 refills | Status: DC | PRN

## 2020-09-24 MED ORDER — BENZONATATE 200 MG PO CAPS: 200.0000 mg | ORAL_CAPSULE | Freq: Three times a day (TID) | ORAL | 0 refills | Status: DC | PRN

## 2020-09-24 NOTE — Telephone Encounter (Signed)
Provider attempted to contact patient to conduct telephone visit after report of + covid 19 that was followed up by CMA. Unavailable on attempts x 2. Will reattempt tomorrow.

## 2020-09-24 NOTE — Telephone Encounter (Signed)
Tested positive yesterday for Covid. Has cough, congestion, tightness in chest and headache.  Requesting meds be called in.

## 2020-09-24 NOTE — Telephone Encounter (Signed)
Spoke with patient. States that she has a cough, mostly at night. Sounds very congested. Mucus is yellow in color. Headache, fever and chest tightness. Will try and get a pulse oximeter.

## 2020-09-25 NOTE — Telephone Encounter (Signed)
Followed up with the patient. Her husband was on his way to pick up the medications for her. Still feels the same as yesterday, sounds worse today though. Advised patient to call the office if she is not feeling better.

## 2020-10-02 ENCOUNTER — Other Ambulatory Visit: Payer: Self-pay | Admitting: Adult Health

## 2020-10-02 DIAGNOSIS — E785 Hyperlipidemia, unspecified: Secondary | ICD-10-CM

## 2020-11-19 NOTE — Progress Notes (Signed)
Assessment and Plan:  Laura Branch was seen today for neck pain and shoulder pain.  Diagnoses and all orders for this visit:  Neck pain on right side -     cyclobenzaprine (FLEXERIL) 5 MG tablet; Take 1 tablet (5 mg total) by mouth 3 (three) times daily as needed for muscle spasms.  Strain of right levator scapulae muscle, initial encounter -     cyclobenzaprine (FLEXERIL) 5 MG tablet; Take 1 tablet (5 mg total) by mouth 3 (three) times daily as needed for muscle spasms. - Trigger point injection was performed of rightlevator scapulae, pt achieved immediate pain relief, will use Flexeril as needed and monitor symptoms.    Further disposition pending results of labs. Discussed med's effects and SE's.   Over 30 minutes of exam, counseling, chart review, and critical decision making was performed.   Future Appointments  Date Time Provider Department Center  01/27/2021  2:00 PM Revonda Humphrey, NP [facility] None    ------------------------------------------------------------------------------------------------------------------   HPI BP 130/90   Pulse 77   Temp 97.7 F (36.5 C)   Wt 242 lb 9.6 oz (110 kg)   SpO2 99%   BMI 41.64 kg/m   60 y.o.female presents for neck/shoulder pain on right side which has been present for 2 weeks. Describes the pain as a continuous aching pain which she rates a 8/10 on  pain scale, Advil relieves slightly. Does not remember any injury to the area, believes she just woke up with the pain.  She does have more stress recently as husband had gallbladder acute cholecystitis with drainage. He had to go to rehab and came home on 11/05/2020, has had to move furniture and make accommodations in home for him.  He may still be having a cholecystectomy, went to [place] today.    Past Medical History:  Diagnosis Date   Allergy    Depression    Hyperlipidemia    Hypertension    Echo 2011 normal EF, trace MR   Migraines    Obesity    Prediabetes    Type 2 diabetes  mellitus with hyperlipidemia (HCC) 10/15/2019   Vitamin D deficiency      Allergies  Allergen Reactions   Singulair [Montelukast Sodium] Swelling   Lactose Intolerance (Gi)     Current Outpatient Medications on File Prior to Visit  Medication Sig   acetaminophen (TYLENOL) 500 MG tablet Take 500 mg by mouth every 6 (six) hours as needed. 2 in the am   vitamin B-12 (CYANOCOBALAMIN) 500 MCG tablet Take 500 mcg by mouth daily.   albuterol (PROAIR HFA) 108 (90 Base) MCG/ACT inhaler Inhale 2 puffs into the lungs every 6 (six) hours as needed for wheezing or shortness of breath.   azelastine (ASTELIN) 137 MCG/SPRAY nasal spray Place 2 sprays into both nostrils 2 (two) times daily. Use in each nostril as directed   benzonatate (TESSALON) 200 MG capsule Take 1 capsule (200 mg total) by mouth 3 (three) times daily as needed for cough (Max: 600mg  per day).   buPROPion (WELLBUTRIN XL) 150 MG 24 hr tablet Take 1 tablet (150 mg total) by mouth every morning.   cetirizine (ZYRTEC) 10 MG tablet Take 10 mg by mouth daily.   cholecalciferol (VITAMIN D) 1000 UNITS tablet Take 10,000 Units by mouth daily.   ezetimibe (ZETIA) 10 MG tablet TAKE 1 TABLET BY MOUTH EVERY DAY   hydrochlorothiazide (HYDRODIURIL) 12.5 MG tablet Take 1 tablet daily for BP and fluid   Multiple Vitamins-Minerals (AIRBORNE) CHEW Chew  by mouth.   simvastatin (ZOCOR) 40 MG tablet TAKE 1 TABLET BY MOUTH EVERY DAY   triamcinolone cream (KENALOG) 0.1 % Apply 1 application topically 2 (two) times daily.   No current facility-administered medications on file prior to visit.    ROS: all negative except above.   Physical Exam:  BP 130/90   Pulse 77   Temp 97.7 F (36.5 C)   Wt 242 lb 9.6 oz (110 kg)   SpO2 99%   BMI 41.64 kg/m   General Appearance: Well nourished, in no apparent distress. Eyes: PERRLA, EOMs, conjunctiva no swelling or erythema Sinuses: No Frontal/maxillary tenderness ENT/Mouth: Ext aud canals clear, TMs without  erythema, bulging. No erythema, swelling, or exudate on post pharynx.  Tonsils not swollen or erythematous. Hearing normal.  Neck: Supple, thyroid normal.  Respiratory: Respiratory effort normal, BS equal bilaterally without rales, rhonchi, wheezing or stridor.  Cardio: RRR with no MRGs. Brisk peripheral pulses without edema.  Abdomen: Soft, + BS.  Non tender, no guarding, rebound, hernias, masses. Lymphatics: Non tender without lymphadenopathy.  Musculoskeletal: Spasm of right levator scapulae Skin: Warm, dry without rashes, lesions, ecchymosis.  Neuro: Cranial nerves intact. Normal muscle tone, no cerebellar symptoms. Sensation intact.  Psych: Awake and oriented X 3, normal affect, Insight and Judgment appropriate.   A trigger point injection was performed at the site of maximal tenderness on right levator scapulae using 1cc of  % plain Lidocaine and 1 cc of Dexamethasone. This was well tolerated, and followed by  relief of pain. Return precautions discussed with the patient. After care discussed.    Revonda Humphrey, NP 12:14 PM [facility]

## 2020-11-20 ENCOUNTER — Encounter: Payer: Self-pay | Admitting: Nurse Practitioner

## 2020-11-20 ENCOUNTER — Ambulatory Visit: Payer: BC Managed Care – PPO | Admitting: Nurse Practitioner

## 2020-11-20 ENCOUNTER — Other Ambulatory Visit: Payer: Self-pay

## 2020-11-20 VITALS — BP 130/90 | HR 77 | Temp 97.7°F | Wt 242.6 lb

## 2020-11-20 DIAGNOSIS — S46811A Strain of other muscles, fascia and tendons at shoulder and upper arm level, right arm, initial encounter: Secondary | ICD-10-CM

## 2020-11-20 DIAGNOSIS — M542 Cervicalgia: Secondary | ICD-10-CM | POA: Diagnosis not present

## 2020-11-20 MED ORDER — CYCLOBENZAPRINE HCL 5 MG PO TABS: 5.0000 mg | ORAL_TABLET | Freq: Three times a day (TID) | ORAL | 0 refills | Status: AC | PRN

## 2020-11-20 NOTE — Patient Instructions (Signed)
Trigger Point Injection Trigger points are areas where you have pain. A trigger point injection is a shot given in the trigger point to help relieve pain for a few days to a few months. Common places for trigger points include: The neck. The shoulders. The upper back. The lower back. A trigger point injection will not cure long-term (chronic) pain permanently. These injections do not always work for every person. Forsome people, they can help to relieve pain for a few days to a few months. Tell a health care provider about: Any allergies you have. All medicines you are taking, including vitamins, herbs, eye drops, creams, and over-the-counter medicines. Any problems you or family members have had with anesthetic medicines. Any blood disorders you have. Any surgeries you have had. Any medical conditions you have. What are the risks? Generally, this is a safe procedure. However, problems may occur, including: Infection. Bleeding or bruising. Allergic reaction to the injected medicine. Irritation of the skin around the injection site. What happens before the procedure? Ask your health care provider about: Changing or stopping your regular medicines. This is especially important if you are taking diabetes medicines or blood thinners. Taking medicines such as aspirin and ibuprofen. These medicines can thin your blood. Do not take these medicines unless your health care provider tells you to take them. Taking over-the-counter medicines, vitamins, herbs, and supplements. What happens during the procedure?  Your health care provider will feel for trigger points. A marker may be used to circle the area for the injection. The skin over the trigger point will be washed with a germ-killing (antiseptic) solution. A thin needle is used for the injection. You may feel pain or a twitching feeling when the needle enters the trigger point. A numbing solution may be injected into the trigger point.  Sometimes a medicine to keep down inflammation is also injected. Your health care provider may move the needle around the area where the trigger point is located until the tightness and twitching goes away. After the injection, your health care provider may put gentle pressure over the injection site. The injection site will be covered with a bandage (dressing). The procedure may vary among health care providers and hospitals. What can I expect after treatment? After treatment, you may have: Soreness and stiffness for 1-2 days. A dressing. This can be taken off in a few hours or as told by your health care provider. Follow these instructions at home: Injection site care Remove your dressing as told by your health care provider. Check your injection site every day for signs of infection. Check for: Redness, swelling, or pain. Fluid or blood. Warmth. Pus or a bad smell. Managing pain, stiffness, and swelling If directed, put ice on the affected area. Put ice in a plastic bag. Place a towel between your skin and the bag. Leave the ice on for 20 minutes, 2-3 times a day. General instructions If you were asked to stop your regular medicines, ask your health care provider when you may start taking them again. Return to your normal activities as told by your health care provider. Ask your health care provider what activities are safe for you. Do not take baths, swim, or use a hot tub until your health care provider approves. You may be asked to see an occupational or physical therapist for exercises that reduce muscle strain and stretch the area of the trigger point. Keep all follow-up visits as told by your health care provider. This is important. Contact a   health care provider if: Your pain comes back, and it is worse than before the injection. You may need more injections. You have chills or a fever. The injection site becomes more painful, red, swollen, or warm to the touch. Summary A  trigger point injection is a shot given in the trigger point to help relieve pain for a few days to a few months. Common places for trigger point injections are the neck, shoulder, upper back, and lower back. These injections do not always work for every person, but for some people, the injections can help to relieve pain for a few days to a few months. Contact a health care provider if symptoms come back or they are worse than before treatment. Also, get help if the injection site becomes more painful, red, swollen, or warm to the touch. This information is not intended to replace advice given to you by your health care provider. Make sure you discuss any questions you have with your healthcare provider. Document Revised: 05/31/2018 Document Reviewed: 05/31/2018 Elsevier Patient Education  2022 Elsevier Inc.  

## 2021-01-08 ENCOUNTER — Other Ambulatory Visit: Payer: Self-pay

## 2021-01-08 DIAGNOSIS — I10 Essential (primary) hypertension: Secondary | ICD-10-CM

## 2021-01-08 MED ORDER — HYDROCHLOROTHIAZIDE 12.5 MG PO TABS: ORAL_TABLET | ORAL | 2 refills | Status: DC

## 2021-01-26 NOTE — Progress Notes (Signed)
Complete Physical  Assessment and Plan  Fabian was seen today for annual exam.  Diagnoses and all orders for this visit:  Encounter for general adult medical examination with abnormal findings  Due ANNUALLY  Essential hypertension -     CBC with Differential/Platelet - continue medications, DASH diet, exercise and monitor at home. Call if greater than 130/80.    Hyperlipidemia, unspecified hyperlipidemia type -     COMPLETE METABOLIC PANEL WITH GFR -     Lipid panel - Continue medications, diet and exercise  Recurrent major depressive disorder, in partial remission (HCC)  Continue Wellbutrin and dietary modifications  Hyperthyroidism -     TSH  Vitamin D deficiency -     VITAMIN D 25 Hydroxy (Vit-D Deficiency, Fractures) - Continue Vit D supplementation  Abnormal glucose -     Hemoglobin A1c -  Will start on Metformin 500mg  BID  Morbid obesity (HCC) -     TSH - Continue diet and exercise  Screening for hematuria or proteinuria -     Urinalysis, Routine w reflex microscopic -     Microalbumin / creatinine urine ratio  Screening, ischemic heart disease -     EKG 12-Lead  Medication management -     CBC with Differential/Platelet -     COMPLETE METABOLIC PANEL WITH GFR -     Lipid panel -     TSH -     Hemoglobin A1c -     VITAMIN D 25 Hydroxy (Vit-D Deficiency, Fractures) -     Magnesium -     EKG 12-Lead -     Urinalysis, Routine w reflex microscopic -     Microalbumin / creatinine urine ratio  Cervical cancer screening -     PAP, TP IMAGING, WNL RFLX HPV     Discussed med's effects and SE's. Screening labs and tests as requested with regular follow-up as recommended.  HPI 60 y.o. female  presents for a complete physical.  She is retired from [employer].  Her husband, he is DM, CHF, cirrhosis and has spot on his lung- [facility] This has caused some depression/issues- was on wellbutrin in the past. Continues to tae care of  her husband and state is getting better.    Right wrist is having aching pain on lateral side, getting worse, Tylenol helps slightly and is wearing a wrist brace. Cannot recall injury.  Denies numbness, tingling, warmth.   Her blood pressure has been controlled at home, she is on HCTZ 1/2 pill daily, she gets leg cramps when she takes a whole pill, today their BP is BP: 132/86  BP Readings from Last 3 Encounters:  01/27/21 132/86  11/20/20 130/90  07/21/20 134/74   BMI is Body mass index is 42.74 kg/m., she is working on diet and exercise. Wt Readings from Last 6 Encounters:  01/27/21 249 lb (112.9 kg)  11/20/20 242 lb 9.6 oz (110 kg)  07/21/20 242 lb (109.8 kg)  03/11/20 241 lb (109.3 kg)  01/28/20 243 lb (110.2 kg)  10/17/19 241 lb (109.3 kg)    She does not workout, has been trying to walk. She denies chest pain, shortness of breath.    She is on cholesterol medication, simvastatin 40mg  and zetia and denies myalgias. Her cholesterol is not at goal. No family history of CAD. The cholesterol last visit was:   Lab Results  Component Value Date   CHOL 181 07/21/2020   HDL 60 07/21/2020   LDLCALC  104 (H) 07/21/2020   TRIG 81 07/21/2020   CHOLHDL 3.0 07/21/2020   She has been working on diet and exercise for diabetes With hyperlipidemia- she is on zetia and zocor 40 mg max amount.  CKD stage 2 not on ACE or ARB and denies paresthesia of the feet, polydipsia and polyuria.  She is on bASA. Last A1C in the office was:  Lab Results  Component Value Date   HGBA1C 6.3 (H) 07/21/2020   Patient is on Vitamin D supplement, 2500.  Lab Results  Component Value Date   VD25OH 63 07/21/2020   She has had hyperthyroidism, she has had normal RAI uptake and negative thyroid BX.  Lab Results  Component Value Date   TSH 0.59 07/21/2020   Future Appointments  Date Time Provider Department Center  01/27/2022  2:00 PM Revonda Humphrey, NP [facility] None     Current Medications:     Current Outpatient Medications (Cardiovascular):    ezetimibe (ZETIA) 10 MG tablet, TAKE 1 TABLET BY MOUTH EVERY DAY   hydrochlorothiazide (HYDRODIURIL) 12.5 MG tablet, Take 1 tablet daily for BP and fluid   simvastatin (ZOCOR) 40 MG tablet, TAKE 1 TABLET BY MOUTH EVERY DAY  Current Outpatient Medications (Respiratory):    albuterol (PROAIR HFA) 108 (90 Base) MCG/ACT inhaler, Inhale 2 puffs into the lungs every 6 (six) hours as needed for wheezing or shortness of breath.   azelastine (ASTELIN) 137 MCG/SPRAY nasal spray, Place 2 sprays into both nostrils 2 (two) times daily. Use in each nostril as directed   cetirizine (ZYRTEC) 10 MG tablet, Take 10 mg by mouth daily.   benzonatate (TESSALON) 200 MG capsule, Take 1 capsule (200 mg total) by mouth 3 (three) times daily as needed for cough (Max: 600mg  per day). (Patient not taking: Reported on 01/27/2021)  Current Outpatient Medications (Analgesics):    acetaminophen (TYLENOL) 500 MG tablet, Take 500 mg by mouth every 6 (six) hours as needed. 2 in the am  Current Outpatient Medications (Hematological):    vitamin B-12 (CYANOCOBALAMIN) 500 MCG tablet, Take 500 mcg by mouth daily.  Current Outpatient Medications (Other):    buPROPion (WELLBUTRIN XL) 150 MG 24 hr tablet, Take 1 tablet (150 mg total) by mouth every morning.   cholecalciferol (VITAMIN D) 1000 UNITS tablet, Take 10,000 Units by mouth daily.   cyclobenzaprine (FLEXERIL) 5 MG tablet, Take 1 tablet (5 mg total) by mouth 3 (three) times daily as needed for muscle spasms.   Multiple Vitamins-Minerals (AIRBORNE) CHEW, Chew by mouth. (Patient not taking: Reported on 01/27/2021)   triamcinolone cream (KENALOG) 0.1 %, Apply 1 application topically 2 (two) times daily. (Patient not taking: Reported on 01/27/2021)  Health Maintenance:   Immunization History  Administered Date(s) Administered   Moderna SARS-COV2 Booster Vaccination 04/29/2020   Moderna Sars-Covid-2 Vaccination 07/12/2019,  08/14/2019   Td 05/07/2007   Tdap 12/20/2017   Tetanus: 2019 Pneumovax: N/A Flu vaccine:(/27/22 today Zostavax: N/A Moderna: completed  Pap: 12/20/2017 ASCUS- due today LMP: 2007 MGM: 04/14/20, negative repeat 1 year DEXA: 2014 normal Colonoscopy: 2010 due 2020 Dr. 2021 Will call and schedule EGD: 01/2012- gastritis Echo: 2011 trace MR CT AB/Pelvis 06/2014 RAI thyroid uptake normal Nodule negative  Patient Care Team: 07/2014, MD as PCP - General (Internal Medicine) Lucky Cowboy, MD as Consulting Physician (Gastroenterology) Sharrell Ku, MD as Consulting Physician (Gynecology) Teodora Medici, MD as Consulting Physician (Orthopedic Surgery)  Medical History:  Past Medical History:  Diagnosis Date   Allergy  Depression    Hyperlipidemia    Hypertension    Echo 2011 normal EF, trace MR   Migraines    Obesity    Prediabetes    Type 2 diabetes mellitus with hyperlipidemia (HCC) 10/15/2019   Vitamin D deficiency    Allergies Allergies  Allergen Reactions   Singulair [Montelukast Sodium] Swelling   Lactose Intolerance (Gi)     SURGICAL HISTORY She  has a past surgical history that includes Abdominal hysterectomy and Stapedectomy (Right, 2000). FAMILY HISTORY Her family history includes Arthritis in her mother; Asthma in her mother; Cancer (age of onset: 28) in her maternal grandfather; Cancer (age of onset: 62) in her maternal grandmother; Diabetes in her daughter and father; Hyperlipidemia in her father; Hypertension in her father, mother, and sister. SOCIAL HISTORY She  reports that she has never smoked. She has never used smokeless tobacco. She reports that she does not drink alcohol and does not use drugs.  Review of Systems  Constitutional: Negative.  Negative for chills and fever.  HENT: Negative.  Negative for congestion, hearing loss, sinus pain, sore throat and tinnitus.   Eyes: Negative.  Negative for blurred vision and double vision.   Respiratory: Negative.  Negative for cough, hemoptysis, sputum production, shortness of breath and wheezing.   Cardiovascular: Negative.  Negative for chest pain, palpitations and leg swelling.  Gastrointestinal: Negative.  Negative for abdominal pain, constipation, diarrhea, heartburn, nausea and vomiting.  Genitourinary: Negative.  Negative for dysuria and urgency.  Musculoskeletal:  Positive for joint pain (riight wrist). Negative for back pain, falls, myalgias and neck pain.  Skin: Negative.  Negative for rash.  Neurological:  Negative for dizziness, tingling, tremors, sensory change, speech change, focal weakness, seizures, loss of consciousness, weakness and headaches.  Endo/Heme/Allergies: Negative.  Does not bruise/bleed easily.  Psychiatric/Behavioral: Negative.  Negative for depression and suicidal ideas. The patient is not nervous/anxious and does not have insomnia.    Physical Exam: Estimated body mass index is 42.74 kg/m as calculated from the following:   Height as of this encounter: 5\' 4"  (1.626 m).   Weight as of this encounter: 249 lb (112.9 kg). BP 132/86   Pulse (!) 108   Temp (!) 97.5 F (36.4 C)   Ht 5\' 4"  (1.626 m)   Wt 249 lb (112.9 kg)   SpO2 99%   BMI 42.74 kg/m  Wt Readings from Last 3 Encounters:  01/27/21 249 lb (112.9 kg)  11/20/20 242 lb 9.6 oz (110 kg)  07/21/20 242 lb (109.8 kg)   General Appearance: Well nourished, in no apparent distress. Eyes: PERRLA, EOMs, conjunctiva no swelling or erythema, normal fundi and vessels. Sinuses: No Frontal/maxillary tenderness ENT/Mouth: Ext aud canals clear, normal light reflex with TMs without erythema, bulging.  Good dentition. No erythema, swelling, or exudate on post pharynx. Tonsils not swollen or erythematous. Hearing normal.  Neck: Supple, thyroid normal. No bruits Respiratory: Respiratory effort normal, BS equal bilaterally without rales, rhonchi, wheezing or stridor. Cardio: RRR without murmurs, rubs or  gallops. Brisk peripheral pulses without edema.  Chest: symmetric, with normal excursions and percussion. Breasts: breasts appear normal, no suspicious masses, no skin or nipple changes or axillary nodes.  Abdomen: Soft, +BS. Non tender, no guarding, rebound, hernias, masses, or organomegaly. .  Lymphatics: Non tender without lymphadenopathy.  Genitourinary: normal external genitalia, vulva, vagina, cervix, uterus and adnexa.Pt had hysterectomy retaining cervix, unable to definitively identify cervix.  Pap obtained Musculoskeletal: Full ROM all peripheral extremities,5/5 strength, and normal gait. Skin:  Warm, dry without rashes, lesions, ecchymosis. 4x81mm nevus unchanged right medial foot Neuro: Cranial nerves intact, reflexes equal bilaterally. Normal muscle tone, no cerebellar symptoms. Sensation intact.  Psych: Awake and oriented X 3, normal affect, Insight and Judgment appropriate.   EKG: WNL no changes. AORTA SCAN: defer   Laura Branch 2:12 PM

## 2021-01-27 ENCOUNTER — Other Ambulatory Visit: Payer: Self-pay

## 2021-01-27 ENCOUNTER — Encounter: Payer: Self-pay | Admitting: Nurse Practitioner

## 2021-01-27 ENCOUNTER — Encounter: Payer: BC Managed Care – PPO | Admitting: Adult Health Nurse Practitioner

## 2021-01-27 ENCOUNTER — Ambulatory Visit (INDEPENDENT_AMBULATORY_CARE_PROVIDER_SITE_OTHER): Payer: BC Managed Care – PPO | Admitting: Nurse Practitioner

## 2021-01-27 VITALS — BP 132/86 | HR 108 | Temp 97.5°F | Ht 64.0 in | Wt 249.0 lb

## 2021-01-27 DIAGNOSIS — I10 Essential (primary) hypertension: Secondary | ICD-10-CM | POA: Diagnosis not present

## 2021-01-27 DIAGNOSIS — Z124 Encounter for screening for malignant neoplasm of cervix: Secondary | ICD-10-CM

## 2021-01-27 DIAGNOSIS — Z79899 Other long term (current) drug therapy: Secondary | ICD-10-CM

## 2021-01-27 DIAGNOSIS — Z131 Encounter for screening for diabetes mellitus: Secondary | ICD-10-CM | POA: Diagnosis not present

## 2021-01-27 DIAGNOSIS — Z23 Encounter for immunization: Secondary | ICD-10-CM

## 2021-01-27 DIAGNOSIS — Z0000 Encounter for general adult medical examination without abnormal findings: Secondary | ICD-10-CM

## 2021-01-27 DIAGNOSIS — E785 Hyperlipidemia, unspecified: Secondary | ICD-10-CM

## 2021-01-27 DIAGNOSIS — Z1329 Encounter for screening for other suspected endocrine disorder: Secondary | ICD-10-CM

## 2021-01-27 DIAGNOSIS — F3341 Major depressive disorder, recurrent, in partial remission: Secondary | ICD-10-CM

## 2021-01-27 DIAGNOSIS — Z0001 Encounter for general adult medical examination with abnormal findings: Secondary | ICD-10-CM

## 2021-01-27 DIAGNOSIS — Z1389 Encounter for screening for other disorder: Secondary | ICD-10-CM

## 2021-01-27 DIAGNOSIS — E559 Vitamin D deficiency, unspecified: Secondary | ICD-10-CM | POA: Diagnosis not present

## 2021-01-27 DIAGNOSIS — Z13220 Encounter for screening for lipoid disorders: Secondary | ICD-10-CM

## 2021-01-27 DIAGNOSIS — Z136 Encounter for screening for cardiovascular disorders: Secondary | ICD-10-CM

## 2021-01-27 DIAGNOSIS — R7309 Other abnormal glucose: Secondary | ICD-10-CM

## 2021-01-27 DIAGNOSIS — E0590 Thyrotoxicosis, unspecified without thyrotoxic crisis or storm: Secondary | ICD-10-CM

## 2021-01-27 MED ORDER — HYDROCHLOROTHIAZIDE 12.5 MG PO TABS: ORAL_TABLET | ORAL | 2 refills | Status: DC

## 2021-01-27 MED ORDER — METFORMIN HCL 500 MG PO TABS: 500.0000 mg | ORAL_TABLET | Freq: Two times a day (BID) | ORAL | 2 refills | Status: DC

## 2021-01-27 NOTE — Patient Instructions (Signed)
Metformin Extended-Release Tablets What is this medication? METFORMIN (met FOR min) treats type 2 diabetes. It controls blood sugar (glucose) and helps your body use insulin effectively. This medication is often combined with changes to diet and exercise. This medicine may be used for other purposes; ask your health care provider or pharmacist if you have questions. COMMON BRAND NAME(S): Fortamet, Glucophage XR, Glumetza What should I tell my care team before I take this medication? They need to know if you have any of these conditions: Anemia Dehydration Heart disease If you often drink alcohol Kidney disease Liver disease Polycystic ovary syndrome Serious infection or injury Vomiting An unusual or allergic reaction to metformin, other medications, foods, dyes, or preservatives Pregnant or trying to get pregnant Breast-feeding How should I use this medication? Take this medication by mouth with a glass of water. Follow the directions on the prescription label. Take this medication with food. Take your medication at regular intervals. Do not take your medication more often than directed. Do not stop taking except on your care team's advice. Talk to your care team about the use of this medication in children. Special care may be needed. Overdosage: If you think you have taken too much of this medicine contact a poison control center or emergency room at once. NOTE: This medicine is only for you. Do not share this medicine with others. What if I miss a dose? If you miss a dose, take it as soon as you can. If it is almost time for your next dose, take only that dose. Do not take double or extra doses. What may interact with this medication? Do not take this medication with any of the following: Certain contrast medications given before X-rays, CT scans, MRI, or other procedures Dofetilide This medication may also interact with the following: Acetazolamide Alcohol Certain antivirals for  HIV or hepatitis Certain medications for blood pressure, heart disease, irregular heart beat Cimetidine Dichlorphenamide Digoxin Diuretics Estrogens, progestins, or birth control pills Glycopyrrolate Isoniazid Lamotrigine Memantine Methazolamide Metoclopramide Midodrine Niacin Phenothiazines like chlorpromazine, mesoridazine, prochlorperazine, thioridazine Phenytoin Ranolazine Steroid medications like prednisone or cortisone Stimulant medications for attention disorders, weight loss, or to stay awake Thyroid medications Topiramate Trospium Vandetanib Zonisamide This list may not describe all possible interactions. Give your health care provider a list of all the medicines, herbs, non-prescription drugs, or dietary supplements you use. Also tell them if you smoke, drink alcohol, or use illegal drugs. Some items may interact with your medicine. What should I watch for while using this medication? Visit your care team for regular checks on your progress. A test called the HbA1C (A1C) will be monitored. This is a simple blood test. It measures your blood sugar control over the last 2 to 3 months. You will receive this test every 3 to 6 months. Using this medication with insulin or a sulfonylurea may increase your risk of hypoglycemia. Learn how to check your blood sugar. Learn the symptoms of low and high blood sugar and how to manage them. Always carry a quick-source of sugar with you in case you have symptoms of low blood sugar. Examples include hard sugar candy or glucose tablets. Make sure others know that you can choke if you eat or drink when you develop serious symptoms of low blood sugar, such as seizures or unconsciousness. They must get medical help at once. Tell your care team if you have high blood sugar. You might need to change the dose of your medication. If you are sick or  exercising more than usual, you might need to change the dose of your medication. Do not skip meals.  Ask your care team if you should avoid alcohol. Many nonprescription cough and cold products contain sugar or alcohol. These can affect blood sugar. This medication may cause ovulation in premenopausal women who do not have regular monthly periods. This may increase your chances of becoming pregnant. You should not take this medication if you become pregnant or think you may be pregnant. Talk with your care team about your birth control options while taking this medication. Contact your care team right away if you think you are pregnant. The tablet shell for some brands of this medication does not dissolve. This is normal. The tablet shell may appear whole in the stool. This is not a cause for concern. If you are going to need surgery, an MRI, CT scan, or other procedure, tell your health care provider that you are taking this medication. You may need to stop taking this medication before the procedure. Wear a medical ID bracelet or chain, and carry a card that describes your disease and details of your medication and dosage times. This medication may cause a decrease in folic acid and vitamin B12. You should make sure that you get enough vitamins while you are taking this medication. Discuss the foods you eat and the vitamins you take with your care team. What side effects may I notice from receiving this medication? Side effects that you should report to your care team as soon as possible: Allergic reactions-skin rash, itching, hives, swelling of the face, lips, tongue, or throat High lactic acid level-muscle pain or cramps, stomach pain, trouble breathing, general discomfort or fatigue Low vitamin B12 level-pain, tingling, or numbness in the hands or feet, muscle weakness, dizziness, confusion, difficulty concentrating Side effects that usually do not require medical attention (report to your care team if they continue or are bothersome): Diarrhea Gas Headache Metallic taste in mouth Nausea This  list may not describe all possible side effects. Call your doctor for medical advice about side effects. You may report side effects to FDA at 1-800-FDA-1088. Where should I keep my medication? Keep out of the reach of children and pets. Store at room temperature between 15 and 30 degrees C (59 and 86 degrees F). Protect from light. Get rid of any unused medication after the expiration date. To get rid of medications that are no longer needed or expired: Take the medication to a medication take-back program. Check with your pharmacy or law enforcement to find a location. If you cannot return the medication, check the label or package insert to see if the medication should be thrown out in the garbage or flushed down the toilet. If you are not sure, ask your care team. If it is safe to put in the trash, empty the medication out of the container. Mix the medication with cat litter, dirt, coffee grounds, or other unwanted substance. Seal the mixture in a bag or container. Put it in the trash. NOTE: This sheet is a summary. It may not cover all possible information. If you have questions about this medicine, talk to your doctor, pharmacist, or health care provider.  2022 Elsevier/Gold Standard (2020-03-16 10:29:57)

## 2021-01-28 LAB — COMPLETE METABOLIC PANEL WITH GFR
AG Ratio: 1.5 (calc) (ref 1.0–2.5)
ALT: 13 U/L (ref 6–29)
AST: 12 U/L (ref 10–35)
Albumin: 4.1 g/dL (ref 3.6–5.1)
Alkaline phosphatase (APISO): 70 U/L (ref 37–153)
BUN: 13 mg/dL (ref 7–25)
CO2: 29 mmol/L (ref 20–32)
Calcium: 9.3 mg/dL (ref 8.6–10.4)
Chloride: 105 mmol/L (ref 98–110)
Creat: 1.03 mg/dL (ref 0.50–1.03)
Globulin: 2.8 g/dL (calc) (ref 1.9–3.7)
Glucose, Bld: 93 mg/dL (ref 65–99)
Potassium: 4.3 mmol/L (ref 3.5–5.3)
Sodium: 141 mmol/L (ref 135–146)
Total Bilirubin: 0.4 mg/dL (ref 0.2–1.2)
Total Protein: 6.9 g/dL (ref 6.1–8.1)
eGFR: 63 mL/min/{1.73_m2} (ref >=60)

## 2021-01-28 LAB — CBC WITH DIFFERENTIAL/PLATELET
Absolute Monocytes: 429 cells/uL (ref 200–950)
Basophils Absolute: 41 cells/uL (ref 0–200)
Basophils Relative: 0.7 %
Eosinophils Absolute: 307 cells/uL (ref 15–500)
Eosinophils Relative: 5.3 %
HCT: 40.4 % (ref 35.0–45.0)
Hemoglobin: 13.3 g/dL (ref 11.7–15.5)
Lymphs Abs: 2198 cells/uL (ref 850–3900)
MCH: 27.8 pg (ref 27.0–33.0)
MCHC: 32.9 g/dL (ref 32.0–36.0)
MCV: 84.3 fL (ref 80.0–100.0)
MPV: 10.2 fL (ref 7.5–12.5)
Monocytes Relative: 7.4 %
Neutro Abs: 2825 cells/uL (ref 1500–7800)
Neutrophils Relative %: 48.7 %
Platelets: 321 10*3/uL (ref 140–400)
RBC: 4.79 10*6/uL (ref 3.80–5.10)
RDW: 13.5 % (ref 11.0–15.0)
Total Lymphocyte: 37.9 %
WBC: 5.8 10*3/uL (ref 3.8–10.8)

## 2021-01-28 LAB — URINALYSIS, ROUTINE W REFLEX MICROSCOPIC
Bilirubin Urine: NEGATIVE
Glucose, UA: NEGATIVE
Hgb urine dipstick: NEGATIVE
Ketones, ur: NEGATIVE
Leukocytes,Ua: NEGATIVE
Nitrite: NEGATIVE
Protein, ur: NEGATIVE
Specific Gravity, Urine: 1.02 (ref 1.001–1.035)
pH: 5.5 (ref 5.0–8.0)

## 2021-01-28 LAB — LIPID PANEL
Cholesterol: 165 mg/dL (ref <200)
HDL: 55 mg/dL (ref >=50)
LDL Cholesterol (Calc): 93 mg/dL (calc)
Non-HDL Cholesterol (Calc): 110 mg/dL (calc) (ref <130)
Total CHOL/HDL Ratio: 3 (calc) (ref <5.0)
Triglycerides: 79 mg/dL (ref <150)

## 2021-01-28 LAB — HEMOGLOBIN A1C
Hgb A1c MFr Bld: 6.2 % of total Hgb — ABNORMAL HIGH (ref <5.7)
Mean Plasma Glucose: 131 mg/dL
eAG (mmol/L): 7.3 mmol/L

## 2021-01-28 LAB — MAGNESIUM: Magnesium: 1.9 mg/dL (ref 1.5–2.5)

## 2021-01-28 LAB — TSH: TSH: 0.88 mIU/L (ref 0.40–4.50)

## 2021-01-28 LAB — MICROALBUMIN / CREATININE URINE RATIO
Creatinine, Urine: 163 mg/dL (ref 20–275)
Microalb Creat Ratio: 2 mcg/mg creat (ref <30)
Microalb, Ur: 0.3 mg/dL

## 2021-01-28 LAB — VITAMIN D 25 HYDROXY (VIT D DEFICIENCY, FRACTURES): Vit D, 25-Hydroxy: 75 ng/mL (ref 30–100)

## 2021-01-29 LAB — PAP, TP IMAGING W/ HPV RNA, RFLX HPV TYPE 16,18/45: HPV DNA High Risk: NOT DETECTED

## 2021-01-29 LAB — PAP, TP IMAGING, WNL RFLX HPV

## 2021-04-16 ENCOUNTER — Other Ambulatory Visit: Payer: Self-pay | Admitting: Nurse Practitioner

## 2021-04-16 DIAGNOSIS — R7309 Other abnormal glucose: Secondary | ICD-10-CM

## 2021-04-28 LAB — HM MAMMOGRAPHY

## 2021-05-25 ENCOUNTER — Encounter: Payer: Self-pay | Admitting: Internal Medicine

## 2021-06-27 ENCOUNTER — Other Ambulatory Visit: Payer: Self-pay | Admitting: Adult Health

## 2021-07-08 ENCOUNTER — Ambulatory Visit: Payer: BC Managed Care – PPO | Admitting: Nurse Practitioner

## 2021-07-15 NOTE — Progress Notes (Signed)
?FOLLOW UP 3 MONTH ? ?Assessment and Plan: ? ?Essential hypertension ?HCTZ 12.5mg  daily in am. ?- continue medications, DASH diet, exercise and monitor at home. Call if greater than 130/80.  ?- CBC with Differential/Platelet ?- CMP ? ?DM 2 with hyperlipidemia ?Has made life styles changes.  Discussed Metformin if A1c above goal. ?Discussed general issues about diabetes pathophysiology and management., Educational material distributed., Suggested low cholesterol diet., Encouraged aerobic exercise., Discussed foot care., Reminded to get yearly retinal exam. ?Patient was on farxiga, stopped with weight loss ?This visit discussed ozempic/reyblesus for weight loss/heart protection if she is back in DM range ?- Hemoglobin A1c ? ? ?Hyperlipidemia ?Continue: Simvastatin 40mg  and Zetia 10 ?Discussed dietary and exercise modifications ?Low fat diet ?-Lipids ?- TSH ?- CMP ? ?Morbid Obesity BMI 41 ?Discussed dietary and exercise modifications ?Discussed diet at length today. ? ?Vitamin D deficiency ?Continue supplementation to maintain goal of 70-100 ?Taking Vitamin D1,000 IU daily ?Defer vitamin D level ? ?Hyperthyroidism ?Currently on no medication ?-TSH ? ?Depression, recurrent, in remission ?Doing well ?Continue Wellbutrin 150mg  daily ? ?Other migraine without status migrainosus, not intractable ?Doing well ?Avoid triggers ? ?Medication management ?Continued ? ? ?Further disposition pending results if labs check today. Discussed med's effects and SE's.   ?Over 30 minutes of face to face interview, exam, counseling, chart review, and critical decision making was performed.  ? ?Future Appointments  ?Date Time Provider [place]  ?01/27/2022  2:00 PM Redell Nazir, Townsend Roger, NP [facility] None  ?07/28/2022  9:30 AM Demetra Shiner Townsend Roger, NP [facility] None  ? ? ? ?_______________________________________________________________________________________ ? ?HPI ?61 y.o. female  presents for a follow up HTN, HLD, depression, vitamin D  deficiency and weight. ? ? ?She is retired from [employer] in Sept, taking care of her husband, he has DM, CHF, cirrhosis and has spot on his lung- following with the [place] [facility]. This has caused some depression/issues but managing well with use of Buproprion. ? ?  ?Her blood pressure has been controlled at home, she is on HCTZ 12.5mg   daily, she gets leg cramps when she takes a whole pill, she has stopped the enlapril, today their BP is BP: 134/72  ?BP Readings from Last 3 Encounters:  ?07/17/21 134/72  ?01/27/21 132/86  ?11/20/20 130/90  ? ?BMI is Body mass index is 41.71 kg/m?., she is working on diet and exercise. She is down 6 pounds in the past 6 months ?Wt Readings from Last 6 Encounters:  ?07/17/21 243 lb (110.2 kg)  ?01/27/21 249 lb (112.9 kg)  ?11/20/20 242 lb 9.6 oz (110 kg)  ?07/21/20 242 lb (109.8 kg)  ?03/11/20 241 lb (109.3 kg)  ?01/28/20 243 lb (110.2 kg)  ? ? ?She does not workout, has been trying to walk. She denies chest pain, shortness of breath.  ?  ?She is on cholesterol medication, simvastatin 40mg  and zetia 10 mg and denies myalgias. Her cholesterol is at goal. No family history of CAD. The cholesterol last visit was:   ?Lab Results  ?Component Value Date  ? CHOL 165 01/27/2021  ? HDL 55 01/27/2021  ? [place] 93 01/27/2021  ? TRIG 79 01/27/2021  ? CHOLHDL 3.0 01/27/2021  ? ?She has been working on diet and exercise for diabetes, more walking ?She is not taking any medications, has taken farxiga in the past. ?With hyperlipidemia- she is on zetia and zocor 40 mg max amount.  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  ?She is on bASA. Last A1C in the office was:  ?Lab Results  ?Component Value Date  ? HGBA1C 6.2 (H) 01/27/2021  ? ?Patient is on Vitamin D supplement, 2500.  ?Lab Results  ?Component Value Date  ? VD25OH 75 01/27/2021  ? ?She has had hyperthyroidism,  she has had normal RAI uptakeand negative  thyroid BX.  ?Lab Results  ?Component Value Date  ? TSH 0.88 01/27/2021  ? ? ?Current Medications:  ? ?Current Outpatient Medications (Endocrine & Metabolic):  ?  metFORMIN (GLUCOPHAGE) 500 MG tablet, TAKE 1 TABLET BY MOUTH 2 TIMES DAILY WITH A MEAL. ? ?Current Outpatient Medications (Cardiovascular):  ?  ezetimibe (ZETIA) 10 MG tablet, TAKE 1 TABLET BY MOUTH EVERY DAY ?  hydrochlorothiazide (HYDRODIURIL) 12.5 MG tablet, Take 1 tablet daily for BP and fluid ?  simvastatin (ZOCOR) 40 MG tablet, TAKE 1 TABLET BY MOUTH EVERY DAY ? ?Current Outpatient Medications (Respiratory):  ?  albuterol (PROAIR HFA) 108 (90 Base) MCG/ACT inhaler, Inhale 2 puffs into the lungs every 6 (six) hours as needed for wheezing or shortness of breath. ?  azelastine (ASTELIN) 137 MCG/SPRAY nasal spray, Place 2 sprays into both nostrils 2 (two) times daily. Use in each nostril as directed ?  cetirizine (ZYRTEC) 10 MG tablet, Take 10 mg by mouth daily. ? ?Current Outpatient Medications (Analgesics):  ?  acetaminophen (TYLENOL) 500 MG tablet, Take 500 mg by mouth every 6 (six) hours as needed. 2 in the am ? ?Current Outpatient Medications (Hematological):  ?  vitamin B-12 (CYANOCOBALAMIN) 500 MCG tablet, Take 500 mcg by mouth daily. ? ?Current Outpatient Medications (Other):  ?  buPROPion (WELLBUTRIN XL) 150 MG 24 hr tablet, Take 1 tablet (150 mg total) by mouth every morning. ?  cholecalciferol (VITAMIN D) 1000 UNITS tablet, Take 10,000 Units by mouth daily. ?  cyclobenzaprine (FLEXERIL) 5 MG tablet, Take 1 tablet (5 mg total) by mouth 3 (three) times daily as needed for muscle spasms. ? ?Health Maintenance:   ?Immunization History  ?Administered Date(s) Administered  ? Influenza,inj,Quad PF,6+ Mos 01/27/2021  ? Moderna SARS-COV2 Booster Vaccination 04/29/2020  ? Moderna Sars-Covid-2 Vaccination 07/12/2019, 08/14/2019  ? Td 05/07/2007  ? Tdap 12/20/2017  ? ? ?Patient Care Team: ?Unk Pinto, MD as PCP - General (Internal Medicine) ?Richmond Campbell, MD as Consulting Physician (Gastroenterology) ?Mezer, Nadara Mustard, MD as Consulting Physician (Gynecology) ?Elsie Saas, MD as Consulting Physician (Orthopedic Surgery) ? ?Medical History:  ?Past Medical History:  ?Diagnosis Date  ? Allergy   ? Depression   ? Hyperlipidemia   ? Hypertension   ? Echo 2011 normal EF, trace MR  ? Migraines   ? Obesity   ? Prediabetes   ? Type 2 diabetes mellitus with hyperlipidemia ([place]) 10/15/2019  ? Vitamin D deficiency   ? ?Allergies ?Allergies  ?Allergen Reactions  ? Singulair [Montelukast Sodium] Swelling  ? Lactose Intolerance (Gi)   ? ? ?SURGICAL HISTORY ?She  has a past surgical history that includes Abdominal hysterectomy and Stapedectomy (Right, 2000). ?FAMILY HISTORY ?Her family history includes Arthritis in her mother; Asthma in her mother; Cancer (age of onset: 25) in her maternal grandfather; Cancer (age of onset: 56) in her maternal grandmother; Diabetes in her daughter and father; Hyperlipidemia in her father; Hypertension in her father, mother, and sister. ?SOCIAL HISTORY ?She  reports that she has never smoked. She has never used smokeless tobacco. She reports that she does not drink alcohol and does not use drugs. ? ?  Review of Systems  ?Constitutional: Negative.   ?HENT: Negative.   ?Eyes: Negative.   ?Respiratory: Negative.   ?Cardiovascular: Negative.   ?Gastrointestinal: Negative.   ?Genitourinary: Negative.   ?Musculoskeletal: Negative.   ?Skin: Negative.   ?Neurological: Negative for dizziness, tingling, tremors, sensory change, speech change, focal weakness, seizures, loss of consciousness and headaches.  ?Endo/Heme/Allergies: Negative.   ?Psychiatric/Behavioral: Negative.   ? ? ?Physical Exam: ?Estimated body mass index is 41.71 kg/m? as calculated from the following: ?  Height as of this encounter: 5\' 4"  (1.626 m). ?  Weight as of this encounter: 243 lb (110.2 kg). ?BP 134/72   Pulse 100   Temp 97.9 ?F (36.6 ?C)   Resp 16   Ht 5\' 4"  (1.626 m)   Wt  243 lb (110.2 kg)   SpO2 95%   BMI 41.71 kg/m?  ?Wt Readings from Last 3 Encounters:  ?07/17/21 243 lb (110.2 kg)  ?01/27/21 249 lb (112.9 kg)  ?11/20/20 242 lb 9.6 oz (110 kg)  ? ?General Appearance: Well nourish

## 2021-07-17 ENCOUNTER — Encounter: Payer: Self-pay | Admitting: Nurse Practitioner

## 2021-07-17 ENCOUNTER — Ambulatory Visit (INDEPENDENT_AMBULATORY_CARE_PROVIDER_SITE_OTHER): Payer: BC Managed Care – PPO | Admitting: Nurse Practitioner

## 2021-07-17 ENCOUNTER — Other Ambulatory Visit: Payer: Self-pay

## 2021-07-17 VITALS — BP 134/72 | HR 100 | Temp 97.9°F | Resp 16 | Ht 64.0 in | Wt 243.0 lb

## 2021-07-17 DIAGNOSIS — E1122 Type 2 diabetes mellitus with diabetic chronic kidney disease: Secondary | ICD-10-CM | POA: Diagnosis not present

## 2021-07-17 DIAGNOSIS — E785 Hyperlipidemia, unspecified: Secondary | ICD-10-CM

## 2021-07-17 DIAGNOSIS — I10 Essential (primary) hypertension: Secondary | ICD-10-CM

## 2021-07-17 DIAGNOSIS — F3341 Major depressive disorder, recurrent, in partial remission: Secondary | ICD-10-CM

## 2021-07-17 DIAGNOSIS — E1169 Type 2 diabetes mellitus with other specified complication: Secondary | ICD-10-CM

## 2021-07-17 DIAGNOSIS — N182 Chronic kidney disease, stage 2 (mild): Secondary | ICD-10-CM

## 2021-07-17 DIAGNOSIS — G43809 Other migraine, not intractable, without status migrainosus: Secondary | ICD-10-CM

## 2021-07-17 DIAGNOSIS — Z79899 Other long term (current) drug therapy: Secondary | ICD-10-CM

## 2021-07-17 DIAGNOSIS — E559 Vitamin D deficiency, unspecified: Secondary | ICD-10-CM

## 2021-07-17 DIAGNOSIS — E0590 Thyrotoxicosis, unspecified without thyrotoxic crisis or storm: Secondary | ICD-10-CM

## 2021-07-17 MED ORDER — METFORMIN HCL 500 MG PO TABS: 500.0000 mg | ORAL_TABLET | Freq: Two times a day (BID) | ORAL | 0 refills | Status: DC

## 2021-07-17 MED ORDER — EZETIMIBE 10 MG PO TABS: 10.0000 mg | ORAL_TABLET | Freq: Every day | ORAL | 1 refills | Status: DC

## 2021-07-20 ENCOUNTER — Other Ambulatory Visit: Payer: BC Managed Care – PPO

## 2021-07-20 ENCOUNTER — Other Ambulatory Visit: Payer: Self-pay

## 2021-07-20 DIAGNOSIS — E782 Mixed hyperlipidemia: Secondary | ICD-10-CM

## 2021-07-20 DIAGNOSIS — R7309 Other abnormal glucose: Secondary | ICD-10-CM

## 2021-07-21 LAB — CBC WITH DIFFERENTIAL/PLATELET
Absolute Monocytes: 497 cells/uL (ref 200–950)
Basophils Absolute: 50 cells/uL (ref 0–200)
Basophils Relative: 0.7 %
Eosinophils Absolute: 753 cells/uL — ABNORMAL HIGH (ref 15–500)
Eosinophils Relative: 10.6 %
HCT: 42.3 % (ref 35.0–45.0)
Hemoglobin: 14 g/dL (ref 11.7–15.5)
Lymphs Abs: 2258 cells/uL (ref 850–3900)
MCH: 28 pg (ref 27.0–33.0)
MCHC: 33.1 g/dL (ref 32.0–36.0)
MCV: 84.6 fL (ref 80.0–100.0)
MPV: 10.6 fL (ref 7.5–12.5)
Monocytes Relative: 7 %
Neutro Abs: 3543 cells/uL (ref 1500–7800)
Neutrophils Relative %: 49.9 %
Platelets: 315 10*3/uL (ref 140–400)
RBC: 5 10*6/uL (ref 3.80–5.10)
RDW: 13.2 % (ref 11.0–15.0)
Total Lymphocyte: 31.8 %
WBC: 7.1 10*3/uL (ref 3.8–10.8)

## 2021-07-21 LAB — COMPLETE METABOLIC PANEL WITH GFR
AG Ratio: 1.4 (calc) (ref 1.0–2.5)
ALT: 16 U/L (ref 6–29)
AST: 14 U/L (ref 10–35)
Albumin: 4.4 g/dL (ref 3.6–5.1)
Alkaline phosphatase (APISO): 79 U/L (ref 37–153)
BUN/Creatinine Ratio: 16 (calc) (ref 6–22)
BUN: 18 mg/dL (ref 7–25)
CO2: 25 mmol/L (ref 20–32)
Calcium: 9.8 mg/dL (ref 8.6–10.4)
Chloride: 101 mmol/L (ref 98–110)
Creat: 1.13 mg/dL — ABNORMAL HIGH (ref 0.50–1.03)
Globulin: 3.1 g/dL (calc) (ref 1.9–3.7)
Glucose, Bld: 113 mg/dL — ABNORMAL HIGH (ref 65–99)
Potassium: 4.2 mmol/L (ref 3.5–5.3)
Sodium: 138 mmol/L (ref 135–146)
Total Bilirubin: 0.4 mg/dL (ref 0.2–1.2)
Total Protein: 7.5 g/dL (ref 6.1–8.1)
eGFR: 56 mL/min/{1.73_m2} — ABNORMAL LOW (ref >=60)

## 2021-07-21 LAB — LIPID PANEL
Cholesterol: 167 mg/dL (ref <200)
HDL: 59 mg/dL (ref >=50)
LDL Cholesterol (Calc): 91 mg/dL (calc)
Non-HDL Cholesterol (Calc): 108 mg/dL (calc) (ref <130)
Total CHOL/HDL Ratio: 2.8 (calc) (ref <5.0)
Triglycerides: 82 mg/dL (ref <150)

## 2021-07-21 LAB — TSH: TSH: 1.01 mIU/L (ref 0.40–4.50)

## 2021-07-21 LAB — HEMOGLOBIN A1C
Hgb A1c MFr Bld: 6.4 % of total Hgb — ABNORMAL HIGH (ref <5.7)
Mean Plasma Glucose: 137 mg/dL
eAG (mmol/L): 7.6 mmol/L

## 2021-10-08 ENCOUNTER — Other Ambulatory Visit: Payer: Self-pay | Admitting: Adult Health

## 2021-10-08 ENCOUNTER — Other Ambulatory Visit: Payer: Self-pay | Admitting: Nurse Practitioner

## 2021-10-08 DIAGNOSIS — I10 Essential (primary) hypertension: Secondary | ICD-10-CM

## 2021-10-08 DIAGNOSIS — E785 Hyperlipidemia, unspecified: Secondary | ICD-10-CM

## 2021-10-08 DIAGNOSIS — F329 Major depressive disorder, single episode, unspecified: Secondary | ICD-10-CM

## 2021-10-08 DIAGNOSIS — Z0001 Encounter for general adult medical examination with abnormal findings: Secondary | ICD-10-CM

## 2021-10-14 ENCOUNTER — Other Ambulatory Visit: Payer: Self-pay | Admitting: Nurse Practitioner

## 2021-10-14 DIAGNOSIS — E1169 Type 2 diabetes mellitus with other specified complication: Secondary | ICD-10-CM

## 2022-01-27 ENCOUNTER — Encounter: Payer: BC Managed Care – PPO | Admitting: Nurse Practitioner

## 2022-02-17 NOTE — Progress Notes (Unsigned)
 Complete Physical  Assessment and Plan  Laura Branch was seen today for annual exam.  Diagnoses and all orders for this visit:  Encounter for general adult medical examination with abnormal findings  Due ANNUALLY  Essential hypertension -     CBC with Differential/Platelet - continue medications, DASH diet, exercise and monitor at home. Call if greater than 130/80.    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  Perform daily foot/skin check, notify office of any concerning changes.  Check A1C   Morbid obesity (HCC) -     TSH - Continue diet and exercise  Screening for hematuria or proteinuria -     Urinalysis, Routine w reflex microscopic -     Microalbumin / creatinine urine ratio  Screening, ischemic heart disease -     EKG 12-Lead  Screening for AAA - U/S abd retroperitoneal LTD  Medication management -     CBC with Differential/Platelet -     COMPLETE METABOLIC PANEL WITH GFR -     Lipid panel -     TSH -     Hemoglobin A1c -     VITAMIN D 25 Hydroxy (Vit-D Deficiency, Fractures) -     Magnesium -     EKG 12-Lead -     Urinalysis, Routine w reflex microscopic -     Microalbumin / creatinine urine ratio  Colon cancer screening - refer to GI for colonoscopy  Hot Flashes - [place] 45 mg QD Monitor   Vertigo - Meclizine 25 mg TID as needed  Flu Vaccine Need - Flu Vaccine Quad 6 mos + PF IM  Discussed med's effects and SE's. Screening labs and tests as requested with  regular follow-up as recommended.  HPI 61 y.o. female  presents for a complete physical.  She is retired from [employer].  Her husband, he is DM, CHF, cirrhosis and has spot on his lung- following with the [place] [facility]. Her husband is doing better  She has been having some vertigo episodes which has been occurring since last Wednesday, worse when lying flat.  She is also noting a worsening of hot flashes throughout the day and night sweats    Her blood pressure has been controlled at home, she is on HCTZ  25 mg 1/2 pill daily, she gets leg cramps when she takes a whole pill, today their BP is BP: 134/84  BP Readings from Last 3 Encounters:  02/18/22 134/84  07/17/21 134/72  01/27/21 132/86   BMI is Body mass index is 41.4 kg/m., she is working on diet and exercise. She is walking more. She had got down to 229 but has been eating a little more.  Wt Readings from Last 6 Encounters:  02/18/22 241 lb 3.2 oz (109.4 kg)  07/17/21 243 lb (110.2 kg)  01/27/21 249 lb (112.9 kg)  11/20/20 242 lb 9.6 oz (110 kg)  07/21/20 242 lb (109.8 kg)  03/11/20 241 lb (109.3 kg)    She does not workout, has been trying to  walk. She denies chest pain, shortness of breath.    She is on cholesterol medication, simvastatin 40mg  and zetia and denies myalgias. Her cholesterol is not at goal. No family history of CAD. The cholesterol last visit was:   Lab Results  Component Value Date   CHOL 167 07/20/2021   HDL 59 07/20/2021   LDLCALC 91 07/20/2021   TRIG 82 07/20/2021   CHOLHDL 2.8 07/20/2021   She has been working on diet and exercise for diabetes With hyperlipidemia- she is on zetia and zocor 40 mg max amount.  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  She is on bASA. Last A1C in the office was:  Lab Results  Component Value Date   HGBA1C 6.4 (H) 07/20/2021   Patient is on Vitamin D supplement, 2500.  Lab  Results  Component Value Date   VD25OH 6 01/27/2021   She has had hyperthyroidism, she has had normal RAI uptake and negative thyroid BX.  Lab Results  Component Value Date   TSH 1.01 07/20/2021   Future Appointments  Date Time Provider [place]  07/28/2022  9:30 AM Alycia Rossetti, NP [facility] None  02/21/2023  9:00 AM Alycia Rossetti, NP [facility] None     Current Medications:   Current Outpatient Medications (Endocrine & Metabolic):    metFORMIN (GLUCOPHAGE) 500 MG tablet, TAKE 1 TABLET BY MOUTH 2 TIMES DAILY WITH A MEAL.  Current Outpatient Medications (Cardiovascular):    ezetimibe (ZETIA) 10 MG tablet, Take 1 tablet (10 mg total) by mouth daily.   hydrochlorothiazide (HYDRODIURIL) 12.5 MG tablet, TAKE 1 TABLET DAILY FOR BLOOD PRESSURE AND FLUID   simvastatin (ZOCOR) 40 MG tablet, TAKE 1 TABLET BY MOUTH EVERY DAY  Current Outpatient Medications (Respiratory):    albuterol (PROAIR HFA) 108 (90 Base) MCG/ACT inhaler, Inhale 2 puffs into the lungs every 6 (six) hours as needed for wheezing or shortness of breath.   azelastine (ASTELIN) 137 MCG/SPRAY nasal spray, Place 2 sprays into both nostrils 2 (two) times daily. Use in each nostril as directed   cetirizine (ZYRTEC) 10 MG tablet, Take 10 mg by mouth daily.  Current Outpatient Medications (Analgesics):    acetaminophen (TYLENOL) 500 MG tablet, Take 500 mg by mouth every 6 (six) hours as needed. 2 in the am  Current Outpatient Medications (Hematological):    vitamin B-12 (CYANOCOBALAMIN) 500 MCG tablet, Take 500 mcg by mouth daily.  Current Outpatient Medications (Other):    buPROPion (WELLBUTRIN XL) 150 MG 24 hr tablet, TAKE 1 TABLET BY MOUTH EVERY DAY IN THE MORNING   cholecalciferol (VITAMIN D) 1000 UNITS tablet, Take 10,000 Units by mouth daily.   cyclobenzaprine (FLEXERIL) 5 MG tablet, Take 1 tablet (5 mg total) by mouth 3 (three) times daily as needed for muscle spasms.  Health Maintenance:    Immunization History  Administered Date(s) Administered   Influenza,inj,Quad PF,6+ Mos 01/27/2021   Moderna SARS-COV2 Booster Vaccination 04/29/2020   Moderna Sars-Covid-2 Vaccination 07/12/2019, 08/14/2019   Td 05/07/2007   Tdap 12/20/2017   Tetanus: 2019 Pneumovax: N/A Flu vaccine:(/27/22 today Zostavax: N/A Moderna: completed  Pap: 2022 Negative LMP: 2007 MGM: 12/127/22 DEXA: 2014 normal Colonoscopy: 2010 due 2020 Dr. Earlean Shawl Will call and schedule EGD: 01/2012- gastritis Echo: 2011 trace MR CT AB/Pelvis 06/2014 RAI thyroid uptake normal Nodule negative  Patient Care Team: Unk Pinto, MD as PCP - General (Internal Medicine) Richmond Campbell, MD  as Consulting Physician (Gastroenterology) Delila Pereyra, MD as Consulting Physician (Gynecology) Elsie Saas, MD as Consulting Physician (Orthopedic Surgery)  Medical History:  Past Medical History:  Diagnosis Date   Allergy    Depression    Hyperlipidemia    Hypertension    Echo 2011 normal EF, trace MR   Migraines    Obesity    Prediabetes    Type 2 diabetes mellitus with hyperlipidemia ([place]) 10/15/2019   Vitamin D deficiency    Allergies Allergies  Allergen Reactions   Singulair [Montelukast Sodium] Swelling   Lactose Intolerance (Gi)     SURGICAL HISTORY She  has a past surgical history that includes Abdominal hysterectomy and Stapedectomy (Right, 2000). FAMILY HISTORY Her family history includes Arthritis in her mother; Asthma in her mother; Cancer (age of onset: 52) in her maternal grandfather; Cancer (age of onset: 39) in her maternal grandmother; Diabetes in her daughter and father; Hyperlipidemia in her father; Hypertension in her father, mother, and sister. SOCIAL HISTORY She  reports that she has never smoked. She has never used smokeless tobacco. She reports that she does not drink alcohol and does not use drugs.  Review of Systems  Constitutional: Negative.  Negative for chills and fever.        Hot Flashes  HENT: Negative.  Negative for congestion, hearing loss, sinus pain, sore throat and tinnitus.   Eyes: Negative.  Negative for blurred vision and double vision.  Respiratory: Negative.  Negative for cough, hemoptysis, sputum production, shortness of breath and wheezing.   Cardiovascular: Negative.  Negative for chest pain, palpitations and leg swelling.  Gastrointestinal: Negative.  Negative for abdominal pain, constipation, diarrhea, heartburn, nausea and vomiting.  Genitourinary: Negative.  Negative for dysuria and urgency.  Musculoskeletal:  Positive for joint pain (riight wrist). Negative for back pain, falls, myalgias and neck pain.  Skin: Negative.  Negative for rash.  Neurological:  Positive for dizziness (vertigo). Negative for tingling, tremors, sensory change, speech change, focal weakness, seizures, loss of consciousness, weakness and headaches.  Endo/Heme/Allergies: Negative.  Does not bruise/bleed easily.  Psychiatric/Behavioral: Negative.  Negative for depression and suicidal ideas. The patient is not nervous/anxious and does not have insomnia.     Physical Exam: Estimated body mass index is 41.4 kg/m as calculated from the following:   Height as of this encounter: 5\' 4"  (1.626 m).   Weight as of this encounter: 241 lb 3.2 oz (109.4 kg). BP 134/84   Pulse 86   Temp 97.7 F (36.5 C)   Ht 5\' 4"  (1.626 m)   Wt 241 lb 3.2 oz (109.4 kg)   SpO2 99%   BMI 41.40 kg/m  Wt Readings from Last 3 Encounters:  02/18/22 241 lb 3.2 oz (109.4 kg)  07/17/21 243 lb (110.2 kg)  01/27/21 249 lb (112.9 kg)   General Appearance: Well nourished, in no apparent distress. Eyes: PERRLA, EOMs, conjunctiva no swelling or erythema, normal fundi and vessels. Sinuses: No Frontal/maxillary tenderness ENT/Mouth: Ext aud canals clear, normal light reflex with TMs without erythema, bulging.  Good dentition. No erythema, swelling, or exudate on post pharynx. Tonsils not swollen or  erythematous. Hearing normal.  Neck: Supple, thyroid normal. No bruits Respiratory: Respiratory effort normal, BS equal bilaterally without rales, rhonchi, wheezing or stridor. Cardio: RRR without murmurs, rubs or gallops. Brisk peripheral pulses without edema.  Chest: symmetric, with normal excursions and percussion. Breasts: breasts appear normal, no suspicious masses, no skin or nipple changes or axillary nodes.  Abdomen: Soft, +BS. Non  tender, no guarding, rebound, hernias, masses, or organomegaly. . Lymphatics: Non tender without lymphadenopathy.  Musculoskeletal: Full ROM all peripheral extremities,5/5 strength, and normal gait. Skin: Warm, dry without rashes, lesions, ecchymosis.  Neuro: Cranial nerves intact, reflexes equal bilaterally. Normal muscle tone, no cerebellar symptoms. Sensation intact.  Psych: Awake and oriented X 3, normal affect, Insight and Judgment appropriate.   EKG: NSR, no ST changes AORTA SCAN:  < 3 cm   Allyssa Abruzzese E  9:07 AM

## 2022-02-18 ENCOUNTER — Encounter: Payer: Self-pay | Admitting: Nurse Practitioner

## 2022-02-18 ENCOUNTER — Ambulatory Visit (INDEPENDENT_AMBULATORY_CARE_PROVIDER_SITE_OTHER): Payer: BC Managed Care – PPO | Admitting: Nurse Practitioner

## 2022-02-18 VITALS — BP 134/84 | HR 86 | Temp 97.7°F | Ht 64.0 in | Wt 241.2 lb

## 2022-02-18 DIAGNOSIS — Z79899 Other long term (current) drug therapy: Secondary | ICD-10-CM | POA: Diagnosis not present

## 2022-02-18 DIAGNOSIS — Z0001 Encounter for general adult medical examination with abnormal findings: Secondary | ICD-10-CM

## 2022-02-18 DIAGNOSIS — F3341 Major depressive disorder, recurrent, in partial remission: Secondary | ICD-10-CM

## 2022-02-18 DIAGNOSIS — Z0000 Encounter for general adult medical examination without abnormal findings: Secondary | ICD-10-CM

## 2022-02-18 DIAGNOSIS — Z13220 Encounter for screening for lipoid disorders: Secondary | ICD-10-CM

## 2022-02-18 DIAGNOSIS — E0590 Thyrotoxicosis, unspecified without thyrotoxic crisis or storm: Secondary | ICD-10-CM

## 2022-02-18 DIAGNOSIS — Z136 Encounter for screening for cardiovascular disorders: Secondary | ICD-10-CM | POA: Diagnosis not present

## 2022-02-18 DIAGNOSIS — E1122 Type 2 diabetes mellitus with diabetic chronic kidney disease: Secondary | ICD-10-CM

## 2022-02-18 DIAGNOSIS — R42 Dizziness and giddiness: Secondary | ICD-10-CM

## 2022-02-18 DIAGNOSIS — E1169 Type 2 diabetes mellitus with other specified complication: Secondary | ICD-10-CM

## 2022-02-18 DIAGNOSIS — I700 Atherosclerosis of aorta: Secondary | ICD-10-CM

## 2022-02-18 DIAGNOSIS — Z23 Encounter for immunization: Secondary | ICD-10-CM

## 2022-02-18 DIAGNOSIS — E559 Vitamin D deficiency, unspecified: Secondary | ICD-10-CM | POA: Diagnosis not present

## 2022-02-18 DIAGNOSIS — Z1389 Encounter for screening for other disorder: Secondary | ICD-10-CM

## 2022-02-18 DIAGNOSIS — Z131 Encounter for screening for diabetes mellitus: Secondary | ICD-10-CM

## 2022-02-18 DIAGNOSIS — R232 Flushing: Secondary | ICD-10-CM

## 2022-02-18 DIAGNOSIS — I10 Essential (primary) hypertension: Secondary | ICD-10-CM

## 2022-02-18 DIAGNOSIS — Z1211 Encounter for screening for malignant neoplasm of colon: Secondary | ICD-10-CM

## 2022-02-18 MED ORDER — MECLIZINE HCL 25 MG PO TABS: ORAL_TABLET | ORAL | 0 refills | Status: DC

## 2022-02-18 MED ORDER — VEOZAH 45 MG PO TABS: 45.0000 mg | ORAL_TABLET | Freq: Every day | ORAL | 11 refills | Status: DC

## 2022-02-18 NOTE — Patient Instructions (Signed)

## 2022-02-19 LAB — MICROALBUMIN / CREATININE URINE RATIO
Creatinine, Urine: 234 mg/dL (ref 20–275)
Microalb Creat Ratio: 2 mcg/mg creat (ref <30)
Microalb, Ur: 0.4 mg/dL

## 2022-02-19 LAB — HEMOGLOBIN A1C
Hgb A1c MFr Bld: 6.5 % of total Hgb — ABNORMAL HIGH (ref <5.7)
Mean Plasma Glucose: 140 mg/dL
eAG (mmol/L): 7.7 mmol/L

## 2022-02-19 LAB — COMPLETE METABOLIC PANEL WITHOUT GFR
AG Ratio: 1.4 (calc) (ref 1.0–2.5)
ALT: 11 U/L (ref 6–29)
AST: 10 U/L (ref 10–35)
Albumin: 4.3 g/dL (ref 3.6–5.1)
Alkaline phosphatase (APISO): 69 U/L (ref 37–153)
BUN/Creatinine Ratio: 13 (calc) (ref 6–22)
BUN: 14 mg/dL (ref 7–25)
CO2: 28 mmol/L (ref 20–32)
Calcium: 9.8 mg/dL (ref 8.6–10.4)
Chloride: 103 mmol/L (ref 98–110)
Creat: 1.06 mg/dL — ABNORMAL HIGH (ref 0.50–1.03)
Globulin: 3 g/dL (ref 1.9–3.7)
Glucose, Bld: 99 mg/dL (ref 65–99)
Potassium: 4.2 mmol/L (ref 3.5–5.3)
Sodium: 140 mmol/L (ref 135–146)
Total Bilirubin: 0.4 mg/dL (ref 0.2–1.2)
Total Protein: 7.3 g/dL (ref 6.1–8.1)
eGFR: 61 mL/min/1.73m2

## 2022-02-19 LAB — CBC WITH DIFFERENTIAL/PLATELET
Absolute Monocytes: 314 cells/uL (ref 200–950)
Basophils Absolute: 28 cells/uL (ref 0–200)
Basophils Relative: 0.5 %
Eosinophils Absolute: 413 cells/uL (ref 15–500)
Eosinophils Relative: 7.5 %
HCT: 41.2 % (ref 35.0–45.0)
Hemoglobin: 13.9 g/dL (ref 11.7–15.5)
Lymphs Abs: 2618 cells/uL (ref 850–3900)
MCH: 28.5 pg (ref 27.0–33.0)
MCHC: 33.7 g/dL (ref 32.0–36.0)
MCV: 84.4 fL (ref 80.0–100.0)
MPV: 10.2 fL (ref 7.5–12.5)
Monocytes Relative: 5.7 %
Neutro Abs: 2129 cells/uL (ref 1500–7800)
Neutrophils Relative %: 38.7 %
Platelets: 335 10*3/uL (ref 140–400)
RBC: 4.88 10*6/uL (ref 3.80–5.10)
RDW: 13.4 % (ref 11.0–15.0)
Total Lymphocyte: 47.6 %
WBC: 5.5 10*3/uL (ref 3.8–10.8)

## 2022-02-19 LAB — LIPID PANEL
Cholesterol: 191 mg/dL
HDL: 61 mg/dL
LDL Cholesterol (Calc): 113 mg/dL — ABNORMAL HIGH
Non-HDL Cholesterol (Calc): 130 mg/dL — ABNORMAL HIGH
Total CHOL/HDL Ratio: 3.1 (calc)
Triglycerides: 74 mg/dL

## 2022-02-19 LAB — VITAMIN D 25 HYDROXY (VIT D DEFICIENCY, FRACTURES): Vit D, 25-Hydroxy: 93 ng/mL (ref 30–100)

## 2022-02-19 LAB — URINALYSIS, ROUTINE W REFLEX MICROSCOPIC
Bilirubin Urine: NEGATIVE
Glucose, UA: NEGATIVE
Hgb urine dipstick: NEGATIVE
Ketones, ur: NEGATIVE
Leukocytes,Ua: NEGATIVE
Nitrite: NEGATIVE
Protein, ur: NEGATIVE
Specific Gravity, Urine: 1.023 (ref 1.001–1.035)
pH: 5.5 (ref 5.0–8.0)

## 2022-02-19 LAB — TSH: TSH: 0.67 m[IU]/L (ref 0.40–4.50)

## 2022-02-19 LAB — MAGNESIUM: Magnesium: 2 mg/dL (ref 1.5–2.5)

## 2022-04-29 LAB — HM MAMMOGRAPHY

## 2022-04-30 ENCOUNTER — Encounter: Payer: Self-pay | Admitting: Internal Medicine

## 2022-05-20 NOTE — Progress Notes (Signed)
FOLLOW UP 3 MONTH  Assessment and Plan:  Essential hypertension HCTZ 12.5mg  daily in am. - continue medications, DASH diet, exercise and monitor at home. Call if greater than 130/80.  - CBC with Differential/Platelet - CMP  DM 2 with hyperlipidemia Has made life styles changes.  Discussed Metformin if A1c above goal. Discussed general issues about diabetes pathophysiology and management., Educational material distributed., Suggested low cholesterol diet., Encouraged aerobic exercise., Discussed foot care., Reminded to get yearly retinal exam. 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  Vitamin D deficiency Continue supplementation to maintain goal of 70-100 Taking Vitamin D1,000 IU daily Defer vitamin D level  Hyperthyroidism Currently on no medication -TSH  Depression, recurrent, in remission Doing well Continue Wellbutrin 150mg  daily  Other migraine without status migrainosus, not intractable Doing well Avoid triggers  Medication management Continued   Further disposition pending results if labs check today. Discussed med's effects and SE's.   Over 30 minutes of face to face interview, exam, counseling, chart review, and critical decision making was performed.   Future Appointments  Date Time Provider Department Center  05/21/2022  9:30 AM , NP [facility] None  07/28/2022  9:30 AM [facility] Dick, NP [facility] None  02/21/2023  9:00 AM Raynelle Dick, NP [facility] None      _______________________________________________________________________________________  HPI 62 y.o. female  presents for a follow up HTN, HLD, depression, vitamin D deficiency and weight.   She is retired from [employer] in Sept, taking care of her husband, he has DM, CHF, cirrhosis and has spot on his lung- following with the [employer] hospital. This has caused some depression/issues but managing well with use of Buproprion.  She was started on Veozah in 01/2022 for persistent hot flashes and night sweats.  Medication is working well and has greatly reduced the hot flashes incidence.    Her blood pressure has been controlled at home, she is on HCTZ 12.5mg   daily, she gets leg cramps when she takes a whole pill, she has stopped the enlapril, today their BP is BP: 136/80  BP Readings from Last 3 Encounters:  05/21/22 136/80  02/18/22 134/84  07/17/21 134/72   BMI is Body mass index is 41.54 kg/m., she is working on diet and exercise. Wt Readings from Last 6 Encounters:  05/21/22 242 lb (109.8 kg)  02/18/22 241 lb 3.2 oz (109.4 kg)  07/17/21 243 lb (110.2 kg)  01/27/21 249 lb (112.9 kg)  11/20/20 242 lb 9.6 oz (110 kg)  07/21/20 242 lb (109.8 kg)    She does not workout, has been trying to walk. She denies chest pain, shortness of breath.    She is on cholesterol medication, simvastatin 40mg  and zetia 10 mg and denies myalgias. Her cholesterol is at goal. No family history of CAD. The cholesterol last visit was:   Lab Results  Component Value Date   CHOL 191 02/18/2022   HDL 61 02/18/2022   LDLCALC 113 (H) 02/18/2022   TRIG 74 02/18/2022   CHOLHDL 3.1 02/18/2022   She has been working on  diet and exercise for diabetes, more walking She is not taking any medications, has taken farxiga in the past. With hyperlipidemia- she is on zetia and zocor 40 mg max amount.  CKD stage 2 not on ACE or ARB  Fasting blood sugar are 120's and denies paresthesia of the feet, polydipsia  and polyuria.  She is on bASA. Last A1C in the office was:  Lab Results  Component Value Date   HGBA1C 6.5 (H) 02/18/2022   Patient is on Vitamin D supplement, 2500.  Lab Results  Component Value Date   VD25OH 93 02/18/2022   She has had hyperthyroidism,  she has had normal RAI uptakeand negative thyroid BX.  Lab Results  Component Value Date   TSH 0.67 02/18/2022    Current Medications:   Current Outpatient Medications (Endocrine & Metabolic):    Fezolinetant (VEOZAH) 45 MG TABS, Take 45 mg by mouth daily.   metFORMIN (GLUCOPHAGE) 500 MG tablet, TAKE 1 TABLET BY MOUTH 2 TIMES DAILY WITH A MEAL.  Current Outpatient Medications (Cardiovascular):    ezetimibe (ZETIA) 10 MG tablet, Take 1 tablet (10 mg total) by mouth daily.   hydrochlorothiazide (HYDRODIURIL) 12.5 MG tablet, TAKE 1 TABLET DAILY FOR BLOOD PRESSURE AND FLUID   simvastatin (ZOCOR) 40 MG tablet, TAKE 1 TABLET BY MOUTH EVERY DAY  Current Outpatient Medications (Respiratory):    albuterol (PROAIR HFA) 108 (90 Base) MCG/ACT inhaler, Inhale 2 puffs into the lungs every 6 (six) hours as needed for wheezing or shortness of breath.   azelastine (ASTELIN) 137 MCG/SPRAY nasal spray, Place 2 sprays into both nostrils 2 (two) times daily. Use in each nostril as directed   cetirizine (ZYRTEC) 10 MG tablet, Take 10 mg by mouth daily.  Current Outpatient Medications (Analgesics):    acetaminophen (TYLENOL) 500 MG tablet, Take 500 mg by mouth every 6 (six) hours as needed. 2 in the am  Current Outpatient Medications (Hematological):    vitamin B-12 (CYANOCOBALAMIN) 500 MCG tablet, Take 500 mcg by mouth daily.  Current Outpatient Medications (Other):    buPROPion (WELLBUTRIN XL) 150 MG 24 hr tablet, TAKE 1 TABLET BY MOUTH EVERY DAY IN THE MORNING   cholecalciferol (VITAMIN D) 1000 UNITS tablet, Take 10,000 Units by mouth daily.   cyclobenzaprine (FLEXERIL) 5 MG tablet, Take 1 tablet (5 mg total) by mouth 3 (three) times daily as  needed for muscle spasms.   meclizine (ANTIVERT) 25 MG tablet, 1/2-1 pill up to 3 times daily for motion sickness/dizziness  Health Maintenance:   Immunization History  Administered Date(s) Administered   Influenza,inj,Quad PF,6+ Mos 01/27/2021, 02/18/2022   Moderna SARS-COV2 Booster Vaccination 04/29/2020   Moderna Sars-Covid-2 Vaccination 07/12/2019, 08/14/2019   Td 05/07/2007   Tdap 12/20/2017    Patient Care Team: Unk Pinto, MD as PCP - General (Internal Medicine) Richmond Campbell, MD as Consulting Physician (Gastroenterology) Delila Pereyra, MD as Consulting Physician (Gynecology) Elsie Saas, MD as Consulting Physician (Orthopedic Surgery)  Medical History:  Past Medical History:  Diagnosis Date   Allergy    Depression    Hyperlipidemia    Hypertension    Echo 2011 normal EF, trace MR   Migraines    Obesity    Prediabetes    Type 2 diabetes mellitus with hyperlipidemia ([facility]) 10/15/2019   Vitamin D deficiency    Allergies Allergies  Allergen Reactions   Singulair [Montelukast Sodium] Swelling   Lactose Intolerance (Gi)     SURGICAL HISTORY She  has a past surgical history that includes Abdominal  hysterectomy and Stapedectomy (Right, 2000). FAMILY HISTORY Her family history includes Arthritis in her mother; Asthma in her mother; Cancer (age of onset: 11) in her maternal grandfather; Cancer (age of onset: 72) in her maternal grandmother; Diabetes in her daughter and father; Hyperlipidemia in her father; Hypertension in her father, mother, and sister. SOCIAL HISTORY She  reports that she has never smoked. She has never used smokeless tobacco. She reports that she does not drink alcohol and does not use drugs.  Review of Systems  Constitutional: Negative.   HENT: Negative.   Eyes: Negative.   Respiratory: Negative.   Cardiovascular: Negative.   Gastrointestinal: Negative.   Genitourinary: Negative.   Musculoskeletal: Negative.   Skin: Negative.    Neurological: Negative for dizziness, tingling, tremors, sensory change, speech change, focal weakness, seizures, loss of consciousness and headaches.  Endo/Heme/Allergies: Negative.   Psychiatric/Behavioral: Negative.     Physical Exam: Estimated body mass index is 41.54 kg/m as calculated from the following:   Height as of this encounter: 5\' 4"  (1.626 m).   Weight as of this encounter: 242 lb (109.8 kg). BP 136/80   Pulse 99   Temp 97.9 F (36.6 C)   Resp 17   Ht 5\' 4"  (1.626 m)   Wt 242 lb (109.8 kg)   SpO2 97%   BMI 41.54 kg/m  Wt Readings from Last 3 Encounters:  05/21/22 242 lb (109.8 kg)  02/18/22 241 lb 3.2 oz (109.4 kg)  07/17/21 243 lb (110.2 kg)   General Appearance: Well nourished, in no apparent distress. Eyes: PERRLA, EOMs, conjunctiva no swelling or erythema, normal fundi and vessels. Sinuses: No Frontal/maxillary tenderness Neck: Supple, thyroid normal. No bruits Respiratory: Respiratory effort normal, BS equal bilaterally without rales, rhonchi, wheezing or stridor. Cardio: RRR without murmurs, rubs or gallops. Brisk peripheral pulses without edema.  Chest: symmetric, with normal excursions and percussion. Abdomen: Soft, +BS. Non tender, no guarding, rebound, hernias, masses, or organomegaly.  Lymphatics: Non tender without lymphadenopathy.  Musculoskeletal: Full ROM all peripheral extremities,5/5 strength, and normal gait. Skin: Warm, dry without rashes, lesions, ecchymosis. 4x55mm nevus unchanged right medial foot Neuro: Cranial nerves intact, reflexes equal bilaterally. Normal muscle tone, no cerebellar symptoms. Sensation intact.  Psych: Awake and oriented X 3, normal affect, Insight and Judgment appropriate.    Magda Bernheim ANP-C  [facility]  05/21/2022

## 2022-05-21 ENCOUNTER — Encounter: Payer: Self-pay | Admitting: Nurse Practitioner

## 2022-05-21 ENCOUNTER — Ambulatory Visit: Payer: BC Managed Care – PPO | Admitting: Nurse Practitioner

## 2022-05-21 VITALS — BP 136/80 | HR 99 | Temp 97.9°F | Resp 17 | Ht 64.0 in | Wt 242.0 lb

## 2022-05-21 DIAGNOSIS — E1169 Type 2 diabetes mellitus with other specified complication: Secondary | ICD-10-CM

## 2022-05-21 DIAGNOSIS — I10 Essential (primary) hypertension: Secondary | ICD-10-CM

## 2022-05-21 DIAGNOSIS — N182 Chronic kidney disease, stage 2 (mild): Secondary | ICD-10-CM

## 2022-05-21 DIAGNOSIS — E559 Vitamin D deficiency, unspecified: Secondary | ICD-10-CM

## 2022-05-21 DIAGNOSIS — G43809 Other migraine, not intractable, without status migrainosus: Secondary | ICD-10-CM

## 2022-05-21 DIAGNOSIS — E1122 Type 2 diabetes mellitus with diabetic chronic kidney disease: Secondary | ICD-10-CM | POA: Diagnosis not present

## 2022-05-21 DIAGNOSIS — E785 Hyperlipidemia, unspecified: Secondary | ICD-10-CM

## 2022-05-21 DIAGNOSIS — F3341 Major depressive disorder, recurrent, in partial remission: Secondary | ICD-10-CM

## 2022-05-21 DIAGNOSIS — E0590 Thyrotoxicosis, unspecified without thyrotoxic crisis or storm: Secondary | ICD-10-CM

## 2022-05-21 DIAGNOSIS — Z79899 Other long term (current) drug therapy: Secondary | ICD-10-CM

## 2022-05-21 NOTE — Patient Instructions (Signed)

## 2022-05-22 LAB — COMPLETE METABOLIC PANEL WITH GFR
AG Ratio: 1.4 (calc) (ref 1.0–2.5)
ALT: 9 U/L (ref 6–29)
AST: 11 U/L (ref 10–35)
Albumin: 4.2 g/dL (ref 3.6–5.1)
Alkaline phosphatase (APISO): 72 U/L (ref 37–153)
BUN: 12 mg/dL (ref 7–25)
CO2: 27 mmol/L (ref 20–32)
Calcium: 9.4 mg/dL (ref 8.6–10.4)
Chloride: 104 mmol/L (ref 98–110)
Creat: 0.98 mg/dL (ref 0.50–1.05)
Globulin: 3 g/dL (calc) (ref 1.9–3.7)
Glucose, Bld: 106 mg/dL — ABNORMAL HIGH (ref 65–99)
Potassium: 4.1 mmol/L (ref 3.5–5.3)
Sodium: 140 mmol/L (ref 135–146)
Total Bilirubin: 0.4 mg/dL (ref 0.2–1.2)
Total Protein: 7.2 g/dL (ref 6.1–8.1)
eGFR: 66 mL/min/{1.73_m2} (ref >=60)

## 2022-05-22 LAB — LIPID PANEL
Cholesterol: 178 mg/dL (ref <200)
HDL: 61 mg/dL (ref >=50)
LDL Cholesterol (Calc): 99 mg/dL (calc)
Non-HDL Cholesterol (Calc): 117 mg/dL (calc) (ref <130)
Total CHOL/HDL Ratio: 2.9 (calc) (ref <5.0)
Triglycerides: 87 mg/dL (ref <150)

## 2022-05-22 LAB — CBC WITH DIFFERENTIAL/PLATELET
Absolute Monocytes: 296 cells/uL (ref 200–950)
Basophils Absolute: 31 cells/uL (ref 0–200)
Basophils Relative: 0.6 %
Eosinophils Absolute: 411 cells/uL (ref 15–500)
Eosinophils Relative: 7.9 %
HCT: 39.5 % (ref 35.0–45.0)
Hemoglobin: 13.3 g/dL (ref 11.7–15.5)
Lymphs Abs: 2262 cells/uL (ref 850–3900)
MCH: 28.3 pg (ref 27.0–33.0)
MCHC: 33.7 g/dL (ref 32.0–36.0)
MCV: 84 fL (ref 80.0–100.0)
MPV: 9.9 fL (ref 7.5–12.5)
Monocytes Relative: 5.7 %
Neutro Abs: 2200 cells/uL (ref 1500–7800)
Neutrophils Relative %: 42.3 %
Platelets: 308 10*3/uL (ref 140–400)
RBC: 4.7 10*6/uL (ref 3.80–5.10)
RDW: 13.1 % (ref 11.0–15.0)
Total Lymphocyte: 43.5 %
WBC: 5.2 10*3/uL (ref 3.8–10.8)

## 2022-05-22 LAB — HEMOGLOBIN A1C
Hgb A1c MFr Bld: 6.5 % of total Hgb — ABNORMAL HIGH (ref <5.7)
Mean Plasma Glucose: 140 mg/dL
eAG (mmol/L): 7.7 mmol/L

## 2022-05-22 LAB — TSH: TSH: 0.83 mIU/L (ref 0.40–4.50)

## 2022-06-03 ENCOUNTER — Other Ambulatory Visit: Payer: Self-pay | Admitting: Nurse Practitioner

## 2022-06-03 DIAGNOSIS — E785 Hyperlipidemia, unspecified: Secondary | ICD-10-CM

## 2022-07-28 ENCOUNTER — Other Ambulatory Visit: Payer: Self-pay | Admitting: Nurse Practitioner

## 2022-07-28 ENCOUNTER — Ambulatory Visit: Payer: BC Managed Care – PPO | Admitting: Nurse Practitioner

## 2022-07-28 DIAGNOSIS — E1169 Type 2 diabetes mellitus with other specified complication: Secondary | ICD-10-CM

## 2022-07-28 DIAGNOSIS — R42 Dizziness and giddiness: Secondary | ICD-10-CM

## 2022-08-19 NOTE — Progress Notes (Signed)
FOLLOW UP 3 MONTH  Assessment and Plan:  Essential hypertension BP has been low, in 70-100/50-70 and had associated dizziness- stop HCTZ and monitorBP , if consistently greater than 130/80 restart HCTZ 12.5 mg - continue medications, DASH diet, exercise and monitor at home. Call if greater than 130/80.  - CBC with Differential/Platelet - CMP  DM 2 with hyperlipidemia Has made life styles changes.  Discussed Metformin if A1c above goal. Discussed general issues about diabetes pathophysiology and management., Educational material distributed., Suggested low cholesterol diet., Encouraged aerobic exercise., Discussed foot care., Reminded to get yearly retinal exam. 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  Vitamin D deficiency Continue supplementation to maintain goal of 70-100 Taking Vitamin D1,000 IU daily Defer vitamin D level  Hot Flashes Continue on Veozah  Hyperthyroidism Currently on no medication -TSH  Depression, recurrent, in remission Doing well Continue Wellbutrin  daily  Other migraine without status migrainosus, not intractable Doing well Avoid triggers  Medication management Continued  Vertigo Continue meclizine Monitor symptoms  Further disposition pending results if labs check today. Discussed med's effects and SE's.   Over 30 minutes of face to face interview, exam, counseling, chart review, and critical decision making was performed.   Future  Appointments  Date Time Provider Department Center  08/20/2022  9:30 AM [facility], NP [facility] None  02/21/2023  9:00 AM [facility], NP [facility] None     _______________________________________________________________________________________  HPI 62 y.o. female  presents for a follow up HTN, HLD, depression, vitamin D deficiency and weight.  She had vertigo Mon, Tues, Wednesday all through the day- BP was low those days.  The symptoms have now resolved. She did take Meclizine which did help  She is retired from [employer] , taking care of her husband, he has DM, CHF, cirrhosis and has spot on his lung- following with the [place] [facility]. This has caused some depression/issues but managing well with use of Buproprion.  She was started on Veozah in 01/2022 for persistent hot flashes and night sweats.  Medication is working well and has greatly reduced the hot flashes incidence.    She is on HCTZ 12.5mg   daily, BP has been running low at home 70-100/50-70, today their BP is BP: 112/72  BP Readings from Last 3 Encounters:  08/20/22 112/72  05/21/22 136/80  02/18/22 134/84   BMI is Body mass index is 41.13 kg/m., she is working on diet and exercise. She has been doing more exercise- walking.  Less sodas and simple carbs, no snacking.  Wt Readings from Last 6 Encounters:  08/20/22 239 lb 9.6 oz (108.7 kg)  05/21/22 242 lb (109.8 kg)  02/18/22 241 lb 3.2 oz (109.4 kg)  07/17/21 243 lb (110.2 kg)  01/27/21 249 lb (112.9 kg)  11/20/20 242 lb 9.6 oz (110 kg)  She does workout, has been trying to walk. She denies chest pain, shortness of breath.    She is on cholesterol medication, simvastatin  and zetia 10 mg and denies myalgias. Her cholesterol is at goal.  No family history of CAD. The cholesterol last visit was:   Lab Results  Component Value Date   CHOL 178 05/21/2022   HDL 61 05/21/2022   LDLCALC 99 05/21/2022   TRIG 87 05/21/2022   CHOLHDL 2.9 05/21/2022    She has been working on diet and exercise for diabetes, more walking She is not taking any medications, has taken farxiga in the past. With hyperlipidemia- she is on zetia and zocor 40 mg max amount.  CKD stage 2 not on ACE or ARB  Fasting blood sugar are 120's and denies paresthesia of the feet, polydipsia and polyuria.  She is on bASA. Last A1C in the office was:  Lab Results  Component Value Date   HGBA1C 6.5 (H) 05/21/2022   Patient is on Vitamin D supplement, 2500.  Lab Results  Component Value Date   VD25OH 93 02/18/2022   She has had hyperthyroidism,  she has had normal RAI uptakeand negative thyroid BX.  Lab Results  Component Value Date   TSH 0.83 05/21/2022    Current Medications:   Current Outpatient Medications (Endocrine & Metabolic):    Fezolinetant (VEOZAH) 45 MG TABS, Take 45 mg by mouth daily.   metFORMIN (GLUCOPHAGE) 500 MG tablet, TAKE 1 TABLET BY MOUTH 2 TIMES DAILY WITH A MEAL.  Current Outpatient Medications (Cardiovascular):    ezetimibe (ZETIA) 10 MG tablet, TAKE 1 TABLET BY MOUTH EVERY DAY   hydrochlorothiazide (HYDRODIURIL) 12.5 MG tablet, TAKE 1 TABLET DAILY FOR BLOOD PRESSURE AND FLUID   simvastatin (ZOCOR) 40 MG tablet, TAKE 1 TABLET BY MOUTH EVERY DAY  Current Outpatient Medications (Respiratory):    albuterol (PROAIR HFA) 108 (90 Base) MCG/ACT inhaler, Inhale 2 puffs into the lungs every 6 (six) hours as needed for wheezing or shortness of breath.   azelastine (ASTELIN) 137 MCG/SPRAY nasal spray, Place 2 sprays into both nostrils 2 (two) times daily. Use in each nostril as directed   cetirizine (ZYRTEC) 10 MG tablet, Take 10 mg by mouth daily.  Current Outpatient Medications (Analgesics):    acetaminophen (TYLENOL) 500 MG tablet, Take 500 mg by mouth every 6 (six) hours as needed. 2 in the am  Current Outpatient Medications (Hematological):    vitamin B-12 (CYANOCOBALAMIN) 500 MCG tablet, Take 500 mcg by mouth daily.  Current Outpatient  Medications (Other):    buPROPion (WELLBUTRIN XL) 150 MG 24 hr tablet, TAKE 1 TABLET BY MOUTH EVERY DAY IN THE MORNING   cholecalciferol (VITAMIN D) 1000 UNITS tablet, Take 10,000 Units by mouth daily.   cyclobenzaprine (FLEXERIL) 5 MG tablet, Take 1 tablet (5 mg total) by mouth 3 (three) times daily as needed for muscle spasms.   meclizine (ANTIVERT) 25 MG tablet, 1/2-1 PILL UP TO 3 TIMES DAILY FOR MOTION SICKNESS/DIZZINESS  Health Maintenance:   Immunization History  Administered Date(s) Administered   Influenza,inj,Quad PF,6+ Mos 01/27/2021, 02/18/2022   Moderna SARS-COV2 Booster Vaccination 04/29/2020   Moderna Sars-Covid-2 Vaccination 07/12/2019, 08/14/2019   Td 05/07/2007   Tdap 12/20/2017    Patient Care Team: Lucky Cowboy, MD as PCP - General (Internal Medicine) Sharrell Ku, MD as Consulting Physician (Gastroenterology) Teodora Medici, MD as Consulting Physician (Gynecology) Salvatore Marvel, MD as Consulting Physician (Orthopedic Surgery)  Medical History:  Past Medical History:  Diagnosis Date   Allergy    Depression    Hyperlipidemia    Hypertension    Echo 2011 normal EF, trace MR   Migraines    Obesity    Prediabetes  Type 2 diabetes mellitus with hyperlipidemia 10/15/2019   Vitamin D deficiency    Allergies Allergies  Allergen Reactions   Singulair [Montelukast Sodium] Swelling   Lactose Intolerance (Gi)     SURGICAL HISTORY She  has a past surgical history that includes Abdominal hysterectomy and Stapedectomy (Right, 2000). FAMILY HISTORY Her family history includes Arthritis in her mother; Asthma in her mother; Cancer (age of onset: 66) in her maternal grandfather; Cancer (age of onset: 26) in her maternal grandmother; Diabetes in her daughter and father; Hyperlipidemia in her father; Hypertension in her father, mother, and sister. SOCIAL HISTORY She  reports that she has never smoked. She has never used smokeless tobacco. She reports that she  does not drink alcohol and does not use drugs.  Review of Systems  Constitutional:  Negative for chills, fever and weight loss.  HENT:  Negative for congestion and hearing loss.   Eyes:  Negative for blurred vision and double vision.  Respiratory:  Negative for cough and shortness of breath.   Cardiovascular:  Negative for chest pain, palpitations, orthopnea and leg swelling.  Gastrointestinal:  Negative for abdominal pain, constipation, diarrhea, heartburn, nausea and vomiting.  Musculoskeletal:  Negative for falls, joint pain and myalgias.  Skin:  Negative for rash.  Neurological:  Positive for dizziness. Negative for tingling, tremors, loss of consciousness and headaches.  Psychiatric/Behavioral:  Negative for depression, memory loss and suicidal ideas.       Physical Exam: Estimated body mass index is 41.13 kg/m as calculated from the following:   Height as of this encounter: 5\' 4"  (1.626 m).   Weight as of this encounter: 239 lb 9.6 oz (108.7 kg). BP 112/72   Pulse 93   Temp 97.7 F (36.5 C)   Ht 5\' 4"  (1.626 m)   Wt 239 lb 9.6 oz (108.7 kg)   SpO2 98%   BMI 41.13 kg/m  Wt Readings from Last 3 Encounters:  08/20/22 239 lb 9.6 oz (108.7 kg)  05/21/22 242 lb (109.8 kg)  02/18/22 241 lb 3.2 oz (109.4 kg)   General Appearance: Very pleasant morbidly obese female, in no apparent distress. Eyes: PERRLA, EOMs, conjunctiva no swelling or erythema, normal fundi and vessels. Sinuses: No Frontal/maxillary tenderness Neck: Supple, thyroid normal. No bruits Respiratory: Respiratory effort normal, BS equal bilaterally without rales, rhonchi, wheezing or stridor. Cardio: RRR without murmurs, rubs or gallops. Brisk peripheral pulses without edema.  Chest: symmetric, with normal excursions and percussion. Abdomen: Soft, +BS. Non tender, no guarding, rebound, hernias, masses, or organomegaly.  Lymphatics: Non tender without lymphadenopathy.  Musculoskeletal: Full ROM all peripheral  extremities,5/5 strength, and normal gait. Skin: Warm, dry without rashes, lesions, ecchymosis. 4x7mm nevus unchanged right medial foot Neuro: Cranial nerves intact, reflexes equal bilaterally. Normal muscle tone, no cerebellar symptoms. Sensation intact.  Psych: Awake and oriented X 3, normal affect, Insight and Judgment appropriate.    Revonda Humphrey ANP-C  [facility]  08/20/2022

## 2022-08-20 ENCOUNTER — Encounter: Payer: Self-pay | Admitting: Nurse Practitioner

## 2022-08-20 ENCOUNTER — Ambulatory Visit: Payer: BC Managed Care – PPO | Admitting: Nurse Practitioner

## 2022-08-20 VITALS — BP 112/72 | HR 93 | Temp 97.7°F | Ht 64.0 in | Wt 239.6 lb

## 2022-08-20 DIAGNOSIS — R42 Dizziness and giddiness: Secondary | ICD-10-CM

## 2022-08-20 DIAGNOSIS — E1169 Type 2 diabetes mellitus with other specified complication: Secondary | ICD-10-CM | POA: Diagnosis not present

## 2022-08-20 DIAGNOSIS — R232 Flushing: Secondary | ICD-10-CM

## 2022-08-20 DIAGNOSIS — E0590 Thyrotoxicosis, unspecified without thyrotoxic crisis or storm: Secondary | ICD-10-CM

## 2022-08-20 DIAGNOSIS — E785 Hyperlipidemia, unspecified: Secondary | ICD-10-CM

## 2022-08-20 DIAGNOSIS — E1122 Type 2 diabetes mellitus with diabetic chronic kidney disease: Secondary | ICD-10-CM

## 2022-08-20 DIAGNOSIS — G43809 Other migraine, not intractable, without status migrainosus: Secondary | ICD-10-CM

## 2022-08-20 DIAGNOSIS — F3341 Major depressive disorder, recurrent, in partial remission: Secondary | ICD-10-CM

## 2022-08-20 DIAGNOSIS — E559 Vitamin D deficiency, unspecified: Secondary | ICD-10-CM

## 2022-08-20 DIAGNOSIS — N182 Chronic kidney disease, stage 2 (mild): Secondary | ICD-10-CM

## 2022-08-20 DIAGNOSIS — Z79899 Other long term (current) drug therapy: Secondary | ICD-10-CM

## 2022-08-20 DIAGNOSIS — I10 Essential (primary) hypertension: Secondary | ICD-10-CM

## 2022-08-20 NOTE — Patient Instructions (Signed)
Stop Hydochlorothiazide- continue to monitor BP once a day, if consistently greater than 130/80 restart medication

## 2022-08-21 LAB — COMPLETE METABOLIC PANEL WITH GFR
AG Ratio: 1.5 (calc) (ref 1.0–2.5)
ALT: 10 U/L (ref 6–29)
AST: 11 U/L (ref 10–35)
Albumin: 4.2 g/dL (ref 3.6–5.1)
Alkaline phosphatase (APISO): 74 U/L (ref 37–153)
BUN/Creatinine Ratio: 11 (calc) (ref 6–22)
BUN: 13 mg/dL (ref 7–25)
CO2: 27 mmol/L (ref 20–32)
Calcium: 9.7 mg/dL (ref 8.6–10.4)
Chloride: 104 mmol/L (ref 98–110)
Creat: 1.16 mg/dL — ABNORMAL HIGH (ref 0.50–1.05)
Globulin: 2.8 g/dL (calc) (ref 1.9–3.7)
Glucose, Bld: 110 mg/dL — ABNORMAL HIGH (ref 65–99)
Potassium: 4 mmol/L (ref 3.5–5.3)
Sodium: 141 mmol/L (ref 135–146)
Total Bilirubin: 0.4 mg/dL (ref 0.2–1.2)
Total Protein: 7 g/dL (ref 6.1–8.1)
eGFR: 54 mL/min/{1.73_m2} — ABNORMAL LOW (ref >=60)

## 2022-08-21 LAB — CBC WITH DIFFERENTIAL/PLATELET
Absolute Monocytes: 302 cells/uL (ref 200–950)
Basophils Absolute: 32 cells/uL (ref 0–200)
Basophils Relative: 0.6 %
Eosinophils Absolute: 475 cells/uL (ref 15–500)
Eosinophils Relative: 8.8 %
HCT: 40 % (ref 35.0–45.0)
Hemoglobin: 13.5 g/dL (ref 11.7–15.5)
Lymphs Abs: 2192 cells/uL (ref 850–3900)
MCH: 27.9 pg (ref 27.0–33.0)
MCHC: 33.8 g/dL (ref 32.0–36.0)
MCV: 82.6 fL (ref 80.0–100.0)
MPV: 10.1 fL (ref 7.5–12.5)
Monocytes Relative: 5.6 %
Neutro Abs: 2398 cells/uL (ref 1500–7800)
Neutrophils Relative %: 44.4 %
Platelets: 318 10*3/uL (ref 140–400)
RBC: 4.84 10*6/uL (ref 3.80–5.10)
RDW: 13.7 % (ref 11.0–15.0)
Total Lymphocyte: 40.6 %
WBC: 5.4 10*3/uL (ref 3.8–10.8)

## 2022-08-21 LAB — LIPID PANEL
Cholesterol: 159 mg/dL (ref <200)
HDL: 58 mg/dL (ref >=50)
LDL Cholesterol (Calc): 84 mg/dL (calc)
Non-HDL Cholesterol (Calc): 101 mg/dL (calc) (ref <130)
Total CHOL/HDL Ratio: 2.7 (calc) (ref <5.0)
Triglycerides: 76 mg/dL (ref <150)

## 2022-08-21 LAB — TSH: TSH: 0.63 mIU/L (ref 0.40–4.50)

## 2022-08-21 LAB — HEMOGLOBIN A1C
Hgb A1c MFr Bld: 6.5 % of total Hgb — ABNORMAL HIGH (ref <5.7)
Mean Plasma Glucose: 140 mg/dL
eAG (mmol/L): 7.7 mmol/L

## 2022-10-25 ENCOUNTER — Other Ambulatory Visit: Payer: Self-pay

## 2022-10-25 DIAGNOSIS — E1169 Type 2 diabetes mellitus with other specified complication: Secondary | ICD-10-CM

## 2022-10-25 MED ORDER — METFORMIN HCL 500 MG PO TABS: 500.0000 mg | ORAL_TABLET | Freq: Two times a day (BID) | ORAL | 1 refills | Status: DC

## 2022-10-27 ENCOUNTER — Other Ambulatory Visit: Payer: Self-pay | Admitting: Nurse Practitioner

## 2022-10-27 DIAGNOSIS — R42 Dizziness and giddiness: Secondary | ICD-10-CM

## 2022-10-28 ENCOUNTER — Other Ambulatory Visit: Payer: Self-pay | Admitting: Nurse Practitioner

## 2022-10-28 DIAGNOSIS — E1169 Type 2 diabetes mellitus with other specified complication: Secondary | ICD-10-CM

## 2022-11-22 ENCOUNTER — Other Ambulatory Visit: Payer: Self-pay | Admitting: Nurse Practitioner

## 2022-11-22 DIAGNOSIS — Z0001 Encounter for general adult medical examination with abnormal findings: Secondary | ICD-10-CM

## 2022-11-22 DIAGNOSIS — F329 Major depressive disorder, single episode, unspecified: Secondary | ICD-10-CM

## 2023-02-21 ENCOUNTER — Encounter: Payer: BC Managed Care – PPO | Admitting: Nurse Practitioner

## 2023-02-22 NOTE — Progress Notes (Unsigned)
 Complete Physical  Assessment and Plan  Lucille was seen today for annual exam.  Diagnoses and all orders for this visit:  Encounter for general adult medical examination with abnormal findings  Due ANNUALLY  Essential hypertension -     CBC with Differential/Platelet - continue medications, DASH diet, exercise and monitor at home. Call if greater than 130/80.    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  Perform daily foot/skin check, notify office of any concerning changes.  Check A1C   Type 2 Diabetes Mellitus with Morbid obesity (HCC) -     TSH - Continue diet and exercise  Screening for hematuria or proteinuria -     Urinalysis, Routine w reflex microscopic -     Microalbumin / creatinine urine ratio  Screening, ischemic heart disease -     EKG 12-Lead  Screening for AAA - U/S abd retroperitoneal LTD  Medication management -     CBC with Differential/Platelet -     COMPLETE METABOLIC PANEL WITH GFR -     Lipid panel -     TSH -     Hemoglobin A1c -     VITAMIN D 25 Hydroxy (Vit-D Deficiency, Fractures) -     Magnesium -     EKG 12-Lead -     Urinalysis, Routine w reflex microscopic -     Microalbumin / creatinine urine ratio  Colon cancer screening - refer to GI for colonoscopy***  Hot Flashes - Veozah 45 mg QD Monitor   Vertigo - Meclizine 25 mg TID as needed  Flu Vaccine Need - Flu Vaccine Quad 6 mos + PF IM  Discussed med's effects and SE's. Screening  labs and tests as requested with regular follow-up as recommended.  HPI 62 y.o. female  presents for a complete physical.  She is retired from [employer].  Her husband, he is DM, CHF, cirrhosis and has spot on his lung- [facility] Her husband is doing better  She has been having some vertigo episodes which has been occurring since last Wednesday, worse when lying flat.  She is also noting a worsening of hot flashes throughout the day and night sweats    Her blood pressure has been controlled at home, she is on HCTZ  25 mg 1/2 pill daily, she gets leg cramps when she takes a whole pill, today their BP is    BP Readings from Last 3 Encounters:  08/20/22 112/72  05/21/22 136/80  02/18/22 134/84   BMI is There is no height or weight on file to calculate BMI., she is working on diet and exercise. She is walking more. She had got down to 229 but has been eating a little more.  Wt Readings from Last 6 Encounters:  08/20/22 239 lb 9.6 oz (108.7 kg)  05/21/22 242 lb (109.8 kg)  02/18/22 241 lb 3.2 oz (109.4 kg)  07/17/21 243 lb (110.2 kg)  01/27/21 249 lb (112.9 kg)  11/20/20 242 lb 9.6 oz (110  kg)    She does not workout, has been trying to walk. She denies chest pain, shortness of breath.    She is on cholesterol medication, simvastatin 40mg  and zetia and denies myalgias. Her cholesterol is not at goal. No family history of CAD. The cholesterol last visit was:   Lab Results  Component Value Date   CHOL 159 08/20/2022   HDL 58 08/20/2022   LDLCALC 84 08/20/2022   TRIG 76 08/20/2022   CHOLHDL 2.7 08/20/2022   She has been working on diet and exercise for diabetes With hyperlipidemia- she is on zetia and zocor 40 mg max amount.  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  She is on bASA. Last A1C in the office was:  Lab Results  Component Value Date   HGBA1C 6.5 (H)  08/20/2022   Patient is on Vitamin D supplement, 2500.  Lab Results  Component Value Date   VD25OH 93 02/18/2022   She has had hyperthyroidism, she has had normal RAI uptake and negative thyroid BX.  Lab Results  Component Value Date   TSH 0.63 08/20/2022   Future Appointments  Date Time Provider Department Center  02/23/2023  3:00 PM Raynelle Dick, NP [facility] None  02/23/2024  3:00 PM Raynelle Dick, NP [facility] None     Current Medications:   Current Outpatient Medications (Endocrine & Metabolic):    Fezolinetant (VEOZAH) 45 MG TABS, Take 45 mg by mouth daily.   metFORMIN (GLUCOPHAGE) 500 MG tablet, Take 1 tablet (500 mg total) by mouth 2 (two) times daily with a meal.  Current Outpatient Medications (Cardiovascular):    ezetimibe (ZETIA) 10 MG tablet, TAKE 1 TABLET BY MOUTH EVERY DAY   hydrochlorothiazide (HYDRODIURIL) 12.5 MG tablet, TAKE 1 TABLET DAILY FOR BLOOD PRESSURE AND FLUID   simvastatin (ZOCOR) 40 MG tablet, TAKE 1 TABLET BY MOUTH EVERY DAY  Current Outpatient Medications (Respiratory):    albuterol (PROAIR HFA) 108 (90 Base) MCG/ACT inhaler, Inhale 2 puffs into the lungs every 6 (six) hours as needed for wheezing or shortness of breath.   azelastine (ASTELIN) 137 MCG/SPRAY nasal spray, Place 2 sprays into both nostrils 2 (two) times daily. Use in each nostril as directed   cetirizine (ZYRTEC) 10 MG tablet, Take 10 mg by mouth daily.  Current Outpatient Medications (Analgesics):    acetaminophen (TYLENOL) 500 MG tablet, Take 500 mg by mouth every 6 (six) hours as needed. 2 in the am  Current Outpatient Medications (Hematological):    vitamin B-12 (CYANOCOBALAMIN) 500 MCG tablet, Take 500 mcg by mouth daily.  Current Outpatient Medications (Other):    buPROPion (WELLBUTRIN XL) 150 MG 24 hr tablet, TAKE 1 TABLET BY MOUTH EVERY DAY IN THE MORNING   cholecalciferol (VITAMIN D) 1000 UNITS tablet, Take 10,000 Units by mouth daily.   cyclobenzaprine  (FLEXERIL) 5 MG tablet, Take 1 tablet (5 mg total) by mouth 3 (three) times daily as needed for muscle spasms.   meclizine (ANTIVERT) 25 MG tablet, TAKE 1/2 (HALF) - 1 TABLET UP TO 3 TIMES DAILY FOR MOTION SICKNESS/DIZZINESS  Health Maintenance:   Immunization History  Administered Date(s) Administered   Influenza,inj,Quad PF,6+ Mos 01/27/2021, 02/18/2022   Moderna SARS-COV2 Booster Vaccination 04/29/2020   Moderna Sars-Covid-2 Vaccination 07/12/2019, 08/14/2019   Td 05/07/2007   Tdap 12/20/2017   Tetanus: 2019 Pneumovax: N/A Flu vaccine:(/27/22 today Zostavax: N/A Moderna: completed  Pap: 2022 Negative  LMP: 2007 MGM: 12/127/22 DEXA: 2014 normal Colonoscopy: 2010 due 2020 Dr. Kinnie Scales Will call and schedule EGD: 01/2012- gastritis Echo: 2011 trace MR CT AB/Pelvis 06/2014 RAI thyroid uptake normal Nodule negative  Patient Care Team: Lucky Cowboy, MD as PCP - General (Internal Medicine) Sharrell Ku, MD as Consulting Physician (Gastroenterology) Chevis Pretty, Dimas Aguas, MD as Consulting Physician (Gynecology) Salvatore Marvel, MD as Consulting Physician (Orthopedic Surgery)  Medical History:  Past Medical History:  Diagnosis Date   Allergy    Depression    Hyperlipidemia    Hypertension    Echo 2011 normal EF, trace MR   Migraines    Obesity    Prediabetes    Type 2 diabetes mellitus with hyperlipidemia (HCC) 10/15/2019   Vitamin D deficiency    Allergies Allergies  Allergen Reactions   Singulair [Montelukast Sodium] Swelling   Lactose Intolerance (Gi)     SURGICAL HISTORY She  has a past surgical history that includes Abdominal hysterectomy and Stapedectomy (Right, 2000). FAMILY HISTORY Her family history includes Arthritis in her mother; Asthma in her mother; Cancer (age of onset: 57) in her maternal grandfather; Cancer (age of onset: 32) in her maternal grandmother; Diabetes in her daughter and father; Hyperlipidemia in her father; Hypertension in her father,  mother, and sister. SOCIAL HISTORY She  reports that she has never smoked. She has never used smokeless tobacco. She reports that she does not drink alcohol and does not use drugs.  Review of Systems  Constitutional: Negative.  Negative for chills and fever.       Hot Flashes  HENT: Negative.  Negative for congestion, hearing loss, sinus pain, sore throat and tinnitus.   Eyes: Negative.  Negative for blurred vision and double vision.  Respiratory: Negative.  Negative for cough, hemoptysis, sputum production, shortness of breath and wheezing.   Cardiovascular: Negative.  Negative for chest pain, palpitations and leg swelling.  Gastrointestinal: Negative.  Negative for abdominal pain, constipation, diarrhea, heartburn, nausea and vomiting.  Genitourinary: Negative.  Negative for dysuria and urgency.  Musculoskeletal:  Positive for joint pain (riight wrist). Negative for back pain, falls, myalgias and neck pain.  Skin: Negative.  Negative for rash.  Neurological:  Positive for dizziness (vertigo). Negative for tingling, tremors, sensory change, speech change, focal weakness, seizures, loss of consciousness, weakness and headaches.  Endo/Heme/Allergies: Negative.  Does not bruise/bleed easily.  Psychiatric/Behavioral: Negative.  Negative for depression and suicidal ideas. The patient is not nervous/anxious and does not have insomnia.     Physical Exam: Estimated body mass index is 41.13 kg/m as calculated from the following:   Height as of 08/20/22: 5\' 4"  (1.626 m).   Weight as of 08/20/22: 239 lb 9.6 oz (108.7 kg). There were no vitals taken for this visit. Wt Readings from Last 3 Encounters:  08/20/22 239 lb 9.6 oz (108.7 kg)  05/21/22 242 lb (109.8 kg)  02/18/22 241 lb 3.2 oz (109.4 kg)   General Appearance: Well nourished, in no apparent distress. Eyes: PERRLA, EOMs, conjunctiva no swelling or erythema, normal fundi and vessels. Sinuses: No Frontal/maxillary tenderness ENT/Mouth: Ext  aud canals clear, normal light reflex with TMs without erythema, bulging.  Good dentition. No erythema, swelling, or exudate on post pharynx. Tonsils not swollen or erythematous. Hearing normal.  Neck: Supple, thyroid normal. No bruits Respiratory: Respiratory effort normal, BS equal bilaterally without rales, rhonchi, wheezing or stridor. Cardio: RRR without murmurs, rubs or gallops. Brisk peripheral pulses without edema.  Chest: symmetric, with normal excursions and percussion. Breasts: breasts  appear normal, no suspicious masses, no skin or nipple changes or axillary nodes.  Abdomen: Soft, +BS. Non tender, no guarding, rebound, hernias, masses, or organomegaly. . Lymphatics: Non tender without lymphadenopathy.  Musculoskeletal: Full ROM all peripheral extremities,5/5 strength, and normal gait. Skin: Warm, dry without rashes, lesions, ecchymosis.  Neuro: Cranial nerves intact, reflexes equal bilaterally. Normal muscle tone, no cerebellar symptoms. Sensation intact.  Psych: Awake and oriented X 3, normal affect, Insight and Judgment appropriate.   EKG: NSR, no ST changes AORTA SCAN:  < 3 cm   Cory Kitt E  12:58 PM

## 2023-02-23 ENCOUNTER — Ambulatory Visit (INDEPENDENT_AMBULATORY_CARE_PROVIDER_SITE_OTHER): Payer: BC Managed Care – PPO | Admitting: Nurse Practitioner

## 2023-02-23 ENCOUNTER — Encounter: Payer: Self-pay | Admitting: Nurse Practitioner

## 2023-02-23 VITALS — BP 130/70 | HR 76 | Temp 97.5°F | Ht 63.0 in | Wt 247.2 lb

## 2023-02-23 DIAGNOSIS — Z1389 Encounter for screening for other disorder: Secondary | ICD-10-CM | POA: Diagnosis not present

## 2023-02-23 DIAGNOSIS — F3341 Major depressive disorder, recurrent, in partial remission: Secondary | ICD-10-CM

## 2023-02-23 DIAGNOSIS — Z0000 Encounter for general adult medical examination without abnormal findings: Secondary | ICD-10-CM

## 2023-02-23 DIAGNOSIS — Z79899 Other long term (current) drug therapy: Secondary | ICD-10-CM

## 2023-02-23 DIAGNOSIS — R42 Dizziness and giddiness: Secondary | ICD-10-CM

## 2023-02-23 DIAGNOSIS — I700 Atherosclerosis of aorta: Secondary | ICD-10-CM | POA: Diagnosis not present

## 2023-02-23 DIAGNOSIS — I10 Essential (primary) hypertension: Secondary | ICD-10-CM

## 2023-02-23 DIAGNOSIS — Z131 Encounter for screening for diabetes mellitus: Secondary | ICD-10-CM

## 2023-02-23 DIAGNOSIS — E559 Vitamin D deficiency, unspecified: Secondary | ICD-10-CM

## 2023-02-23 DIAGNOSIS — Z23 Encounter for immunization: Secondary | ICD-10-CM

## 2023-02-23 DIAGNOSIS — Z13220 Encounter for screening for lipoid disorders: Secondary | ICD-10-CM | POA: Diagnosis not present

## 2023-02-23 DIAGNOSIS — Z136 Encounter for screening for cardiovascular disorders: Secondary | ICD-10-CM

## 2023-02-23 DIAGNOSIS — E0590 Thyrotoxicosis, unspecified without thyrotoxic crisis or storm: Secondary | ICD-10-CM

## 2023-02-23 DIAGNOSIS — R232 Flushing: Secondary | ICD-10-CM

## 2023-02-23 DIAGNOSIS — Z0001 Encounter for general adult medical examination with abnormal findings: Secondary | ICD-10-CM

## 2023-02-23 DIAGNOSIS — Z1211 Encounter for screening for malignant neoplasm of colon: Secondary | ICD-10-CM

## 2023-02-23 DIAGNOSIS — E1169 Type 2 diabetes mellitus with other specified complication: Secondary | ICD-10-CM

## 2023-02-23 DIAGNOSIS — E1122 Type 2 diabetes mellitus with diabetic chronic kidney disease: Secondary | ICD-10-CM

## 2023-02-23 NOTE — Patient Instructions (Signed)

## 2023-02-24 ENCOUNTER — Other Ambulatory Visit: Payer: Self-pay | Admitting: Nurse Practitioner

## 2023-02-24 DIAGNOSIS — I10 Essential (primary) hypertension: Secondary | ICD-10-CM

## 2023-02-24 DIAGNOSIS — R42 Dizziness and giddiness: Secondary | ICD-10-CM

## 2023-02-24 LAB — COMPLETE METABOLIC PANEL WITH GFR
AG Ratio: 1.4 (calc) (ref 1.0–2.5)
ALT: 13 U/L (ref 6–29)
AST: 12 U/L (ref 10–35)
Albumin: 4.4 g/dL (ref 3.6–5.1)
Alkaline phosphatase (APISO): 74 U/L (ref 37–153)
BUN: 12 mg/dL (ref 7–25)
CO2: 29 mmol/L (ref 20–32)
Calcium: 9.8 mg/dL (ref 8.6–10.4)
Chloride: 105 mmol/L (ref 98–110)
Creat: 0.96 mg/dL (ref 0.50–1.05)
Globulin: 3.1 g/dL (ref 1.9–3.7)
Glucose, Bld: 91 mg/dL (ref 65–99)
Potassium: 4 mmol/L (ref 3.5–5.3)
Sodium: 141 mmol/L (ref 135–146)
Total Bilirubin: 0.3 mg/dL (ref 0.2–1.2)
Total Protein: 7.5 g/dL (ref 6.1–8.1)
eGFR: 68 mL/min/{1.73_m2} (ref >=60)

## 2023-02-24 LAB — CBC WITH DIFFERENTIAL/PLATELET
Absolute Lymphocytes: 2586 {cells}/uL (ref 850–3900)
Absolute Monocytes: 442 {cells}/uL (ref 200–950)
Basophils Absolute: 27 {cells}/uL (ref 0–200)
Basophils Relative: 0.4 %
Eosinophils Absolute: 382 {cells}/uL (ref 15–500)
Eosinophils Relative: 5.7 %
HCT: 41.7 % (ref 35.0–45.0)
Hemoglobin: 13.8 g/dL (ref 11.7–15.5)
MCH: 28.2 pg (ref 27.0–33.0)
MCHC: 33.1 g/dL (ref 32.0–36.0)
MCV: 85.1 fL (ref 80.0–100.0)
MPV: 10.2 fL (ref 7.5–12.5)
Monocytes Relative: 6.6 %
Neutro Abs: 3263 {cells}/uL (ref 1500–7800)
Neutrophils Relative %: 48.7 %
Platelets: 336 10*3/uL (ref 140–400)
RBC: 4.9 10*6/uL (ref 3.80–5.10)
RDW: 13.5 % (ref 11.0–15.0)
Total Lymphocyte: 38.6 %
WBC: 6.7 10*3/uL (ref 3.8–10.8)

## 2023-02-24 LAB — LIPID PANEL
Cholesterol: 251 mg/dL — ABNORMAL HIGH (ref <200)
HDL: 64 mg/dL (ref >=50)
LDL Cholesterol (Calc): 164 mg/dL — ABNORMAL HIGH
Non-HDL Cholesterol (Calc): 187 mg/dL — ABNORMAL HIGH (ref <130)
Total CHOL/HDL Ratio: 3.9 (calc) (ref <5.0)
Triglycerides: 113 mg/dL (ref <150)

## 2023-02-24 LAB — URINALYSIS, ROUTINE W REFLEX MICROSCOPIC
Bilirubin Urine: NEGATIVE
Glucose, UA: NEGATIVE
Hgb urine dipstick: NEGATIVE
Ketones, ur: NEGATIVE
Leukocytes,Ua: NEGATIVE
Nitrite: NEGATIVE
Protein, ur: NEGATIVE
Specific Gravity, Urine: 1.023 (ref 1.001–1.035)
pH: 5.5 (ref 5.0–8.0)

## 2023-02-24 LAB — MICROALBUMIN / CREATININE URINE RATIO
Creatinine, Urine: 216 mg/dL (ref 20–275)
Microalb Creat Ratio: 1 mg/g{creat} (ref <30)
Microalb, Ur: 0.3 mg/dL

## 2023-02-24 LAB — HEMOGLOBIN A1C W/OUT EAG: Hgb A1c MFr Bld: 6.6 %{Hb} — ABNORMAL HIGH (ref <5.7)

## 2023-02-24 LAB — MAGNESIUM: Magnesium: 2.1 mg/dL (ref 1.5–2.5)

## 2023-02-24 LAB — TSH: TSH: 0.67 m[IU]/L (ref 0.40–4.50)

## 2023-02-24 LAB — VITAMIN D 25 HYDROXY (VIT D DEFICIENCY, FRACTURES): Vit D, 25-Hydroxy: 36 ng/mL (ref 30–100)

## 2023-06-07 ENCOUNTER — Ambulatory Visit: Payer: Self-pay | Admitting: Nurse Practitioner

## 2023-06-09 ENCOUNTER — Other Ambulatory Visit: Payer: Self-pay | Admitting: Nurse Practitioner

## 2023-06-09 DIAGNOSIS — E785 Hyperlipidemia, unspecified: Secondary | ICD-10-CM

## 2023-07-29 ENCOUNTER — Encounter: Payer: Self-pay | Admitting: Family Medicine

## 2023-07-29 ENCOUNTER — Ambulatory Visit: Payer: 59 | Admitting: Family Medicine

## 2023-07-29 VITALS — BP 130/72 | HR 78 | Temp 98.5°F | Ht 63.0 in | Wt 245.6 lb

## 2023-07-29 DIAGNOSIS — G43809 Other migraine, not intractable, without status migrainosus: Secondary | ICD-10-CM

## 2023-07-29 DIAGNOSIS — Z7984 Long term (current) use of oral hypoglycemic drugs: Secondary | ICD-10-CM

## 2023-07-29 DIAGNOSIS — E1169 Type 2 diabetes mellitus with other specified complication: Secondary | ICD-10-CM

## 2023-07-29 DIAGNOSIS — E785 Hyperlipidemia, unspecified: Secondary | ICD-10-CM

## 2023-07-29 DIAGNOSIS — E559 Vitamin D deficiency, unspecified: Secondary | ICD-10-CM

## 2023-07-29 DIAGNOSIS — E0590 Thyrotoxicosis, unspecified without thyrotoxic crisis or storm: Secondary | ICD-10-CM | POA: Diagnosis not present

## 2023-07-29 DIAGNOSIS — I10 Essential (primary) hypertension: Secondary | ICD-10-CM | POA: Diagnosis not present

## 2023-07-29 DIAGNOSIS — B369 Superficial mycosis, unspecified: Secondary | ICD-10-CM

## 2023-07-29 DIAGNOSIS — Z79899 Other long term (current) drug therapy: Secondary | ICD-10-CM

## 2023-07-29 LAB — CBC WITH DIFFERENTIAL/PLATELET
Basophils Absolute: 0.1 10*3/uL (ref 0.0–0.1)
Basophils Relative: 1 % (ref 0.0–3.0)
Eosinophils Absolute: 0.4 10*3/uL (ref 0.0–0.7)
Eosinophils Relative: 6.8 % — ABNORMAL HIGH (ref 0.0–5.0)
HCT: 41 % (ref 36.0–46.0)
Hemoglobin: 13.8 g/dL (ref 12.0–15.0)
Lymphocytes Relative: 38.2 % (ref 12.0–46.0)
Lymphs Abs: 2.5 10*3/uL (ref 0.7–4.0)
MCHC: 33.7 g/dL (ref 30.0–36.0)
MCV: 83.3 fl (ref 78.0–100.0)
Monocytes Absolute: 0.4 10*3/uL (ref 0.1–1.0)
Monocytes Relative: 6.3 % (ref 3.0–12.0)
Neutro Abs: 3.1 10*3/uL (ref 1.4–7.7)
Neutrophils Relative %: 47.7 % (ref 43.0–77.0)
Platelets: 311 10*3/uL (ref 150.0–400.0)
RBC: 4.92 Mil/uL (ref 3.87–5.11)
RDW: 14.2 % (ref 11.5–15.5)
WBC: 6.4 10*3/uL (ref 4.0–10.5)

## 2023-07-29 LAB — COMPREHENSIVE METABOLIC PANEL WITH GFR
ALT: 14 U/L (ref 0–35)
AST: 13 U/L (ref 0–37)
Albumin: 4.5 g/dL (ref 3.5–5.2)
Alkaline Phosphatase: 73 U/L (ref 39–117)
BUN: 18 mg/dL (ref 6–23)
CO2: 27 meq/L (ref 19–32)
Calcium: 9.8 mg/dL (ref 8.4–10.5)
Chloride: 104 meq/L (ref 96–112)
Creatinine, Ser: 0.98 mg/dL (ref 0.40–1.20)
GFR: 62.38 mL/min (ref >60.00)
Glucose, Bld: 96 mg/dL (ref 70–99)
Potassium: 4 meq/L (ref 3.5–5.1)
Sodium: 140 meq/L (ref 135–145)
Total Bilirubin: 0.4 mg/dL (ref 0.2–1.2)
Total Protein: 7.9 g/dL (ref 6.0–8.3)

## 2023-07-29 LAB — LIPID PANEL
Cholesterol: 148 mg/dL (ref 0–200)
HDL: 57.2 mg/dL (ref >39.00)
LDL Cholesterol: 79 mg/dL (ref 0–99)
NonHDL: 90.96
Total CHOL/HDL Ratio: 3
Triglycerides: 59 mg/dL (ref 0.0–149.0)
VLDL: 11.8 mg/dL (ref 0.0–40.0)

## 2023-07-29 LAB — TSH: TSH: 0.56 u[IU]/mL (ref 0.35–5.50)

## 2023-07-29 MED ORDER — RYBELSUS 3 MG PO TABS: 3.0000 mg | ORAL_TABLET | Freq: Every day | ORAL | 0 refills | Status: DC

## 2023-07-29 MED ORDER — RYBELSUS 7 MG PO TABS: 7.0000 mg | ORAL_TABLET | Freq: Every day | ORAL | 0 refills | Status: DC

## 2023-07-29 MED ORDER — CLOTRIMAZOLE-BETAMETHASONE 1-0.05 % EX CREA: 1.0000 | TOPICAL_CREAM | Freq: Every day | CUTANEOUS | 0 refills | Status: AC

## 2023-07-29 NOTE — Patient Instructions (Addendum)
 Welcome to [facility]!  Thank you for choosing [place] for your Primary Care needs.   We offer in person and video appointments for your convenience. You may call our office to schedule appointments, or you may schedule appointments with me through MyChart.   The best way to get in contact with me is via MyChart message. This will get to me faster than a phone call, unless there is an emergency, then please call 911.  The lab is located downstairs in the Sports Medicine building, we also have xray available there.   I have sent in Lotrisone cream for you to use twice a day to the affected area. May use Vaseline with this.   I have sent in rybelsus for you to start with 3mg  once daily on an empty stomach.   If this is tolerated well, may increase to 7mg  the next month.   We are checking labs today, will be in contact with any results that require further attention

## 2023-07-29 NOTE — Progress Notes (Signed)
 New Patient Office Visit  Subjective    Patient ID: Laura Branch, female    DOB: December 09, 1960  Age: 63 y.o. MRN: [id number]  CC:  Chief Complaint  Patient presents with   Establish Care    HPI Laura Branch presents to establish care today. Reports that she has a rash to the right shoulder, into her groin area.  Has been using Vaseline to the area.  States this helps moisturize and helps with itching. Describes her as is red, raised, itchy to her left shoulder. Describes rash to the groin as raised and itchy. Denies previous symptoms.  States rash has been present for about 3 to 4 months. Would also like to discuss her weight.  States that her weight fluctuates.  Has been treated with GLP-1 in the past, did not like doing injections.  States this worked well for her for a while, then she plateaued. Denies history of any thyroid cancer in her family, denies personal history of any thyroid cancers. Reports compliance with medication regimen.  Denies other concerns today.  Outpatient Encounter Medications as of 07/29/2023  Medication Sig   acetaminophen (TYLENOL) 500 MG tablet Take 500 mg by mouth every 6 (six) hours as needed. 2 in the am   albuterol (PROAIR HFA) 108 (90 Base) MCG/ACT inhaler Inhale 2 puffs into the lungs every 6 (six) hours as needed for wheezing or shortness of breath.   azelastine (ASTELIN) 137 MCG/SPRAY nasal spray Place 2 sprays into both nostrils 2 (two) times daily. Use in each nostril as directed   buPROPion (WELLBUTRIN XL) 150 MG 24 hr tablet TAKE 1 TABLET BY MOUTH EVERY DAY IN THE MORNING   cetirizine (ZYRTEC) 10 MG tablet Take 10 mg by mouth daily.   cholecalciferol (VITAMIN D) 1000 UNITS tablet Take 10,000 Units by mouth daily.   clotrimazole-betamethasone (LOTRISONE) cream Apply 1 Application topically daily.   cyclobenzaprine (FLEXERIL) 5 MG tablet Take 1 tablet (5 mg total) by mouth 3 (three) times daily as needed for muscle spasms.   ezetimibe  (ZETIA) 10 MG tablet TAKE 1 TABLET BY MOUTH EVERY DAY   hydrochlorothiazide (HYDRODIURIL) 12.5 MG tablet TAKE 1 TABLET DAILY FOR BLOOD PRESSURE AND FLUID   meclizine (ANTIVERT) 25 MG tablet TAKE 1/2 (HALF) - 1 TABLET UP TO 3 TIMES DAILY FOR MOTION SICKNESS/DIZZINESS   metFORMIN (GLUCOPHAGE) 500 MG tablet Take 1 tablet (500 mg total) by mouth 2 (two) times daily with a meal.   Semaglutide (RYBELSUS) 3 MG TABS Take 1 tablet (3 mg total) by mouth daily.   Semaglutide (RYBELSUS) 7 MG TABS Take 1 tablet (7 mg total) by mouth daily.   simvastatin (ZOCOR) 40 MG tablet TAKE 1 TABLET BY MOUTH EVERY DAY   vitamin B-12 (CYANOCOBALAMIN) 500 MCG tablet Take 500 mcg by mouth daily.   No facility-administered encounter medications on file as of 07/29/2023.    Past Medical History:  Diagnosis Date   Allergy    Depression    Hyperlipidemia    Hypertension    Echo 2011 normal EF, trace MR   Migraines    Obesity    Prediabetes    Type 2 diabetes mellitus with hyperlipidemia (HCC) 10/15/2019   Vitamin D deficiency     Past Surgical History:  Procedure Laterality Date   ABDOMINAL HYSTERECTOMY     STAPEDECTOMY Right 2000    Family History  Problem Relation Age of Onset   Hypertension Mother    Asthma Mother    Arthritis Mother  Diabetes Father    Hyperlipidemia Father    Hypertension Father    Hypertension Sister    Diabetes Daughter    Cancer Maternal Grandmother 5       colon   Cancer Maternal Grandfather 56       colon    Social History   Socioeconomic History   Marital status: Married    Spouse name: Not on file   Number of children: Not on file   Years of education: Not on file   Highest education level: Not on file  Occupational History   Not on file  Tobacco Use   Smoking status: Never   Smokeless tobacco: Never  Substance and Sexual Activity   Alcohol use: No   Drug use: No   Sexual activity: Not on file  Other Topics Concern   Not on file  Social History  Narrative   Not on file   Social Drivers of Health   Financial Resource Strain: Not on file  Food Insecurity: Not on file  Transportation Needs: Not on file  Physical Activity: Not on file  Stress: Not on file  Social Connections: Not on file  Intimate Partner Violence: Not on file    ROS Per HPI      Objective    BP 130/72 (BP Location: Left Arm, Patient Position: Sitting)   Pulse 78   Temp 98.5 F (36.9 C) (Temporal)   Ht 5\' 3"  (1.6 m)   Wt 245 lb 9.6 oz (111.4 kg)   SpO2 100%   BMI 43.51 kg/m   Physical Exam Vitals and nursing note reviewed.  Constitutional:      General: She is not in acute distress.    Appearance: Normal appearance. She is obese.  HENT:     Head: Normocephalic and atraumatic.     Right Ear: External ear normal.     Left Ear: External ear normal.     Nose: Nose normal.     Mouth/Throat:     Mouth: Mucous membranes are moist.     Pharynx: Oropharynx is clear.  Eyes:     Extraocular Movements: Extraocular movements intact.     Pupils: Pupils are equal, round, and reactive to light.  Cardiovascular:     Rate and Rhythm: Normal rate and regular rhythm.     Pulses: Normal pulses.     Heart sounds: Normal heart sounds.  Pulmonary:     Effort: Pulmonary effort is normal. No respiratory distress.     Breath sounds: Normal breath sounds. No wheezing, rhonchi or rales.  Musculoskeletal:        General: Normal range of motion.     Cervical back: Normal range of motion.     Right lower leg: No edema.     Left lower leg: No edema.  Lymphadenopathy:     Cervical: No cervical adenopathy.  Skin:    Findings: Rash present.          Comments: Area of erythematous, maculopapular rash to an erythematous base. No bleeding, no discharge. Rash to the mons pubis with satellite lesions, consistent with fungal dermatitis  Neurological:     General: No focal deficit present.     Mental Status: She is alert and oriented to person, place, and time.   Psychiatric:        Mood and Affect: Mood normal.        Thought Content: Thought content normal.        Assessment & Plan:  Primary hypertension -     CBC with Differential/Platelet  Type 2 diabetes mellitus with hyperlipidemia (HCC) -     Rybelsus; Take 1 tablet (3 mg total) by mouth daily.  Dispense: 30 tablet; Refill: 0 -     Rybelsus; Take 1 tablet (7 mg total) by mouth daily.  Dispense: 30 tablet; Refill: 0 -     CBC with Differential/Platelet -     Comprehensive metabolic panel with GFR -     Lipid panel -     Hemoglobin A1c  Obesity, morbid, BMI 40.0-49.9 (HCC) -     Rybelsus; Take 1 tablet (3 mg total) by mouth daily.  Dispense: 30 tablet; Refill: 0 -     Rybelsus; Take 1 tablet (7 mg total) by mouth daily.  Dispense: 30 tablet; Refill: 0  Hyperthyroidism -     TSH  Morbid obesity (HCC) -     Rybelsus; Take 1 tablet (3 mg total) by mouth daily.  Dispense: 30 tablet; Refill: 0  Medication management -     CBC with Differential/Platelet  Fungal dermatitis -     Clotrimazole-Betamethasone; Apply 1 Application topically daily.  Dispense: 30 g; Refill: 0     Return in about 3 months (around 10/29/2023) for meds, labs.   Sherald Barge, FNP

## 2023-07-30 LAB — HEMOGLOBIN A1C: Hgb A1c MFr Bld: 6.5 % (ref 4.6–6.5)

## 2023-08-01 ENCOUNTER — Encounter: Payer: Self-pay | Admitting: Family Medicine

## 2023-08-29 ENCOUNTER — Other Ambulatory Visit: Payer: Self-pay | Admitting: Family Medicine

## 2023-08-29 DIAGNOSIS — E1169 Type 2 diabetes mellitus with other specified complication: Secondary | ICD-10-CM

## 2023-09-27 ENCOUNTER — Other Ambulatory Visit: Payer: Self-pay | Admitting: Family Medicine

## 2023-09-27 DIAGNOSIS — E1169 Type 2 diabetes mellitus with other specified complication: Secondary | ICD-10-CM

## 2023-10-29 DIAGNOSIS — F331 Major depressive disorder, recurrent, moderate: Secondary | ICD-10-CM | POA: Insufficient documentation

## 2023-10-29 NOTE — Progress Notes (Unsigned)
   Established Patient Office Visit  Subjective   Patient ID: Laura Branch, female    DOB: May 02, 1961  Age: 63 y.o. MRN: [id number]  No chief complaint on file.   HPI Patient presents today for 3 month f/u for medication management. Reports compliance with medication regimen. Denies the need for refills. Not fasting today. Denies other concerns. Medical history as outlined below.  ROS Per HPI    Objective:     There were no vitals taken for this visit.  Physical Exam Vitals and nursing note reviewed.  Constitutional:      General: She is not in acute distress.    Appearance: Normal appearance. She is obese.  HENT:     Head: Normocephalic and atraumatic.     Right Ear: External ear normal.     Left Ear: External ear normal.     Nose: Nose normal.     Mouth/Throat:     Mouth: Mucous membranes are moist.     Pharynx: Oropharynx is clear.   Eyes:     Extraocular Movements: Extraocular movements intact.     Pupils: Pupils are equal, round, and reactive to light.    Cardiovascular:     Rate and Rhythm: Normal rate and regular rhythm.     Pulses: Normal pulses.     Heart sounds: Normal heart sounds.  Pulmonary:     Effort: Pulmonary effort is normal. No respiratory distress.     Breath sounds: Normal breath sounds. No wheezing, rhonchi or rales.   Musculoskeletal:        General: Normal range of motion.     Cervical back: Normal range of motion.     Right lower leg: No edema.     Left lower leg: No edema.  Lymphadenopathy:     Cervical: No cervical adenopathy.   Skin:    Findings: Rash present.         Comments: Area of erythematous, maculopapular rash to an erythematous base. No bleeding, no discharge. Rash to the mons pubis with satellite lesions, consistent with fungal dermatitis   Neurological:     General: No focal deficit present.     Mental Status: She is alert and oriented to person, place, and time.   Psychiatric:        Mood and Affect:  Mood normal.        Thought Content: Thought content normal.    No results found for any visits on 10/31/23.   The 10-year ASCVD risk score (Arnett DK, et al., 2019) is: 13%    Assessment & Plan:   There are no diagnoses linked to this encounter.   No follow-ups on file.    Corean LITTIE Ku, FNP

## 2023-10-30 ENCOUNTER — Other Ambulatory Visit: Payer: Self-pay | Admitting: Family Medicine

## 2023-10-30 DIAGNOSIS — E1169 Type 2 diabetes mellitus with other specified complication: Secondary | ICD-10-CM

## 2023-10-31 ENCOUNTER — Ambulatory Visit (INDEPENDENT_AMBULATORY_CARE_PROVIDER_SITE_OTHER): Admitting: Family Medicine

## 2023-10-31 ENCOUNTER — Encounter: Payer: Self-pay | Admitting: Family Medicine

## 2023-10-31 VITALS — BP 168/96 | HR 77 | Temp 98.2°F | Ht 63.0 in | Wt 227.4 lb

## 2023-10-31 DIAGNOSIS — E782 Mixed hyperlipidemia: Secondary | ICD-10-CM | POA: Diagnosis not present

## 2023-10-31 DIAGNOSIS — F331 Major depressive disorder, recurrent, moderate: Secondary | ICD-10-CM | POA: Diagnosis not present

## 2023-10-31 DIAGNOSIS — R011 Cardiac murmur, unspecified: Secondary | ICD-10-CM | POA: Diagnosis not present

## 2023-10-31 DIAGNOSIS — Z7984 Long term (current) use of oral hypoglycemic drugs: Secondary | ICD-10-CM

## 2023-10-31 DIAGNOSIS — E1169 Type 2 diabetes mellitus with other specified complication: Secondary | ICD-10-CM

## 2023-10-31 DIAGNOSIS — E66813 Obesity, class 3: Secondary | ICD-10-CM

## 2023-10-31 DIAGNOSIS — E559 Vitamin D deficiency, unspecified: Secondary | ICD-10-CM

## 2023-10-31 DIAGNOSIS — I10 Essential (primary) hypertension: Secondary | ICD-10-CM

## 2023-10-31 DIAGNOSIS — Z6841 Body Mass Index (BMI) 40.0 and over, adult: Secondary | ICD-10-CM | POA: Insufficient documentation

## 2023-10-31 LAB — CBC WITH DIFFERENTIAL/PLATELET
Basophils Absolute: 0 10*3/uL (ref 0.0–0.1)
Basophils Relative: 0.8 % (ref 0.0–3.0)
Eosinophils Absolute: 0.3 10*3/uL (ref 0.0–0.7)
Eosinophils Relative: 5.8 % — ABNORMAL HIGH (ref 0.0–5.0)
HCT: 40.7 % (ref 36.0–46.0)
Hemoglobin: 13.8 g/dL (ref 12.0–15.0)
Lymphocytes Relative: 38.1 % (ref 12.0–46.0)
Lymphs Abs: 1.9 10*3/uL (ref 0.7–4.0)
MCHC: 33.8 g/dL (ref 30.0–36.0)
MCV: 81.3 fl (ref 78.0–100.0)
Monocytes Absolute: 0.4 10*3/uL (ref 0.1–1.0)
Monocytes Relative: 7 % (ref 3.0–12.0)
Neutro Abs: 2.4 10*3/uL (ref 1.4–7.7)
Neutrophils Relative %: 48.3 % (ref 43.0–77.0)
Platelets: 324 10*3/uL (ref 150.0–400.0)
RBC: 5 Mil/uL (ref 3.87–5.11)
RDW: 14.4 % (ref 11.5–15.5)
WBC: 5 10*3/uL (ref 4.0–10.5)

## 2023-10-31 LAB — LIPID PANEL
Cholesterol: 202 mg/dL — ABNORMAL HIGH (ref 0–200)
HDL: 45.3 mg/dL (ref >39.00)
LDL Cholesterol: 143 mg/dL — ABNORMAL HIGH (ref 0–99)
NonHDL: 156.79
Total CHOL/HDL Ratio: 4
Triglycerides: 67 mg/dL (ref 0.0–149.0)
VLDL: 13.4 mg/dL (ref 0.0–40.0)

## 2023-10-31 LAB — HEMOGLOBIN A1C: Hgb A1c MFr Bld: 5.9 % (ref 4.6–6.5)

## 2023-10-31 LAB — COMPREHENSIVE METABOLIC PANEL WITH GFR
ALT: 13 U/L (ref 0–35)
AST: 12 U/L (ref 0–37)
Albumin: 4.3 g/dL (ref 3.5–5.2)
Alkaline Phosphatase: 70 U/L (ref 39–117)
BUN: 15 mg/dL (ref 6–23)
CO2: 27 meq/L (ref 19–32)
Calcium: 9.6 mg/dL (ref 8.4–10.5)
Chloride: 101 meq/L (ref 96–112)
Creatinine, Ser: 1.19 mg/dL (ref 0.40–1.20)
GFR: 49.33 mL/min — ABNORMAL LOW (ref >60.00)
Glucose, Bld: 95 mg/dL (ref 70–99)
Potassium: 3.4 meq/L — ABNORMAL LOW (ref 3.5–5.1)
Sodium: 137 meq/L (ref 135–145)
Total Bilirubin: 0.5 mg/dL (ref 0.2–1.2)
Total Protein: 7.9 g/dL (ref 6.0–8.3)

## 2023-10-31 LAB — VITAMIN D 25 HYDROXY (VIT D DEFICIENCY, FRACTURES): VITD: 100 ng/mL (ref 30.00–100.00)

## 2023-10-31 MED ORDER — HYDROCHLOROTHIAZIDE 12.5 MG PO TABS
ORAL_TABLET | ORAL | 2 refills | Status: AC
Start: 2023-10-31 — End: ?

## 2023-10-31 MED ORDER — RYBELSUS 7 MG PO TABS: 7.0000 mg | ORAL_TABLET | Freq: Every day | ORAL | 1 refills | Status: DC

## 2023-10-31 MED ORDER — SIMVASTATIN 40 MG PO TABS: 40.0000 mg | ORAL_TABLET | Freq: Every day | ORAL | 1 refills | Status: DC

## 2023-10-31 MED ORDER — BUPROPION HCL ER (XL) 150 MG PO TB24: 150.0000 mg | ORAL_TABLET | Freq: Every day | ORAL | 3 refills | Status: AC

## 2023-10-31 NOTE — Patient Instructions (Addendum)
 We are checking labs today, will be in contact with any results that require further attention.  Continue current medication regimen.  Sent in refills.  Follow up with me in about 3 months for labs and medication management, sooner if needed.

## 2023-10-31 NOTE — Assessment & Plan Note (Signed)
 Continue efforts in healthy diet and activity level

## 2023-11-03 ENCOUNTER — Ambulatory Visit: Payer: Self-pay | Admitting: Family Medicine

## 2024-01-31 ENCOUNTER — Ambulatory Visit: Admitting: Family Medicine

## 2024-01-31 ENCOUNTER — Encounter: Payer: Self-pay | Admitting: Family Medicine

## 2024-01-31 ENCOUNTER — Ambulatory Visit: Payer: Self-pay | Admitting: Family Medicine

## 2024-01-31 VITALS — BP 138/86 | HR 78 | Temp 98.7°F | Ht 63.0 in | Wt 217.4 lb

## 2024-01-31 DIAGNOSIS — I10 Essential (primary) hypertension: Secondary | ICD-10-CM

## 2024-01-31 DIAGNOSIS — Z7984 Long term (current) use of oral hypoglycemic drugs: Secondary | ICD-10-CM

## 2024-01-31 DIAGNOSIS — Z79899 Other long term (current) drug therapy: Secondary | ICD-10-CM | POA: Diagnosis not present

## 2024-01-31 DIAGNOSIS — E1165 Type 2 diabetes mellitus with hyperglycemia: Secondary | ICD-10-CM | POA: Diagnosis not present

## 2024-01-31 DIAGNOSIS — E782 Mixed hyperlipidemia: Secondary | ICD-10-CM

## 2024-01-31 DIAGNOSIS — Z23 Encounter for immunization: Secondary | ICD-10-CM

## 2024-01-31 LAB — COMPREHENSIVE METABOLIC PANEL WITH GFR
ALT: 11 U/L (ref 0–35)
AST: 14 U/L (ref 0–37)
Albumin: 4.3 g/dL (ref 3.5–5.2)
Alkaline Phosphatase: 65 U/L (ref 39–117)
BUN: 11 mg/dL (ref 6–23)
CO2: 25 meq/L (ref 19–32)
Calcium: 9.8 mg/dL (ref 8.4–10.5)
Chloride: 105 meq/L (ref 96–112)
Creatinine, Ser: 1.01 mg/dL (ref 0.40–1.20)
GFR: 59.95 mL/min — ABNORMAL LOW (ref >60.00)
Glucose, Bld: 106 mg/dL — ABNORMAL HIGH (ref 70–99)
Potassium: 3.8 meq/L (ref 3.5–5.1)
Sodium: 138 meq/L (ref 135–145)
Total Bilirubin: 0.4 mg/dL (ref 0.2–1.2)
Total Protein: 7.6 g/dL (ref 6.0–8.3)

## 2024-01-31 LAB — LIPID PANEL
Cholesterol: 189 mg/dL (ref 0–200)
HDL: 50.2 mg/dL (ref >39.00)
LDL Cholesterol: 125 mg/dL — ABNORMAL HIGH (ref 0–99)
NonHDL: 138.88
Total CHOL/HDL Ratio: 4
Triglycerides: 67 mg/dL (ref 0.0–149.0)
VLDL: 13.4 mg/dL (ref 0.0–40.0)

## 2024-01-31 LAB — MICROALBUMIN / CREATININE URINE RATIO
Creatinine,U: 222.3 mg/dL
Microalb Creat Ratio: 3.4 mg/g (ref 0.0–30.0)
Microalb, Ur: 0.8 mg/dL (ref 0.0–1.9)

## 2024-01-31 LAB — HEMOGLOBIN A1C: Hgb A1c MFr Bld: 5.9 % (ref 4.6–6.5)

## 2024-01-31 LAB — TSH: TSH: 0.47 u[IU]/mL (ref 0.35–5.50)

## 2024-01-31 NOTE — Progress Notes (Signed)
 Established Patient Office Visit  Subjective:     Patient ID: Laura Branch, female    DOB: Sep 27, 1960, 63 y.o.   MRN: [id number]  Chief Complaint  Patient presents with   Medical Management of Chronic Issues    3 Month follow up. Repeat labs (cholesterol). Patient notes slight abdominal pain after taking simvastatin     HPI  Discussed the use of AI scribe software for clinical note transcription with the patient, who gave verbal consent to proceed.  History of Present Illness Laura Branch is a 63 year old female who presents for a follow-up visit to recheck labs and discuss medication side effects.  Gastrointestinal symptoms - Stomach discomfort at night, occurring about twice a week - Nocturnal gastrointestinal symptoms requiring bathroom use - Symptoms possibly related to simvastatin  or dietary factors - Lactose intolerance, especially with cheese and dairy products - Uncertainty if symptoms are related to carbohydrate-heavy meals, such as rice  Medication side effects - Stomach discomfort attributed to simvastatin  - Recent improvement in medication adherence with simvastatin  after prior inconsistency - Currently taking Rybelsus   Metabolic and weight concerns - Last hemoglobin A1c was 5.9 - Waking up hungry - weight loss, closer fitting looser - Considering closer monitoring of carbohydrate intake      ROS Per HPI      Objective:    BP 138/86   Pulse 78   Temp 98.7 F (37.1 C)   Ht 5' 3 (1.6 m)   Wt 217 lb 6.4 oz (98.6 kg)   SpO2 98%   BMI 38.51 kg/m    Physical Exam Vitals and nursing note reviewed.  Constitutional:      General: She is not in acute distress.    Appearance: Normal appearance. She is obese.  HENT:     Head: Normocephalic and atraumatic.     Right Ear: External ear normal.     Left Ear: External ear normal.     Nose: Nose normal.     Mouth/Throat:     Mouth: Mucous membranes are moist.     Pharynx: Oropharynx is clear.   Eyes:     Extraocular Movements: Extraocular movements intact.     Pupils: Pupils are equal, round, and reactive to light.  Cardiovascular:     Rate and Rhythm: Normal rate and regular rhythm.     Pulses: Normal pulses.     Heart sounds: Normal heart sounds.  Pulmonary:     Effort: Pulmonary effort is normal. No respiratory distress.     Breath sounds: Normal breath sounds. No wheezing, rhonchi or rales.  Musculoskeletal:        General: Normal range of motion.     Cervical back: Normal range of motion.     Right lower leg: No edema.     Left lower leg: No edema.  Lymphadenopathy:     Cervical: No cervical adenopathy.  Neurological:     General: No focal deficit present.     Mental Status: She is alert and oriented to person, place, and time.  Psychiatric:        Mood and Affect: Mood normal.        Thought Content: Thought content normal.     No results found for any visits on 01/31/24.  The 10-year ASCVD risk score (Arnett DK, et al., 2019) is: 22.1%  BP Readings from Last 3 Encounters:  01/31/24 138/86  10/31/23 (!) 168/96  07/29/23 130/72   Wt Readings from Last 3 Encounters:  01/31/24 217 lb 6.4  oz (98.6 kg)  10/31/23 227 lb 6.4 oz (103.1 kg)  07/29/23 245 lb 9.6 oz (111.4 kg)      Last CBC Lab Results  Component Value Date   WBC 5.0 10/31/2023   HGB 13.8 10/31/2023   HCT 40.7 10/31/2023   MCV 81.3 10/31/2023   MCH 28.2 02/23/2023   RDW 14.4 10/31/2023   PLT 324.0 10/31/2023   Last metabolic panel Lab Results  Component Value Date   GLUCOSE 95 10/31/2023   NA 137 10/31/2023   K 3.4 (L) 10/31/2023   CL 101 10/31/2023   CO2 27 10/31/2023   BUN 15 10/31/2023   CREATININE 1.19 10/31/2023   GFR 49.33 (L) 10/31/2023   CALCIUM 9.6 10/31/2023   PROT 7.9 10/31/2023   ALBUMIN 4.3 10/31/2023   BILITOT 0.5 10/31/2023   ALKPHOS 70 10/31/2023   AST 12 10/31/2023   ALT 13 10/31/2023   ANIONGAP 8 06/15/2014   Last lipids Lab Results  Component Value  Date   CHOL 202 (H) 10/31/2023   HDL 45.30 10/31/2023   LDLCALC 143 (H) 10/31/2023   TRIG 67.0 10/31/2023   CHOLHDL 4 10/31/2023   Last hemoglobin A1c Lab Results  Component Value Date   HGBA1C 5.9 10/31/2023   Last thyroid  functions Lab Results  Component Value Date   TSH 0.56 07/29/2023   Last vitamin D  Lab Results  Component Value Date   VD25OH 100.00 10/31/2023   Last vitamin B12 and Folate Lab Results  Component Value Date   VITAMINB12 221 07/21/2020         Assessment & Plan:   Assessment and Plan Assessment & Plan Type 2 diabetes mellitus with hyperglycemia, without long-term use of insulin  Managed with Rybelsus . A1c at 5.9, within target. Reports hunger and gastrointestinal symptoms, possibly Rybelsus -related. - Monitor A1c levels. - Instruct her to report persistent gastrointestinal symptoms or continued hunger. - Consider increasing Rybelsus  dosage if A1c levels rise.  Primary hypertension Blood pressure well-controlled. Last reading 168 mmHg, current satisfactory.  Mixed hyperlipidemia Managed with Simvastatin . Reports gastrointestinal discomfort, possibly Simvastatin -related. Discussed dietary habits and lactose intolerance as contributors. - Monitor cholesterol levels. - Advise her to track dietary intake and symptoms to identify potential triggers.  Morbid obesity due to excess calories Weight loss noted. Monitoring dietary intake. Discussed weight loss impact on feeling cold. - Encourage continued monitoring of dietary intake and weight.  Immunization due Due for flu shot, agreed to receive it. - Administer flu shot.  Medication management - Continue current medication regimen - Labs today, will dose adjust as needed     Orders Placed This Encounter  Procedures   Flu vaccine trivalent PF, 6mos and older(Flulaval,Afluria,Fluarix,Fluzone )   Comprehensive metabolic panel with GFR    Release to patient:   Immediate [1]   Hemoglobin A1c    Lipid panel   Microalbumin / creatinine urine ratio    Release to patient:   Immediate   TSH     No orders of the defined types were placed in this encounter.   Return in about 6 months (around 07/30/2024) for cpe.  Corean LITTIE Ku, FNP

## 2024-01-31 NOTE — Patient Instructions (Addendum)
 Continue current medication regimen.  We are checking labs today, will be in contact with any results that require further attention  We have given your flu vaccine today.   Follow-up with me in 6 mos for medication management, sooner if needed.

## 2024-02-06 MED ORDER — ROSUVASTATIN CALCIUM 40 MG PO TABS: 40.0000 mg | ORAL_TABLET | Freq: Every day | ORAL | 1 refills | Status: AC

## 2024-02-06 MED ORDER — RYBELSUS 14 MG PO TABS: 14.0000 mg | ORAL_TABLET | Freq: Every day | ORAL | 6 refills | Status: AC

## 2024-02-23 ENCOUNTER — Encounter: Payer: Self-pay | Admitting: Nurse Practitioner

## 2024-05-02 LAB — HM MAMMOGRAPHY

## 2024-06-07 ENCOUNTER — Other Ambulatory Visit: Payer: Self-pay | Admitting: Family Medicine

## 2024-06-07 DIAGNOSIS — E1169 Type 2 diabetes mellitus with other specified complication: Secondary | ICD-10-CM

## 2024-07-30 ENCOUNTER — Ambulatory Visit: Admitting: Family Medicine
